# Patient Record
Sex: Male | Born: 1935 | Race: White | Hispanic: No | Smoking: Never smoker
Health system: Southern US, Community
[De-identification: ages and names within clinical notes are randomized; demographics above are authoritative.]

## PROBLEM LIST (undated history)

## (undated) DIAGNOSIS — J449 Chronic obstructive pulmonary disease, unspecified: Secondary | ICD-10-CM

## (undated) DIAGNOSIS — F0280 Dementia in other diseases classified elsewhere without behavioral disturbance: Secondary | ICD-10-CM

## (undated) DIAGNOSIS — I10 Essential (primary) hypertension: Secondary | ICD-10-CM

## (undated) DIAGNOSIS — M1990 Unspecified osteoarthritis, unspecified site: Secondary | ICD-10-CM

## (undated) DIAGNOSIS — K219 Gastro-esophageal reflux disease without esophagitis: Secondary | ICD-10-CM

## (undated) DIAGNOSIS — Z96649 Presence of unspecified artificial hip joint: Secondary | ICD-10-CM

## (undated) HISTORY — PX: JOINT REPLACEMENT: SHX530

## (undated) HISTORY — PX: CHOLECYSTECTOMY: SHX55

## (undated) HISTORY — DX: Chronic obstructive pulmonary disease, unspecified: J44.9

## (undated) HISTORY — DX: Unspecified osteoarthritis, unspecified site: M19.90

## (undated) HISTORY — PX: CATARACT EXTRACTION, BILATERAL: SHX1313

## (undated) HISTORY — DX: Dementia in other diseases classified elsewhere, unspecified severity, without behavioral disturbance, psychotic disturbance, mood disturbance, and anxiety: F02.80

## (undated) HISTORY — DX: Essential (primary) hypertension: I10

## (undated) HISTORY — PX: EYE SURGERY: SHX253

## (undated) HISTORY — PX: TONSILLECTOMY: SUR1361

## (undated) HISTORY — PX: REPLACEMENT TOTAL KNEE: SUR1224

---

## 2001-03-26 ENCOUNTER — Encounter: Payer: Self-pay | Admitting: Emergency Medicine

## 2001-03-26 ENCOUNTER — Emergency Department (HOSPITAL_COMMUNITY): Admission: EM | Admit: 2001-03-26 | Discharge: 2001-03-26 | Payer: Self-pay | Admitting: Emergency Medicine

## 2002-06-14 ENCOUNTER — Emergency Department (HOSPITAL_COMMUNITY): Admission: EM | Admit: 2002-06-14 | Discharge: 2002-06-14 | Payer: Self-pay | Admitting: Emergency Medicine

## 2002-06-16 ENCOUNTER — Emergency Department (HOSPITAL_COMMUNITY): Admission: EM | Admit: 2002-06-16 | Discharge: 2002-06-16 | Payer: Self-pay | Admitting: Emergency Medicine

## 2003-12-08 ENCOUNTER — Ambulatory Visit (HOSPITAL_COMMUNITY): Admission: RE | Admit: 2003-12-08 | Discharge: 2003-12-08 | Payer: Self-pay | Admitting: Orthopedic Surgery

## 2004-01-15 ENCOUNTER — Emergency Department (HOSPITAL_COMMUNITY): Admission: EM | Admit: 2004-01-15 | Discharge: 2004-01-15 | Payer: Self-pay | Admitting: Emergency Medicine

## 2004-05-14 ENCOUNTER — Ambulatory Visit (HOSPITAL_COMMUNITY): Admission: RE | Admit: 2004-05-14 | Discharge: 2004-05-14 | Payer: Self-pay | Admitting: *Deleted

## 2004-05-14 ENCOUNTER — Encounter (INDEPENDENT_AMBULATORY_CARE_PROVIDER_SITE_OTHER): Payer: Self-pay | Admitting: Specialist

## 2005-05-31 ENCOUNTER — Encounter: Admission: RE | Admit: 2005-05-31 | Discharge: 2005-07-22 | Payer: Self-pay | Admitting: Sports Medicine

## 2005-07-20 ENCOUNTER — Ambulatory Visit (HOSPITAL_COMMUNITY): Admission: RE | Admit: 2005-07-20 | Discharge: 2005-07-20 | Payer: Self-pay | Admitting: *Deleted

## 2005-07-20 ENCOUNTER — Encounter (INDEPENDENT_AMBULATORY_CARE_PROVIDER_SITE_OTHER): Payer: Self-pay | Admitting: *Deleted

## 2007-08-14 ENCOUNTER — Encounter (INDEPENDENT_AMBULATORY_CARE_PROVIDER_SITE_OTHER): Payer: Self-pay | Admitting: *Deleted

## 2007-08-14 ENCOUNTER — Ambulatory Visit (HOSPITAL_COMMUNITY): Admission: RE | Admit: 2007-08-14 | Discharge: 2007-08-14 | Payer: Self-pay | Admitting: *Deleted

## 2007-10-23 ENCOUNTER — Ambulatory Visit (HOSPITAL_COMMUNITY): Admission: RE | Admit: 2007-10-23 | Discharge: 2007-10-23 | Payer: Self-pay | Admitting: Internal Medicine

## 2007-11-12 ENCOUNTER — Ambulatory Visit (HOSPITAL_COMMUNITY): Admission: RE | Admit: 2007-11-12 | Discharge: 2007-11-12 | Payer: Self-pay | Admitting: *Deleted

## 2008-09-29 ENCOUNTER — Encounter: Admission: RE | Admit: 2008-09-29 | Discharge: 2008-09-29 | Payer: Self-pay | Admitting: Internal Medicine

## 2008-11-18 ENCOUNTER — Ambulatory Visit (HOSPITAL_COMMUNITY): Admission: RE | Admit: 2008-11-18 | Discharge: 2008-11-18 | Payer: Self-pay | Admitting: General Surgery

## 2008-11-18 ENCOUNTER — Encounter (INDEPENDENT_AMBULATORY_CARE_PROVIDER_SITE_OTHER): Payer: Self-pay | Admitting: General Surgery

## 2011-02-01 LAB — COMPREHENSIVE METABOLIC PANEL
AST: 23 U/L (ref 0–37)
Albumin: 3.5 g/dL (ref 3.5–5.2)
BUN: 6 mg/dL (ref 6–23)
Calcium: 9.2 mg/dL (ref 8.4–10.5)
Chloride: 106 mEq/L (ref 96–112)
Creatinine, Ser: 1.02 mg/dL (ref 0.4–1.5)
GFR calc Af Amer: >60 mL/min (ref >60)
Total Bilirubin: 0.8 mg/dL (ref 0.3–1.2)
Total Protein: 6.3 g/dL (ref 6.0–8.3)

## 2011-02-01 LAB — CBC
MCV: 92.2 fL (ref 78.0–100.0)
Platelets: 282 10*3/uL (ref 150–400)
RDW: 13 % (ref 11.5–15.5)
WBC: 5.9 10*3/uL (ref 4.0–10.5)

## 2011-02-01 LAB — DIFFERENTIAL
Eosinophils Relative: 5 % (ref 0–5)
Lymphocytes Relative: 26 % (ref 12–46)
Lymphs Abs: 1.5 10*3/uL (ref 0.7–4.0)
Monocytes Absolute: 0.8 10*3/uL (ref 0.1–1.0)
Monocytes Relative: 13 % — ABNORMAL HIGH (ref 3–12)
Neutro Abs: 3.3 10*3/uL (ref 1.7–7.7)

## 2011-03-01 NOTE — Op Note (Signed)
Raymond Cuevas, Raymond Cuevas             ACCOUNT NO.:  [id number]   MEDICAL RECORD NO.:  [id number]          PATIENT TYPE:  AMB   LOCATION:  ENDO                         FACILITY:  [facility]   PHYSICIAN:  Georgiana Spinner, M.D.    DATE OF BIRTH:  07-19-1934   DATE OF PROCEDURE:  08/14/2007  DATE OF DISCHARGE:                               OPERATIVE REPORT   PROCEDURES:  Upper endoscopy.   INDICATIONS:  Gastroesophageal reflux disease.   ANESTHESIA:  Fentanyl 75 mcg, Versed 5 mg.   PROCEDURE:  Also ampicillin 2 grams and gentamicin 80 mg were given  prior to the procedure.  With the patient mildly sedated in the left  lateral decubitus position the Pentax videoscopic endoscope was inserted  in mouth and passed under direct vision through the esophagus which  appeared normal until we reached distal esophagus there is appeared to  be a [address] of Barrett's photographed and biopsied.  We entered  into the stomach fundus, body, antrum, duodenal bulb, second portion  duodenum were visualized and appeared normal.  From this point the  endoscope was slowly withdrawn taking circumferential views of duodenal  mucosa until the endoscope been pulled back into stomach, placed in  retroflexion to view the stomach from below.  The endoscope was  straightened and withdrawn taking circumferential views remaining  gastric and esophageal mucosa.  The patient's vital signs, pulse  oximeter remained stable.  The patient tolerated procedure well without  apparent complications.   FINDINGS:  Question of Barrett's esophagus biopsied.  Await biopsy  report.  The patient will call me for results and follow-up with me as  an outpatient.  Proceed to colonoscopy as planned.           ______________________________  Georgiana Spinner, M.D.     GMO/MEDQ  D:  08/14/2007  T:  08/14/2007  Job:  [id number]

## 2011-03-01 NOTE — Op Note (Signed)
NAMEABEM, SHADDIX             ACCOUNT NO.:  [id number]   MEDICAL RECORD NO.:  [id number]          PATIENT TYPE:  AMB   LOCATION:  ENDO                         [facility]ACILITY:  [facility]   PHYSICIAN:  Georgiana Spinner, M.D.    DATE OF BIRTH:  11/03/1933   DATE OF PROCEDURE:  11/12/2007  DATE OF DISCHARGE:                               OPERATIVE REPORT   PROCEDURE:  Upper endoscopy with Savary dilation.   INDICATIONS:  Dysphagia with known esophageal stricture.   ANESTHESIA:  Fentanyl 50 mcg, Versed 5 mg.   PROCEDURE:  With the patient mildly sedated in the left lateral  decubitus position the Pentax videoscopic endoscope was inserted in the  mouth passed under direct vision through the esophagus which appeared  normal until we reached distal esophagus and there were two small  islands of Barrett's esophagus and a distal esophageal stricture noted.  Photographs were taken.  We entered into the stomach through a hiatal  hernia.  Fundus, body, antrum, duodenal bulb, second portion duodenum  were visualized.  From this point the endoscope was slowly withdrawn  taking circumferential views of duodenal mucosa until the endoscope had  been pulled back into stomach and placed in retroflexion to view the  stomach from below.  The endoscope was straightened and withdrawn after  a guidewire was passed, taking circumferential views remaining gastric  and esophageal mucosa.  Subsequently over the guide wire Savary dilators  15, 17, 18 were passed rather easily with fluoroscopic guidance.  No  heme was seen on any of the dilators.  Tith the last the guidewire was  removed.  The endoscope was reinserted.  A small amount of blood was  seen in the stomach.  I am not sure that there was total disruption of  the distal esophageal stricture, however, but at this point I elected to  terminate procedure and the endoscope was withdrawn.  The patient's  vital signs, pulse oximeter remained stable.  The patient  tolerated  procedure well without apparent complications.   FINDINGS:  Two small islands of Barrett's esophagus above esophageal  stricture dilated to 15, 17, 18 Savary.   PLAN:  Await clinical response.  The patient will follow-up with me as  needed as an outpatient.           ______________________________  Georgiana Spinner, M.D.     GMO/MEDQ  D:  11/12/2007  T:  11/12/2007  Job:  [id number]

## 2011-03-01 NOTE — Op Note (Signed)
NAMEOZZIE, KNOBEL             ACCOUNT NO.:  [id number]   MEDICAL RECORD NO.:  [id number]          PATIENT TYPE:  AMB   LOCATION:  DAY                          FACILITY:  [facility]   PHYSICIAN:  Anselm Pancoast. Weatherly, M.D.DATE OF BIRTH:  Jan 24, 1934   DATE OF PROCEDURE:  11/18/2008  DATE OF DISCHARGE:                               OPERATIVE REPORT   PREOPERATIVE DIAGNOSIS:  Chronic cholecystitis with stones.   POSTOPERATIVE DIAGNOSIS:  Chronic cholecystitis with stones.   OPERATION:  Laparoscopic cholecystectomy with cholangiogram.   ANESTHESIA:  General anesthesia.   HISTORY:  Karas Pickerill is a 77 year old Caucasian male who weighs  about 275 pounds, who has had a history of esophageal reflux type  symptoms and then more recently was evaluated by Dr. Renne Crigler and Dr. Virginia Rochester,  his medical physicians and they did an upper endoscopy and lower  colonoscopy approximately a year ago, and then Dr. Virginia Rochester got an ultrasound  of the gallbladder that showed a 1.5 cm have stone within the  gallbladder.  I saw him in the office on October 27, 2008, and he has  had no previous abdominal surgery and on examination besides his weight  of about 275, he has a small defect right at the umbilicus.  I could not  appreciate any type of pedal edema.  He is here for the cholecystectomy.   DESCRIPTION OF PROCEDURE:  The patient was taken to the operative suite.  He had been given 3 grams of Unasyn.  He has PAS stockings.  The abdomen  was prepped with Betadine surgical scrubbing and solution and then  draped in a sterile manner.  A small incision below the umbilicus right  at the edge of the fascial defect, he has about a penny size fascial  defect at the umbilicus, so that I could actually go through this trocar  site without actually enlarging the fascia anymore.  The little  preperitoneal fascia that was herniated up was kind of reduced, and then  I put traction sutures in the lateral aspect of the fascia  and then used  just a little finger dissection and put the 10-mm Hassan trocar in and  attached the sutures to keep the CO2 in.  The camera was then inserted,  it was very long.  He is large and heavy, but the straight scope could  be pushed up to the hilt and we could get in a position that we could  safely do his cholecystectomy.  The upper 10 mL trocar was placed in the  subxiphoid area to the right side of the falciform ligaments and the two  lateral 5 mm trocars were placed in the appropriate position without Dr.  Jamey Ripa, the assistant.  The gallbladder could be retracted upward.  When  I kind of opened up the peritoneum at the proximal portion of  gallbladder, I was able to mobilize the little cystic duct that was  clipped flush with the gallbladder.  A little opening made a Cook  catheter introduced, held in place with clip and then a cholangiogram  obtained.  He has got good prompt filling of  the extrahepatic biliary  system, the intrahepatic and good flow into the duodenum.  I removed the  catheter, triply clipped the cystic duct and then divided in.  The  little cystic artery appeared to be fairly small, but it was doubly  clipped proximally, singly distally and divided.  Then if there was a  posterior branch of the cystic artery, I clipped it sort of blindly up  right at the peritoneal reflection edge.  Then using a hook and then  switching to the spatula electrocautery, separated the gallbladder from  its fossa.  It was placed in an EndoCatch bag.  There was good  hemostasis.  Then, the camera switched to the upper 10 mL trocar and  then the bag containing the gallbladder was grasped and brought out  through the fascial defect.  The fascia was closed transversely with  interrupted sutures of 0 Prolene, and I placed about four sutures in the  small defect and anesthetized the fascia.  The subcutaneous tissue was  kind of mobilized and closed with 4-0 Vicryl, and then the upper 5  mm  ports withdrawn after a little irrigating fluid had been aspirated and  then the upper 10 mm trocar withdrawn after the CO2 had been  released.  The patient tolerated the procedure nicely.  The skin with  Benzoin and Steri-Strips.  The patient was sent to recovery room in  stable postop condition.  The patient would like to be discharged today  providing that he is not nauseous, and we will make the call in the  recovery room.           ______________________________  Anselm Pancoast. Zachery Dakins, M.D.     WJW/MEDQ  D:  11/18/2008  T:  11/18/2008  Job:  [id number]   cc:   Georgiana Spinner, M.D.  Fax: [phone]

## 2011-03-01 NOTE — Op Note (Signed)
NAMEKARLON, SCHLAFER             ACCOUNT NO.:  [id number]   MEDICAL RECORD NO.:  [id number]          PATIENT TYPE:  AMB   LOCATION:  ENDO                         FACILITY:  [facility]   PHYSICIAN:  Georgiana Spinner, M.D.    DATE OF BIRTH:  04-02-1934   DATE OF PROCEDURE:  08/14/2007  DATE OF DISCHARGE:                               OPERATIVE REPORT   PROCEDURE:  Colonoscopy with polypectomy and biopsy.   INDICATIONS:  Colon polyps.   ANESTHESIA:  Fentanyl 25 mcg, Versed 4 mg.   PROCEDURE:  With the patient mildly sedated in the left lateral  decubitus position the Pentax videoscopic colonoscope was inserted in  the rectum after a rectal examination was performed which was normal to  my examination.  The colonoscope was passed under direct vision to the  cecum, identified by ileocecal valve and appendiceal orifice both of  which were photographed.  From this point the colonoscope was slowly  withdrawn, taking circumferential views of colonic mucosa, stopping  first in the hepatic flexure where a polyp was seen, photographed and  removed using hot biopsy forceps technique at a setting of 20/150  blended current.  We next stopped in the descending colon where a polyp  was seen, photographed and removed using snare cautery technique at a  setting of the same 20/150 blended current.  We then stopped at 25 cm  from the anal verge at which point two more polyps were seen,  photographed and removed again using snare cautery technique with the  same setting.  All the tissue was retained for pathology.  The endoscope  was withdrawn to the rectum which appeared normal on direct and  retroflexed view.  The endoscope was straightened and withdrawn.  The  patient's vital signs, pulse oximeter remained stable.  The patient  tolerated procedure well without apparent complications.   FINDINGS:  Polyp of hepatic flexure, descending colon and at 25 cm from  the anal verge.   PLAN:  Await biopsy report.   The patient will call me for results and  follow-up with me as an outpatient.           ______________________________  Georgiana Spinner, M.D.     GMO/MEDQ  D:  08/14/2007  T:  08/14/2007  Job:  [id number]

## 2011-03-04 NOTE — Op Note (Signed)
Raymond Cuevas, Raymond Cuevas             ACCOUNT NO.:  [id number]   MEDICAL RECORD NO.:  [id number]          PATIENT TYPE:  AMB   LOCATION:  ENDO                         FACILITY:  [facility]   PHYSICIAN:  Georgiana Spinner, M.D.    DATE OF BIRTH:  [birth year]   DATE OF PROCEDURE:  DATE OF DISCHARGE:                                 OPERATIVE REPORT   PROCEDURE:  Upper endoscopy.   INDICATIONS:  Hemoccult positivity.   ANESTHESIA:  Demerol 50, versed 5 mg.   PROCEDURE:  With the patient mildly sedated and in the left lateral  decubitus position, the Olympus videoscopic endoscopic was inserted in the  mouth and passed under direct vision through the esophagus, which appeared  normal, into the stomach.  The fundus, body, antrum, duodenal bulb, and  second portion of the duodenum appeared normal from this point.  The  endoscope was slowly withdrawn, taking circumferential views of the mucosa  until the endoscope had been pulled back into the stomach.  It was placed in  retroflexion to view the stomach from below.  The endoscope was then  straightened and withdrawn, taking circumferential views of the remaining  gastric and esophageal mucosa.  The patient's vital signs and pulse remained  stable.  The patient tolerated the procedure well without apparent  complications.   FINDINGS:  Unremarkable exam.   PLAN:  Proceed to colonoscopy.           ______________________________  Georgiana Spinner, M.D.     GMO/MEDQ  D:  07/20/2005  T:  07/20/2005  Job:  [id number]

## 2011-03-04 NOTE — Op Note (Signed)
NAME:  Raymond Cuevas, Raymond Cuevas                       ACCOUNT NO.:  [id number]   MEDICAL RECORD NO.:  [id number]                   PATIENT TYPE:  AMB   LOCATION:  ENDO                                 FACILITY:  [facility]   PHYSICIAN:  Georgiana Spinner, M.D.                 DATE OF BIRTH:  [birth year]   DATE OF PROCEDURE:  DATE OF DISCHARGE:                                 OPERATIVE REPORT   PROCEDURE:  Upper endoscopy with dilation and biopsy.   INDICATIONS FOR PROCEDURE:  Dysphagia with reflux.   ANESTHESIA:  Demerol 80, Versed 8 mg.   DESCRIPTION OF PROCEDURE:  With the patient mildly sedated in the left  lateral decubitus position, the Olympus videoscopic endoscope was inserted  in the mouth and passed under direct vision through the esophagus which  appeared normal, question of Barrett's seen in short segments but we could  not get the esophagus to open well to see that certainly. We photographed it  only at first, advanced into the stomach, fundus,  body, antrum, duodenal  bulb and second portion of the duodenum appeared normal. From this point,  the endoscope was slowly withdrawn taking circumferential views of the  entire duodenal mucosa until the endoscope was then pulled back in the  stomach, placed in retroflexion to view the stomach from below. The  endoscope was then straightened and a guidewire was passed. The endoscope  was withdraw.  Subsequently Savary dilators of 15, 17 and 18 were passed  rather easily. With the latter, the guidewire was removed.  The endoscope  was reinserted, no bleeding or abnormalities were noted. We then once again  viewed the distal esophagus and took biopsies of this area. The endoscope  was then withdrawn taking circumferential views of the remaining gastric and  esophageal mucosa. The patient's vital signs and pulse oximeter remained  stable. The patient tolerated the procedure well without apparent  complications.   FINDINGS:  Question of short  segment Barrett's esophagus biopsied, dilation  of esophagus to 15, 17 and 18 Savary.   PLAN:  Await clinical response. The patient will call me for results of  biopsy and followup with me as an outpatient.                                               Georgiana Spinner, M.D.    GMO/MEDQ  D:  05/14/2004  T:  05/14/2004  Job:  [id number]

## 2011-03-04 NOTE — Op Note (Signed)
NAMELORIS, WINROW             ACCOUNT NO.:  [id number]   MEDICAL RECORD NO.:  [id number]          PATIENT TYPE:  AMB   LOCATION:  ENDO                         [facility]ACILITY:  [facility]   PHYSICIAN:  Georgiana Spinner, M.D.    DATE OF BIRTH:  Sep 03, 1934   DATE OF PROCEDURE:  07/20/2005  DATE OF DISCHARGE:                                 OPERATIVE REPORT   PROCEDURE:  Colonoscopy.   INDICATIONS:  Hemoccult positivity.   ANESTHESIA:  Demerol 20, Versed 1 mg.   DESCRIPTION OF PROCEDURE:  With the patient mildly sedated in the left  lateral decubitus position, the Olympus videoscopic colonoscope was inserted  in the rectum after a normal rectal exam and passed under direct vision to  the cecum identified by the ileocecal valve and appendiceal orifice. From  this point, colonoscope was slowly withdrawn taking circumferential views of  the colonic mucosa stopping in the ascending colon where a polyp was seen,  photographed and removed using snare cautery technique and then hot biopsy  forceps technique setting of 20/200. Tissue was retrieved for pathology by  pulling it into the endoscope with the hot biopsy forceps. The endoscope was  then withdrawn all the way to the rectum which appeared normal on direct and  retroflexed viewing. The endoscope was straightened and withdrawn. The  patient's vital signs and pulse oximeter remained stable. The patient  tolerated the procedure well without apparent complications.   FINDINGS:  Small polyp of ascending colon, otherwise unremarkable exam.   PLAN:  Await biopsy report. The patient will call me for results and follow-  up with me as an outpatient.           ______________________________  Georgiana Spinner, M.D.     GMO/MEDQ  D:  07/20/2005  T:  07/20/2005  Job:  [id number]

## 2011-03-29 ENCOUNTER — Other Ambulatory Visit: Payer: Self-pay | Admitting: Gastroenterology

## 2011-12-07 DIAGNOSIS — M7989 Other specified soft tissue disorders: Secondary | ICD-10-CM | POA: Diagnosis not present

## 2011-12-07 DIAGNOSIS — M79609 Pain in unspecified limb: Secondary | ICD-10-CM | POA: Diagnosis not present

## 2011-12-15 DIAGNOSIS — M25569 Pain in unspecified knee: Secondary | ICD-10-CM | POA: Diagnosis not present

## 2011-12-15 DIAGNOSIS — M1610 Unilateral primary osteoarthritis, unspecified hip: Secondary | ICD-10-CM | POA: Diagnosis not present

## 2011-12-15 DIAGNOSIS — M12269 Villonodular synovitis (pigmented), unspecified knee: Secondary | ICD-10-CM | POA: Diagnosis not present

## 2011-12-15 DIAGNOSIS — M25559 Pain in unspecified hip: Secondary | ICD-10-CM | POA: Diagnosis not present

## 2011-12-19 DIAGNOSIS — R42 Dizziness and giddiness: Secondary | ICD-10-CM | POA: Diagnosis not present

## 2011-12-28 DIAGNOSIS — M25569 Pain in unspecified knee: Secondary | ICD-10-CM | POA: Diagnosis not present

## 2011-12-28 DIAGNOSIS — M12269 Villonodular synovitis (pigmented), unspecified knee: Secondary | ICD-10-CM | POA: Diagnosis not present

## 2011-12-28 DIAGNOSIS — M1610 Unilateral primary osteoarthritis, unspecified hip: Secondary | ICD-10-CM | POA: Diagnosis not present

## 2011-12-28 DIAGNOSIS — M25559 Pain in unspecified hip: Secondary | ICD-10-CM | POA: Diagnosis not present

## 2012-01-04 DIAGNOSIS — M1610 Unilateral primary osteoarthritis, unspecified hip: Secondary | ICD-10-CM | POA: Diagnosis not present

## 2012-01-04 DIAGNOSIS — M1710 Unilateral primary osteoarthritis, unspecified knee: Secondary | ICD-10-CM | POA: Diagnosis not present

## 2012-01-23 DIAGNOSIS — M25559 Pain in unspecified hip: Secondary | ICD-10-CM | POA: Diagnosis not present

## 2012-01-23 DIAGNOSIS — M1610 Unilateral primary osteoarthritis, unspecified hip: Secondary | ICD-10-CM | POA: Diagnosis not present

## 2012-08-08 DIAGNOSIS — Z23 Encounter for immunization: Secondary | ICD-10-CM | POA: Diagnosis not present

## 2013-03-15 DIAGNOSIS — J019 Acute sinusitis, unspecified: Secondary | ICD-10-CM | POA: Diagnosis not present

## 2013-04-05 ENCOUNTER — Emergency Department (HOSPITAL_COMMUNITY)
Admission: EM | Admit: 2013-04-05 | Discharge: 2013-04-05 | Disposition: A | Discharge status: Home / Self Care | Payer: Medicare Other | Attending: Emergency Medicine | Admitting: Emergency Medicine

## 2013-04-05 ENCOUNTER — Encounter (HOSPITAL_COMMUNITY): Payer: Self-pay | Admitting: *Deleted

## 2013-04-05 DIAGNOSIS — Z791 Long term (current) use of non-steroidal anti-inflammatories (NSAID): Secondary | ICD-10-CM | POA: Insufficient documentation

## 2013-04-05 DIAGNOSIS — Y9289 Other specified places as the place of occurrence of the external cause: Secondary | ICD-10-CM | POA: Insufficient documentation

## 2013-04-05 DIAGNOSIS — T161XXA Foreign body in right ear, initial encounter: Secondary | ICD-10-CM

## 2013-04-05 DIAGNOSIS — Y9389 Activity, other specified: Secondary | ICD-10-CM | POA: Insufficient documentation

## 2013-04-05 DIAGNOSIS — Z8669 Personal history of other diseases of the nervous system and sense organs: Secondary | ICD-10-CM | POA: Diagnosis not present

## 2013-04-05 DIAGNOSIS — IMO0002 Reserved for concepts with insufficient information to code with codable children: Secondary | ICD-10-CM | POA: Insufficient documentation

## 2013-04-05 DIAGNOSIS — T169XXA Foreign body in ear, unspecified ear, initial encounter: Secondary | ICD-10-CM | POA: Diagnosis not present

## 2013-04-05 NOTE — ED Provider Notes (Signed)
History  This chart was scribed for Ebbie Ridge, PA-C by Ladona Ridgel Day, ED scribe. This patient was seen in room WTR8/WTR8 and the patient's care was started at 1959.   CSN: [id number]  Arrival date & time 04/05/13  1959   First MD Initiated Contact with Patient 04/05/13 2129      Chief Complaint  Patient presents with  . Foreign Body in Ear   The history is provided by the patient. No language interpreter was used.   HPI Comments: Raymond Cuevas is a 79 y.o. male who presents to the Emergency Department complaining of small foreign body is his right ear since 4 PM today. He states a piece of his hearing aid broke off and is now stuck in his right ear. He states this has not happened before and he denies any discomfort or pain to his ear at this time.   Past Medical History  Diagnosis Date  . Deaf     Past Surgical History  Procedure Laterality Date  . Joint replacement Right     Knee    History reviewed. No pertinent family history.  History  Substance Use Topics  . Smoking status: Never Smoker   . Smokeless tobacco: Not on file  . Alcohol Use: Yes     Comment: rarely      Review of Systems  Constitutional: Negative for fever and chills.  HENT: Negative for ear pain.        Foreign body in his right ear  Respiratory: Negative for shortness of breath.   Gastrointestinal: Negative for nausea and vomiting.  Neurological: Negative for weakness.  All other systems reviewed and are negative.   A complete 10 system review of systems was obtained and all systems are negative except as noted in the HPI and PMH.   Allergies  Codeine and Morphine and related  Home Medications   Current Outpatient Rx  Name  Route  Sig  Dispense  Refill  . naproxen sodium (ANAPROX) 220 MG tablet   Oral   Take 440 mg by mouth daily.           Triage Vitals: BP 133/67  Pulse 65  Temp(Src) 98.4 F (36.9 C) (Oral)  Resp 20  Wt 261 lb (118.389 kg)  SpO2 95%  Physical Exam   Nursing note and vitals reviewed. Constitutional: He is oriented to person, place, and time. He appears well-developed and well-nourished.  HENT:  Right Ear: A foreign body is present.  Eyes: Pupils are equal, round, and reactive to light.  Cardiovascular: Normal rate and regular rhythm.   Pulmonary/Chest: Effort normal.  Neurological: He is alert and oriented to person, place, and time.  Skin: Skin is warm and dry.    ED Course  Procedures (including critical care time) DIAGNOSTIC STUDIES: Oxygen Saturation is 95% on room air, adequate by my interpretation.    COORDINATION OF CARE: At 935 PM Discussed treatment plan with patient which includes foreign body retrieval w/forceps. Patient agrees.   Foreign body removed from R ear canal with alligator forceps  MDM  I personally performed the services described in this documentation, which was scribed in my presence. The recorded information has been reviewed and is accurate.         Carlyle Dolly, PA-C 04/05/13 2210

## 2013-04-05 NOTE — ED Notes (Signed)
Pt has hearing aid broken in R ear.Pt c/o pain associated w/ this.

## 2013-04-05 NOTE — ED Provider Notes (Signed)
Medical screening examination/treatment/procedure(s) were performed by non-physician practitioner and as supervising physician I was immediately available for consultation/collaboration.  Anani Gu K Linker, MD 04/05/13 2237 

## 2013-06-21 DIAGNOSIS — M25569 Pain in unspecified knee: Secondary | ICD-10-CM | POA: Diagnosis not present

## 2013-06-21 DIAGNOSIS — M169 Osteoarthritis of hip, unspecified: Secondary | ICD-10-CM | POA: Diagnosis not present

## 2013-06-25 ENCOUNTER — Other Ambulatory Visit (HOSPITAL_COMMUNITY): Payer: Self-pay | Admitting: Orthopedic Surgery

## 2013-06-25 DIAGNOSIS — M25561 Pain in right knee: Secondary | ICD-10-CM

## 2013-06-27 ENCOUNTER — Encounter (HOSPITAL_COMMUNITY): Payer: Medicare Other

## 2013-06-27 ENCOUNTER — Encounter (HOSPITAL_COMMUNITY)
Admission: RE | Admit: 2013-06-27 | Discharge: 2013-06-27 | Disposition: A | Discharge status: Home / Self Care | Payer: Medicare Other | Source: Ambulatory Visit | Attending: Orthopedic Surgery | Admitting: Orthopedic Surgery

## 2013-06-27 DIAGNOSIS — M25569 Pain in unspecified knee: Secondary | ICD-10-CM | POA: Insufficient documentation

## 2013-06-27 DIAGNOSIS — Z96659 Presence of unspecified artificial knee joint: Secondary | ICD-10-CM | POA: Diagnosis not present

## 2013-06-27 DIAGNOSIS — M1710 Unilateral primary osteoarthritis, unspecified knee: Secondary | ICD-10-CM | POA: Diagnosis not present

## 2013-06-27 DIAGNOSIS — M25561 Pain in right knee: Secondary | ICD-10-CM

## 2013-06-27 MED ORDER — TECHNETIUM TC 99M MEDRONATE IV KIT
27.0000 | PACK | Freq: Once | INTRAVENOUS | Status: AC | PRN
  Administered 2013-06-27: 27 via INTRAVENOUS

## 2013-07-02 DIAGNOSIS — T169XXA Foreign body in ear, unspecified ear, initial encounter: Secondary | ICD-10-CM | POA: Diagnosis not present

## 2013-07-09 DIAGNOSIS — J309 Allergic rhinitis, unspecified: Secondary | ICD-10-CM | POA: Diagnosis not present

## 2013-07-09 DIAGNOSIS — Z01818 Encounter for other preprocedural examination: Secondary | ICD-10-CM | POA: Diagnosis not present

## 2013-07-16 DIAGNOSIS — J309 Allergic rhinitis, unspecified: Secondary | ICD-10-CM | POA: Diagnosis not present

## 2013-07-24 DIAGNOSIS — M169 Osteoarthritis of hip, unspecified: Secondary | ICD-10-CM | POA: Diagnosis not present

## 2013-08-13 ENCOUNTER — Encounter (HOSPITAL_COMMUNITY): Payer: Self-pay | Admitting: Pharmacy Technician

## 2013-08-21 ENCOUNTER — Encounter (HOSPITAL_COMMUNITY): Payer: Self-pay

## 2013-08-21 ENCOUNTER — Encounter (HOSPITAL_COMMUNITY)
Admission: RE | Admit: 2013-08-21 | Discharge: 2013-08-21 | Disposition: A | Discharge status: Home / Self Care | Payer: Medicare Other | Source: Ambulatory Visit | Attending: Orthopedic Surgery | Admitting: Orthopedic Surgery

## 2013-08-21 DIAGNOSIS — Z01818 Encounter for other preprocedural examination: Secondary | ICD-10-CM | POA: Diagnosis not present

## 2013-08-21 DIAGNOSIS — Z01812 Encounter for preprocedural laboratory examination: Secondary | ICD-10-CM | POA: Insufficient documentation

## 2013-08-21 HISTORY — DX: Unspecified osteoarthritis, unspecified site: M19.90

## 2013-08-21 HISTORY — DX: Gastro-esophageal reflux disease without esophagitis: K21.9

## 2013-08-21 LAB — URINALYSIS, ROUTINE W REFLEX MICROSCOPIC
Bilirubin Urine: NEGATIVE
Ketones, ur: NEGATIVE mg/dL
Leukocytes, UA: NEGATIVE
Nitrite: NEGATIVE
Protein, ur: NEGATIVE mg/dL
Specific Gravity, Urine: 1.013 (ref 1.005–1.030)
Urobilinogen, UA: 0.2 mg/dL (ref 0.0–1.0)
pH: 6.5 (ref 5.0–8.0)

## 2013-08-21 LAB — BASIC METABOLIC PANEL
BUN: 10 mg/dL (ref 6–23)
Calcium: 10.2 mg/dL (ref 8.4–10.5)
Creatinine, Ser: 1.19 mg/dL (ref 0.50–1.35)
GFR calc Af Amer: 66 mL/min — ABNORMAL LOW (ref >90)
GFR calc non Af Amer: 57 mL/min — ABNORMAL LOW (ref >90)
Glucose, Bld: 100 mg/dL — ABNORMAL HIGH (ref 70–99)

## 2013-08-21 LAB — CBC
Hemoglobin: 16.7 g/dL (ref 13.0–17.0)
MCHC: 34.4 g/dL (ref 30.0–36.0)
Platelets: 372 10*3/uL (ref 150–400)
RDW: 12.6 % (ref 11.5–15.5)
WBC: 8 10*3/uL (ref 4.0–10.5)

## 2013-08-21 LAB — APTT: aPTT: 30 seconds (ref 24–37)

## 2013-08-21 LAB — PROTIME-INR
INR: 1 (ref 0.00–1.49)
Prothrombin Time: 13 seconds (ref 11.6–15.2)

## 2013-08-21 LAB — SURGICAL PCR SCREEN: MRSA, PCR: NEGATIVE

## 2013-08-21 NOTE — H&P (Signed)
TOTAL HIP ADMISSION H&P  Patient is admitted for right total hip arthroplasty, anterior approach.  Subjective:  Chief Complaint: Right hip OA / pain  HPI: Raymond Cuevas, 79 y.o. male, has a history of pain and functional disability in the right hip(s) due to arthritis and patient has failed non-surgical conservative treatments for greater than 12 weeks to include NSAID's and/or analgesics, corticosteriod injections, use of assistive devices and activity modification.  Onset of symptoms was gradual starting 8+ years ago with gradually worsening course since that time.The patient noted no past surgery on the right hip(s).  Patient currently rates pain in the right hip at 10 out of 10 with activity. Patient has night pain, worsening of pain with activity and weight bearing, trendelenberg gait, pain that interfers with activities of daily living and pain with passive range of motion. Patient has evidence of periarticular osteophytes and joint space narrowing by imaging studies. This condition presents safety issues increasing the risk of falls.   There is no current active infection.  Risks, benefits and expectations were discussed with the patient. Patient understand the risks, benefits and expectations and wishes to proceed with surgery.   D/C Plans:   Home with HHPT  Post-op Meds:    No Rx given  Tranexamic Acid:   To be given  Decadron:    To be given  FYI:    ASA post-op    Past Medical History  Diagnosis Date  . GERD (gastroesophageal reflux disease)     occ.  . Arthritis     osteoarthritis    Past Surgical History  Procedure Laterality Date  . Joint replacement Right     Knee  . Cholecystectomy      laparoscopic  . Eye surgery Left     hole repair  . Cataract extraction, bilateral Bilateral   . Tonsillectomy      No prescriptions prior to admission   Allergies  Allergen Reactions  . Codeine Other (See Comments)    "Walking the walls"  . Morphine And Related Other  (See Comments)    "Walking the walls"  . Sulfa Antibiotics Rash    History  Substance Use Topics  . Smoking status: Never Smoker   . Smokeless tobacco: Not on file  . Alcohol Use: 0.6 oz/week    1 Cans of beer per week     Comment: rarely      Review of Systems  Constitutional: Negative.   HENT: Positive for hearing loss.   Eyes: Negative.   Respiratory: Negative.   Cardiovascular: Negative.   Gastrointestinal: Positive for heartburn.  Genitourinary: Negative.   Musculoskeletal: Positive for joint pain.  Skin: Negative.   Neurological: Negative.   Endo/Heme/Allergies: Negative.   Psychiatric/Behavioral: Negative.     Objective:  Physical Exam  Constitutional: He is oriented to person, place, and time. He appears well-developed and well-nourished.  HENT:  Head: Normocephalic and atraumatic.  Mouth/Throat: Oropharynx is clear and moist.  Eyes: Pupils are equal, round, and reactive to light.  Neck: Neck supple. No JVD present. No tracheal deviation present. No thyromegaly present.  Cardiovascular: Normal rate, regular rhythm, normal heart sounds and intact distal pulses.   Respiratory: Effort normal and breath sounds normal. No respiratory distress. He has no wheezes.  GI: Soft. There is no tenderness. There is no guarding.  Musculoskeletal:       Right hip: He exhibits decreased range of motion, decreased strength, tenderness and bony tenderness. He exhibits no swelling, no deformity and  no laceration.  Lymphadenopathy:    He has no cervical adenopathy.  Neurological: He is alert and oriented to person, place, and time.  Skin: Skin is warm and dry.  Psychiatric: He has a normal mood and affect.    Vital signs in last 24 hours: Temp:  [97.7 F (36.5 C)] 97.7 F (36.5 C) (11/05 1201) Pulse Rate:  [67] 67 (11/05 1201) Resp:  [18] 18 (11/05 1201) BP: (127)/(72) 127/72 mmHg (11/05 1201) SpO2:  [95 %] 95 % (11/05 1201) Weight:  [121.564 kg (268 lb)] 121.564 kg (268  lb) (11/05 1201)   Imaging Review Plain radiographs demonstrate severe degenerative joint disease of the right hip(s). The bone quality appears to be good for age and reported activity level.  Assessment/Plan:  End stage arthritis, right hip(s)  The patient history, physical examination, clinical judgement of the provider and imaging studies are consistent with end stage degenerative joint disease of the right hip(s) and total hip arthroplasty is deemed medically necessary. The treatment options including medical management, injection therapy, arthroscopy and arthroplasty were discussed at length. The risks and benefits of total hip arthroplasty were presented and reviewed. The risks due to aseptic loosening, infection, stiffness, dislocation/subluxation,  thromboembolic complications and other imponderables were discussed.  The patient acknowledged the explanation, agreed to proceed with the plan and consent was signed. Patient is being admitted for inpatient treatment for surgery, pain control, PT, OT, prophylactic antibiotics, VTE prophylaxis, progressive ambulation and ADL's and discharge planning.The patient is planning to be discharged home with home health services.    Anastasio Auerbach Jerick Khachatryan   PAC  08/21/2013, 6:03 PM

## 2013-08-21 NOTE — Patient Instructions (Addendum)
20 TANIS BURNLEY  08/21/2013   Your procedure is scheduled on: 11-11  -2014  Report to [facility] at      2:30  PM.  Call this number if you have problems the morning of surgery: [phone]  Or Presurgical Testing [phone](Mccartney Brucks)      Do not eat food:After Midnight.  May have clear liquids:up to 6 Hours before arrival. Nothing after : 1100 am  Clear liquids include soda, tea, black coffee, apple or grape juice, broth.  Take these medicines the morning of surgery with A SIP OF WATER: Omeprazole(if needed)   Do not wear jewelry, make-up or nail polish.  Do not wear lotions, powders, or perfumes. You may wear deodorant.  Do not shave 12 hours prior to first CHG shower(legs and under arms).(face and neck okay.)  Do not bring valuables to the hospital.  Contacts, dentures or removable bridgework, body piercing, hair pins may not be worn into surgery.  Leave suitcase in the car. After surgery it may be brought to your room.  For patients admitted to the hospital, checkout time is 11:00 AM the day of discharge.   Patients discharged the day of surgery will not be allowed to drive home. Must have responsible person with you x 24 hours once discharged.  Name and phone number of your driver: will arrange  Special Instructions: CHG(Chlorhedine 4%-"Hibiclens","Betasept","Aplicare") Shower Use Special Wash: see special instructions.(avoid face and genitals)   Please read over the following fact sheets that you were given: MRSA Information, Blood Transfusion fact sheet, Incentive Spirometry Instruction.  Remember : Type/Screen "Blue armbands" - may not be removed once applied(would result in being retested if removed).  Failure to follow these instructions may result in Cancellation of your surgery.   Patient signature_______________________________________________________

## 2013-08-22 NOTE — Pre-Procedure Instructions (Signed)
08-21-13 Ekg requested from Dr. Renne Crigler. 08-22-13 EKG 07-09-13 -report with chart.

## 2013-08-26 MED ORDER — DEXTROSE 5 % IV SOLN
3.0000 g | INTRAVENOUS | Status: AC
  Administered 2013-08-27: 3 g via INTRAVENOUS
  Filled 2013-08-26 (×2): qty 3000

## 2013-08-26 NOTE — Progress Notes (Signed)
Notified patient of time change- no food after midnight, may have clear liquids until 0830 am, then none

## 2013-08-27 ENCOUNTER — Inpatient Hospital Stay (HOSPITAL_COMMUNITY): Payer: Medicare Other | Admitting: Anesthesiology

## 2013-08-27 ENCOUNTER — Encounter (HOSPITAL_COMMUNITY): Payer: Medicare Other | Admitting: Anesthesiology

## 2013-08-27 ENCOUNTER — Encounter (HOSPITAL_COMMUNITY)
Admission: RE | Disposition: A | Discharge status: Home Health | Payer: Self-pay | Source: Ambulatory Visit | Attending: Orthopedic Surgery

## 2013-08-27 ENCOUNTER — Inpatient Hospital Stay (HOSPITAL_COMMUNITY): Payer: Medicare Other

## 2013-08-27 ENCOUNTER — Inpatient Hospital Stay (HOSPITAL_COMMUNITY)
Admission: RE | Admit: 2013-08-27 | Discharge: 2013-08-29 | DRG: 470 | Disposition: A | Discharge status: Home Health | Payer: Medicare Other | Source: Ambulatory Visit | Attending: Orthopedic Surgery | Admitting: Orthopedic Surgery

## 2013-08-27 ENCOUNTER — Encounter (HOSPITAL_COMMUNITY): Payer: Self-pay | Admitting: *Deleted

## 2013-08-27 DIAGNOSIS — E669 Obesity, unspecified: Secondary | ICD-10-CM | POA: Diagnosis not present

## 2013-08-27 DIAGNOSIS — Z7982 Long term (current) use of aspirin: Secondary | ICD-10-CM

## 2013-08-27 DIAGNOSIS — Z471 Aftercare following joint replacement surgery: Secondary | ICD-10-CM | POA: Diagnosis not present

## 2013-08-27 DIAGNOSIS — Z882 Allergy status to sulfonamides status: Secondary | ICD-10-CM

## 2013-08-27 DIAGNOSIS — Z9849 Cataract extraction status, unspecified eye: Secondary | ICD-10-CM | POA: Diagnosis not present

## 2013-08-27 DIAGNOSIS — E6609 Other obesity due to excess calories: Secondary | ICD-10-CM | POA: Diagnosis present

## 2013-08-27 DIAGNOSIS — Z96659 Presence of unspecified artificial knee joint: Secondary | ICD-10-CM | POA: Diagnosis not present

## 2013-08-27 DIAGNOSIS — Z79899 Other long term (current) drug therapy: Secondary | ICD-10-CM | POA: Diagnosis not present

## 2013-08-27 DIAGNOSIS — H9190 Unspecified hearing loss, unspecified ear: Secondary | ICD-10-CM | POA: Diagnosis not present

## 2013-08-27 DIAGNOSIS — Z6833 Body mass index (BMI) 33.0-33.9, adult: Secondary | ICD-10-CM

## 2013-08-27 DIAGNOSIS — M169 Osteoarthritis of hip, unspecified: Secondary | ICD-10-CM | POA: Diagnosis not present

## 2013-08-27 DIAGNOSIS — Z9089 Acquired absence of other organs: Secondary | ICD-10-CM | POA: Diagnosis not present

## 2013-08-27 DIAGNOSIS — Z888 Allergy status to other drugs, medicaments and biological substances status: Secondary | ICD-10-CM

## 2013-08-27 DIAGNOSIS — M1610 Unilateral primary osteoarthritis, unspecified hip: Principal | ICD-10-CM | POA: Diagnosis present

## 2013-08-27 DIAGNOSIS — K219 Gastro-esophageal reflux disease without esophagitis: Secondary | ICD-10-CM | POA: Diagnosis present

## 2013-08-27 DIAGNOSIS — Z96649 Presence of unspecified artificial hip joint: Secondary | ICD-10-CM

## 2013-08-27 HISTORY — DX: Presence of unspecified artificial hip joint: Z96.649

## 2013-08-27 HISTORY — PX: TOTAL HIP ARTHROPLASTY: SHX124

## 2013-08-27 LAB — TYPE AND SCREEN
ABO/RH(D): A NEG
Antibody Screen: NEGATIVE

## 2013-08-27 SURGERY — ARTHROPLASTY, HIP, TOTAL, ANTERIOR APPROACH
Anesthesia: Spinal | Site: Hip | Laterality: Right | Wound class: Clean

## 2013-08-27 MED ORDER — PROMETHAZINE HCL 25 MG/ML IJ SOLN: 6.2500 mg | INTRAMUSCULAR | Status: DC | PRN

## 2013-08-27 MED ORDER — ONDANSETRON HCL 4 MG/2ML IJ SOLN: 4.0000 mg | Freq: Four times a day (QID) | INTRAMUSCULAR | Status: DC | PRN

## 2013-08-27 MED ORDER — ALUM & MAG HYDROXIDE-SIMETH 200-200-20 MG/5ML PO SUSP
30.0000 mL | ORAL | Status: DC | PRN
  Administered 2013-08-28: 30 mL via ORAL
  Filled 2013-08-27: qty 30

## 2013-08-27 MED ORDER — BISACODYL 10 MG RE SUPP: 10.0000 mg | Freq: Every day | RECTAL | Status: DC | PRN

## 2013-08-27 MED ORDER — PROPOFOL 10 MG/ML IV BOLUS
INTRAVENOUS | Status: DC | PRN
  Administered 2013-08-27: 30 mg via INTRAVENOUS

## 2013-08-27 MED ORDER — BUPIVACAINE HCL (PF) 0.5 % IJ SOLN
INTRAMUSCULAR | Status: AC
  Filled 2013-08-27: qty 30

## 2013-08-27 MED ORDER — DIPHENHYDRAMINE HCL 25 MG PO CAPS: 25.0000 mg | ORAL_CAPSULE | Freq: Four times a day (QID) | ORAL | Status: DC | PRN

## 2013-08-27 MED ORDER — MENTHOL 3 MG MT LOZG: 1.0000 | LOZENGE | OROMUCOSAL | Status: DC | PRN

## 2013-08-27 MED ORDER — PROPOFOL INFUSION 10 MG/ML OPTIME
INTRAVENOUS | Status: DC | PRN
  Administered 2013-08-27: 100 ug/kg/min via INTRAVENOUS

## 2013-08-27 MED ORDER — STERILE WATER FOR IRRIGATION IR SOLN
Status: DC | PRN
  Administered 2013-08-27: 3000 mL

## 2013-08-27 MED ORDER — FERROUS SULFATE 325 (65 FE) MG PO TABS
325.0000 mg | ORAL_TABLET | Freq: Three times a day (TID) | ORAL | Status: DC
  Administered 2013-08-28 – 2013-08-29 (×3): 325 mg via ORAL
  Filled 2013-08-27 (×7): qty 1

## 2013-08-27 MED ORDER — DEXAMETHASONE SODIUM PHOSPHATE 10 MG/ML IJ SOLN
10.0000 mg | Freq: Once | INTRAMUSCULAR | Status: AC
  Administered 2013-08-28: 10 mg via INTRAVENOUS
  Filled 2013-08-27: qty 1

## 2013-08-27 MED ORDER — CEFAZOLIN SODIUM-DEXTROSE 2-3 GM-% IV SOLR
INTRAVENOUS | Status: AC
  Filled 2013-08-27: qty 50

## 2013-08-27 MED ORDER — PANTOPRAZOLE SODIUM 40 MG PO TBEC
40.0000 mg | DELAYED_RELEASE_TABLET | Freq: Every day | ORAL | Status: DC
Start: 2013-08-27 — End: 2013-08-29
  Administered 2013-08-27 – 2013-08-29 (×3): 40 mg via ORAL
  Filled 2013-08-27 (×4): qty 1

## 2013-08-27 MED ORDER — POLYETHYLENE GLYCOL 3350 17 G PO PACK
17.0000 g | PACK | Freq: Two times a day (BID) | ORAL | Status: DC
  Administered 2013-08-27 – 2013-08-29 (×4): 17 g via ORAL

## 2013-08-27 MED ORDER — PHENOL 1.4 % MT LIQD: 1.0000 | OROMUCOSAL | Status: DC | PRN

## 2013-08-27 MED ORDER — DEXAMETHASONE SODIUM PHOSPHATE 10 MG/ML IJ SOLN
10.0000 mg | Freq: Once | INTRAMUSCULAR | Status: AC
  Administered 2013-08-27: 10 mg via INTRAVENOUS

## 2013-08-27 MED ORDER — ROPIVACAINE HCL 5 MG/ML IJ SOLN
INTRAMUSCULAR | Status: DC | PRN
  Administered 2013-08-27: 2.6 mL

## 2013-08-27 MED ORDER — 0.9 % SODIUM CHLORIDE (POUR BTL) OPTIME
TOPICAL | Status: DC | PRN
  Administered 2013-08-27: 1000 mL

## 2013-08-27 MED ORDER — METOCLOPRAMIDE HCL 10 MG PO TABS: 5.0000 mg | ORAL_TABLET | Freq: Three times a day (TID) | ORAL | Status: DC | PRN

## 2013-08-27 MED ORDER — HYDROCODONE-ACETAMINOPHEN 7.5-325 MG PO TABS
1.0000 | ORAL_TABLET | ORAL | Status: DC | PRN
  Administered 2013-08-27 (×2): 1 via ORAL
  Administered 2013-08-27 – 2013-08-29 (×8): 2 via ORAL
  Filled 2013-08-27: qty 2
  Filled 2013-08-27 (×2): qty 1
  Filled 2013-08-27 (×7): qty 2

## 2013-08-27 MED ORDER — METHOCARBAMOL 100 MG/ML IJ SOLN
500.0000 mg | Freq: Four times a day (QID) | INTRAVENOUS | Status: DC | PRN
  Administered 2013-08-27: 500 mg via INTRAVENOUS
  Filled 2013-08-27: qty 5

## 2013-08-27 MED ORDER — HYDROMORPHONE HCL PF 1 MG/ML IJ SOLN: 0.2500 mg | INTRAMUSCULAR | Status: DC | PRN

## 2013-08-27 MED ORDER — FLEET ENEMA 7-19 GM/118ML RE ENEM: 1.0000 | ENEMA | Freq: Once | RECTAL | Status: AC | PRN

## 2013-08-27 MED ORDER — LACTATED RINGERS IV SOLN
INTRAVENOUS | Status: DC
  Administered 2013-08-27: 15:00:00 via INTRAVENOUS
  Administered 2013-08-27: 1000 mL via INTRAVENOUS

## 2013-08-27 MED ORDER — HYDROMORPHONE HCL PF 1 MG/ML IJ SOLN
0.5000 mg | INTRAMUSCULAR | Status: DC | PRN
  Administered 2013-08-27: 0.5 mg via INTRAVENOUS
  Filled 2013-08-27: qty 1

## 2013-08-27 MED ORDER — CHLORHEXIDINE GLUCONATE 4 % EX LIQD: 60.0000 mL | Freq: Once | CUTANEOUS | Status: DC

## 2013-08-27 MED ORDER — DOCUSATE SODIUM 100 MG PO CAPS
100.0000 mg | ORAL_CAPSULE | Freq: Two times a day (BID) | ORAL | Status: DC
  Administered 2013-08-27 – 2013-08-29 (×4): 100 mg via ORAL

## 2013-08-27 MED ORDER — METOCLOPRAMIDE HCL 5 MG/ML IJ SOLN: 5.0000 mg | Freq: Three times a day (TID) | INTRAMUSCULAR | Status: DC | PRN

## 2013-08-27 MED ORDER — ONDANSETRON HCL 4 MG PO TABS: 4.0000 mg | ORAL_TABLET | Freq: Four times a day (QID) | ORAL | Status: DC | PRN

## 2013-08-27 MED ORDER — METHOCARBAMOL 500 MG PO TABS
500.0000 mg | ORAL_TABLET | Freq: Four times a day (QID) | ORAL | Status: DC | PRN
  Administered 2013-08-27 – 2013-08-28 (×3): 500 mg via ORAL
  Filled 2013-08-27 (×3): qty 1

## 2013-08-27 MED ORDER — TRAMADOL HCL 50 MG PO TABS: 50.0000 mg | ORAL_TABLET | Freq: Four times a day (QID) | ORAL | Status: DC | PRN

## 2013-08-27 MED ORDER — SODIUM CHLORIDE 0.9 % IV SOLN
100.0000 mL/h | INTRAVENOUS | Status: DC
  Administered 2013-08-27 – 2013-08-28 (×2): 100 mL/h via INTRAVENOUS
  Filled 2013-08-27 (×10): qty 1000

## 2013-08-27 MED ORDER — ONDANSETRON HCL 4 MG/2ML IJ SOLN
INTRAMUSCULAR | Status: DC | PRN
  Administered 2013-08-27: 4 mg via INTRAVENOUS

## 2013-08-27 MED ORDER — CEFAZOLIN SODIUM-DEXTROSE 2-3 GM-% IV SOLR
2.0000 g | Freq: Four times a day (QID) | INTRAVENOUS | Status: AC
  Administered 2013-08-27 – 2013-08-28 (×2): 2 g via INTRAVENOUS
  Filled 2013-08-27 (×2): qty 50

## 2013-08-27 MED ORDER — ZOLPIDEM TARTRATE 5 MG PO TABS: 5.0000 mg | ORAL_TABLET | Freq: Every evening | ORAL | Status: DC | PRN

## 2013-08-27 MED ORDER — ASPIRIN EC 325 MG PO TBEC
325.0000 mg | DELAYED_RELEASE_TABLET | Freq: Two times a day (BID) | ORAL | Status: DC
  Administered 2013-08-28 – 2013-08-29 (×3): 325 mg via ORAL
  Filled 2013-08-27 (×5): qty 1

## 2013-08-27 MED ORDER — TRANEXAMIC ACID 100 MG/ML IV SOLN
1000.0000 mg | Freq: Once | INTRAVENOUS | Status: AC
  Administered 2013-08-27: 1000 mg via INTRAVENOUS
  Filled 2013-08-27: qty 10

## 2013-08-27 SURGICAL SUPPLY — 39 items
ADH SKN CLS APL DERMABOND .7 (GAUZE/BANDAGES/DRESSINGS) ×1
BAG SPEC THK2 15X12 ZIP CLS (MISCELLANEOUS)
BAG ZIPLOCK 12X15 (MISCELLANEOUS) IMPLANT
BLADE SAW SGTL 18X1.27X75 (BLADE) ×2 IMPLANT
CAPT HIP PF MOP ×1 IMPLANT
DERMABOND ADVANCED (GAUZE/BANDAGES/DRESSINGS) ×1
DERMABOND ADVANCED .7 DNX12 (GAUZE/BANDAGES/DRESSINGS) ×1 IMPLANT
DRAPE C-ARM 42X120 X-RAY (DRAPES) ×2 IMPLANT
DRAPE STERI IOBAN 125X83 (DRAPES) ×2 IMPLANT
DRAPE U-SHAPE 47X51 STRL (DRAPES) ×6 IMPLANT
DRSG AQUACEL AG ADV 3.5X10 (GAUZE/BANDAGES/DRESSINGS) ×2 IMPLANT
DRSG TEGADERM 4X4.75 (GAUZE/BANDAGES/DRESSINGS) IMPLANT
DURAPREP 26ML APPLICATOR (WOUND CARE) ×2 IMPLANT
ELECT BLADE TIP CTD 4 INCH (ELECTRODE) ×2 IMPLANT
ELECT REM PT RETURN 9FT ADLT (ELECTROSURGICAL) ×2
ELECTRODE REM PT RTRN 9FT ADLT (ELECTROSURGICAL) ×1 IMPLANT
EVACUATOR 1/8 PVC DRAIN (DRAIN) IMPLANT
FACESHIELD LNG OPTICON STERILE (SAFETY) ×8 IMPLANT
GAUZE SPONGE 2X2 8PLY STRL LF (GAUZE/BANDAGES/DRESSINGS) IMPLANT
GLOVE BIOGEL PI IND STRL 7.5 (GLOVE) ×1 IMPLANT
GLOVE BIOGEL PI IND STRL 8 (GLOVE) ×1 IMPLANT
GLOVE BIOGEL PI INDICATOR 7.5 (GLOVE) ×1
GLOVE BIOGEL PI INDICATOR 8 (GLOVE) ×1
GLOVE ECLIPSE 8.0 STRL XLNG CF (GLOVE) ×2 IMPLANT
GLOVE ORTHO TXT STRL SZ7.5 (GLOVE) ×4 IMPLANT
GOWN BRE IMP PREV XXLGXLNG (GOWN DISPOSABLE) ×2 IMPLANT
GOWN PREVENTION PLUS LG XLONG (DISPOSABLE) ×2 IMPLANT
KIT BASIN OR (CUSTOM PROCEDURE TRAY) ×2 IMPLANT
PACK TOTAL JOINT (CUSTOM PROCEDURE TRAY) ×2 IMPLANT
PADDING CAST COTTON 6X4 STRL (CAST SUPPLIES) ×2 IMPLANT
SPONGE GAUZE 2X2 STER 10/PKG (GAUZE/BANDAGES/DRESSINGS)
SUCTION FRAZIER 12FR DISP (SUCTIONS) IMPLANT
SUT MNCRL AB 4-0 PS2 18 (SUTURE) ×2 IMPLANT
SUT VIC AB 1 CT1 36 (SUTURE) ×6 IMPLANT
SUT VIC AB 2-0 CT1 27 (SUTURE) ×4
SUT VIC AB 2-0 CT1 TAPERPNT 27 (SUTURE) ×2 IMPLANT
SUT VLOC 180 0 24IN GS25 (SUTURE) ×2 IMPLANT
TOWEL OR 17X26 10 PK STRL BLUE (TOWEL DISPOSABLE) ×2 IMPLANT
TRAY FOLEY CATH 14FRSI W/METER (CATHETERS) ×2 IMPLANT

## 2013-08-27 NOTE — Anesthesia Postprocedure Evaluation (Signed)
  Anesthesia Post-op Note  Patient: Raymond Cuevas  Procedure(s) Performed: Procedure(s) (LRB): RIGHT TOTAL HIP ARTHROPLASTY ANTERIOR APPROACH (Right)  Patient Location: PACU  Anesthesia Type: Spinal  Level of Consciousness: awake and alert   Airway and Oxygen Therapy: Patient Spontanous Breathing  Post-op Pain: mild  Post-op Assessment: Post-op Vital signs reviewed, Patient's Cardiovascular Status Stable, Respiratory Function Stable, Patent Airway and No signs of Nausea or vomiting  Last Vitals:  Filed Vitals:   08/27/13 1700  BP: 128/69  Pulse: 53  Temp: 36.3 C  Resp: 11    Post-op Vital Signs: stable   Complications: No apparent anesthesia complications

## 2013-08-27 NOTE — Anesthesia Procedure Notes (Signed)
Spinal  Patient location during procedure: OR Staffing Performed by: anesthesiologist  Preanesthetic Checklist Completed: patient identified, site marked, surgical consent, pre-op evaluation, timeout performed, IV checked, risks and benefits discussed and monitors and equipment checked Spinal Block Patient position: sitting Prep: Betadine Patient monitoring: heart rate, continuous pulse ox and blood pressure Location: L2-3 Injection technique: single-shot Needle Needle type: Spinocan  Needle gauge: 22 G Needle length: 9 cm Additional Notes Expiration date of kit checked and confirmed. Patient tolerated procedure well, without complications.

## 2013-08-27 NOTE — Interval H&P Note (Signed)
History and Physical Interval Note:  08/27/2013 1:08 PM  Raymond Cuevas  has presented today for surgery, with the diagnosis of Osteoarthritis of the Right Hip  The various methods of treatment have been discussed with the patient and family. After consideration of risks, benefits and other options for treatment, the patient has consented to  Procedure(s): RIGHT TOTAL HIP ARTHROPLASTY ANTERIOR APPROACH (Right) as a surgical intervention .  The patient's history has been reviewed, patient examined, no change in status, stable for surgery.  I have reviewed the patient's chart and labs.  Questions were answered to the patient's satisfaction.     [name]

## 2013-08-27 NOTE — Transfer of Care (Signed)
Immediate Anesthesia Transfer of Care Note  Patient: Raymond Cuevas  Procedure(s) Performed: Procedure(s): RIGHT TOTAL HIP ARTHROPLASTY ANTERIOR APPROACH (Right)  Patient Location: PACU  Anesthesia Type:Spinal  Level of Consciousness: awake and patient cooperative  Airway & Oxygen Therapy: Patient Spontanous Breathing and Patient connected to face mask oxygen  Post-op Assessment: Report given to PACU RN and Post -op Vital signs reviewed and stable  Post vital signs: Reviewed and stable  Complications: No apparent anesthesia complications

## 2013-08-27 NOTE — Anesthesia Preprocedure Evaluation (Signed)
Anesthesia Evaluation  Patient identified by MRN, date of birth, ID band Patient awake    Reviewed: Allergy & Precautions, H&P , NPO status , Patient's Chart, lab work & pertinent test results  Airway Mallampati: II TM Distance: >3 FB Neck ROM: Full    Dental no notable dental hx.    Pulmonary neg pulmonary ROS,  breath sounds clear to auscultation  Pulmonary exam normal       Cardiovascular negative cardio ROS  Rhythm:Regular Rate:Normal     Neuro/Psych negative neurological ROS  negative psych ROS   GI/Hepatic Neg liver ROS, GERD-  Medicated,  Endo/Other  negative endocrine ROS  Renal/GU negative Renal ROS  negative genitourinary   Musculoskeletal negative musculoskeletal ROS (+)   Abdominal   Peds negative pediatric ROS (+)  Hematology negative hematology ROS (+)   Anesthesia Other Findings   Reproductive/Obstetrics negative OB ROS                           Anesthesia Physical Anesthesia Plan  ASA: II  Anesthesia Plan: Spinal   Post-op Pain Management:    Induction: Intravenous  Airway Management Planned: Simple Face Mask  Additional Equipment:   Intra-op Plan:   Post-operative Plan:   Informed Consent: I have reviewed the patients History and Physical, chart, labs and discussed the procedure including the risks, benefits and alternatives for the proposed anesthesia with the patient or authorized representative who has indicated his/her understanding and acceptance.   Dental advisory given  Plan Discussed with: CRNA and Surgeon  Anesthesia Plan Comments:         Anesthesia Quick Evaluation  

## 2013-08-27 NOTE — Op Note (Signed)
NAME:  Raymond Cuevas                ACCOUNT NO.: [id number]      MEDICAL RECORD NO.: [id number]      FACILITY:  [facility]      PHYSICIAN:  Durene Romans D  DATE OF BIRTH:  July 25, 1934     DATE OF PROCEDURE:  08/27/2013                                 OPERATIVE REPORT         PREOPERATIVE DIAGNOSIS: Right  hip osteoarthritis.      POSTOPERATIVE DIAGNOSIS:  Right hip osteoarthritis.      PROCEDURE:  Right total hip replacement through an anterior approach   utilizing DePuy THR system, component size 56mm pinnacle cup, a size 36+4 neutral   Altrex liner, a size 7 Hi Tri Lock stem with a 36+8.5 Articuleze metal ball.      SURGEON:  Madlyn Frankel. Charlann Boxer, M.D.      ASSISTANT:  Lanney Gins, PA-C     ANESTHESIA:  General.      SPECIMENS:  None.      COMPLICATIONS:  None.      BLOOD LOSS:  300 cc     DRAINS:  None.      INDICATION OF THE PROCEDURE:  NUMAN ZYLSTRA is a 79 y.o. male who had   presented to office for evaluation of right hip pain.  Radiographs revealed   progressive degenerative changes with bone-on-bone   articulation to the  hip joint.  The patient had painful limited range of   motion significantly affecting their overall quality of life.  The patient was failing to    respond to conservative measures, and at this point was ready   to proceed with more definitive measures.  The patient has noted progressive   degenerative changes in his hip, progressive problems and dysfunction   with regarding the hip prior to surgery.  Consent was obtained for   benefit of pain relief.  Specific risk of infection, DVT, component   failure, dislocation, need for revision surgery, as well discussion of   the anterior versus posterior approach were reviewed.  Consent was   obtained for benefit of anterior pain relief through an anterior   approach.      PROCEDURE IN DETAIL:  The patient was brought to operative theater.   Once adequate anesthesia,  preoperative antibiotics, 3gm Ancef administered.   The patient was positioned supine on the OSI Hanna table.  Once adequate   padding of boney process was carried out, we had predraped out the hip, and  used fluoroscopy to confirm orientation of the pelvis and position.      The right hip was then prepped and draped from proximal iliac crest to   mid thigh with shower curtain technique.      Time-out was performed identifying the patient, planned procedure, and   extremity.     An incision was then made 2 cm distal and lateral to the   anterior superior iliac spine extending over the orientation of the   tensor fascia lata muscle and sharp dissection was carried down to the   fascia of the muscle and protractor placed in the soft tissues.      The fascia was then incised.  The muscle belly was identified and swept   laterally  and retractor placed along the superior neck.  Following   cauterization of the circumflex vessels and removing some pericapsular   fat, a second cobra retractor was placed on the inferior neck.  A third   retractor was placed on the anterior acetabulum after elevating the   anterior rectus.  A L-capsulotomy was along the line of the   superior neck to the trochanteric fossa, then extended proximally and   distally.  Tag sutures were placed and the retractors were then placed   intracapsular.  We then identified the trochanteric fossa and   orientation of my neck cut, confirmed this radiographically   and then made a neck osteotomy with the femur on traction.  The femoral   head was removed without difficulty or complication.  Traction was let   off and retractors were placed posterior and anterior around the   acetabulum.      The labrum and foveal tissue were debrided.  I began reaming with a 51mm   reamer and reamed up to 55mm reamer with good bony bed preparation and a 56   cup was chosen.  The final 56mm Pinnacle cup was then impacted under fluoroscopy  to  confirm the depth of penetration and orientation with respect to   abduction.  A screw was placed followed by the hole eliminator.  The final   36+4 neutral Altrex liner was impacted with good visualized rim fit.  The cup was positioned anatomically within the acetabular portion of the pelvis.      At this point, the femur was rolled at 80 degrees.  Further capsule was   released off the inferior aspect of the femoral neck.  I then   released the superior capsule proximally.  The hook was placed laterally   along the femur and elevated manually and held in position with the bed   hook.  The leg was then extended and adducted with the leg rolled to 100   degrees of external rotation.  Once the proximal femur was fully   exposed, I used a box osteotome to set orientation.  I then began   broaching with the starting chili pepper broach and passed this by hand and then broached up to 7.  With the 7 broach in place I chose a high offset neck and did trial reductions first with the 36+1.5 then up to the 8.5 to best match offset.  The offset was appropriate, leg lengths   appeared to be equal, confirmed radiographically.   Given these findings, I went ahead and dislocated the hip, repositioned all   retractors and positioned the right hip in the extended and abducted position.  The final 7 Hi Tri Lock stem was   chosen and it was impacted down to the level of neck cut.  Based on this   and the trial reduction, a 36+8.5 Articuleze metal ball was chosen and   impacted onto a clean and dry trunnion, and the hip was reduced.  The   hip had been irrigated throughout the case again at this point.  I did   reapproximate the superior capsular leaflet to the anterior leaflet   using #1 Vicryl.  The fascia of the   tensor fascia lata muscle was then reapproximated using #1 Vicryl and #0 V-lock sutures.  The   remaining wound was closed with 2-0 Vicryl and running 4-0 Monocryl.   The hip was cleaned, dried,  and dressed sterilely using Dermabond and   Aquacel  dressing.  She was then brought   to recovery room in stable condition tolerating the procedure well.    Lanney Gins, PA-C was present for the entirety of the case involved from   preoperative positioning, perioperative retractor management, general   facilitation of the case, as well as primary wound closure as assistant.            Madlyn Frankel Charlann Boxer, M.D.            MDO/MEDQ  D:  08/09/2011  T:  08/09/2011  Job:  [id number]      Electronically Signed by Durene Romans M.D. on 08/15/2011 09:15:38 AM

## 2013-08-28 ENCOUNTER — Encounter (HOSPITAL_COMMUNITY): Payer: Self-pay | Admitting: Orthopedic Surgery

## 2013-08-28 DIAGNOSIS — E6609 Other obesity due to excess calories: Secondary | ICD-10-CM | POA: Diagnosis present

## 2013-08-28 LAB — CBC
HCT: 40.3 % (ref 39.0–52.0)
Hemoglobin: 14 g/dL (ref 13.0–17.0)
MCH: 31.2 pg (ref 26.0–34.0)
MCHC: 34.7 g/dL (ref 30.0–36.0)
MCV: 89.8 fL (ref 78.0–100.0)
RDW: 12.4 % (ref 11.5–15.5)

## 2013-08-28 LAB — BASIC METABOLIC PANEL
BUN: 12 mg/dL (ref 6–23)
Calcium: 9 mg/dL (ref 8.4–10.5)
Creatinine, Ser: 1.07 mg/dL (ref 0.50–1.35)
GFR calc non Af Amer: 65 mL/min — ABNORMAL LOW (ref >90)
Glucose, Bld: 147 mg/dL — ABNORMAL HIGH (ref 70–99)
Potassium: 4.1 mEq/L (ref 3.5–5.1)
Sodium: 135 mEq/L (ref 135–145)

## 2013-08-28 NOTE — Evaluation (Signed)
Physical Therapy Evaluation Patient Details Name: Raymond Cuevas MRN: [id number] DOB: Jul 11, 1934 Today's Date: 08/28/2013 Time: 4098-1191 PT Time Calculation (min): 21 min  PT Assessment / Plan / Recommendation History of Present Illness  79 yo male s/p R THA-direct anterior approach. Hx of R knee pain.   Clinical Impression  On eval, pt required Min assist for mobility-able to ambulate ~60 feet with walker. Pt reports R anterior thigh and knee pain. Explained to pt that it is common for him to have thigh pain after this procedure. Recommend HHPT. Pt states plan is for home with friends/neighbor assisting PRN.     PT Assessment  Patient needs continued PT services    Follow Up Recommendations  Home health PT    Does the patient have the potential to tolerate intense rehabilitation      Barriers to Discharge        Equipment Recommendations  None recommended by PT    Recommendations for Other Services OT consult   Frequency 7X/week    Precautions / Restrictions Precautions Precautions: Fall Restrictions Weight Bearing Restrictions: No RLE Weight Bearing: Weight bearing as tolerated   Pertinent Vitals/Pain 7/10 R anterior thigh/knee. Ice applied to hip and thigh.       Mobility  Bed Mobility Bed Mobility: Supine to Sit Supine to Sit: 4: Min assist Details for Bed Mobility Assistance: assist for R LE Transfers Transfers: Sit to Stand;Stand to Sit Sit to Stand: 4: Min assist;From bed Stand to Sit: 4: Min guard;To chair/3-in-1 Details for Transfer Assistance: Assist to rise, stabilize.  Ambulation/Gait Ambulation/Gait Assistance: 4: Min assist Ambulation Distance (Feet): 60 Feet Assistive device: Rolling walker Ambulation/Gait Assistance Details: Assist to stabilize intermittently. slow gait speed. VCs safety, sequence.  Gait Pattern: Step-to pattern;Step-through pattern;Decreased stride length;Decreased step length - right;Antalgic;Decreased weight shift to  right    Exercises     PT Diagnosis: Difficulty walking;Abnormality of gait;Acute pain  PT Problem List: Decreased strength;Decreased range of motion;Decreased activity tolerance;Decreased balance;Decreased mobility;Pain;Decreased knowledge of use of DME PT Treatment Interventions: DME instruction;Gait training;Stair training;Functional mobility training;Therapeutic activities;Therapeutic exercise;Patient/family education     PT Goals(Current goals can be found in the care plan section) Acute Rehab PT Goals Patient Stated Goal: less pain. regain independence PT Goal Formulation: With patient Time For Goal Achievement: 09/04/13 Potential to Achieve Goals: Good  Visit Information  Last PT Received On: 08/28/13 Assistance Needed: +1 History of Present Illness: 79 yo male s/p R THA-direct anterior approach. Hx of R knee pain.        Prior Functioning  Home Living Family/patient expects to be discharged to:: Private residence Living Arrangements: Alone Available Help at Discharge: Neighbor;Friend(s);Available PRN/intermittently Type of Home: House Home Access: Stairs to enter [employer] of Steps: 3 +1 Entrance Stairs-Rails: Right;Left Home Layout: One level Home Equipment: Walker - 2 wheels;Cane - single point Prior Function Level of Independence: Independent with assistive device(s) Comments: uses cane, walker Communication Communication: No difficulties    Cognition  Cognition Arousal/Alertness: Awake/alert Behavior During Therapy: WFL for tasks assessed/performed Overall Cognitive Status: Within Functional Limits for tasks assessed    Extremity/Trunk Assessment Upper Extremity Assessment Upper Extremity Assessment: Overall WFL for tasks assessed Lower Extremity Assessment Lower Extremity Assessment: RLE deficits/detail RLE Deficits / Details: hip flex 2/5, hip abd/add 2/5, knee ext at least 3/5 (did not MMT), moves ankle well Cervical / Trunk  Assessment Cervical / Trunk Assessment: Normal   Balance    End of Session PT - End of Session Equipment  Utilized During Treatment: Gait belt Activity Tolerance: Patient limited by pain Patient left: in chair;with call bell/phone within reach  GP     Rebeca Alert, MPT Pager: [phone]

## 2013-08-28 NOTE — Progress Notes (Signed)
Utilization review completed.  

## 2013-08-28 NOTE — Progress Notes (Signed)
   Subjective: 1 Day Post-Op Procedure(s) (LRB): RIGHT TOTAL HIP ARTHROPLASTY ANTERIOR APPROACH (Right)   Patient reports pain as mild, pain well controlled. No events throughout the night.   Objective:   VITALS:   Filed Vitals:   08/28/13 0642  BP: 114/65  Pulse: 67  Temp: 98.4 F (36.9 C)  Resp: 16    Neurovascular intact Dorsiflexion/Plantar flexion intact Incision: dressing C/D/I No cellulitis present Compartment soft  LABS  Recent Labs  08/28/13 0425  HGB 14.0  HCT 40.3  WBC 12.3*  PLT 347     Recent Labs  08/28/13 0425  NA 135  K 4.1  BUN 12  CREATININE 1.07  GLUCOSE 147*     Assessment/Plan: 1 Day Post-Op Procedure(s) (LRB): RIGHT TOTAL HIP ARTHROPLASTY ANTERIOR APPROACH (Right) Foley cath d/c'ed Advance diet Up with therapy D/C IV fluids Discharge home with home health eventually, when ready  Obese (BMI 30-39.9) Estimated body mass index is 33.5 kg/(m^2) as calculated from the following:   Height as of this encounter: 6\' 3"  (1.905 m).   Weight as of this encounter: 121.564 kg (268 lb). Patient also counseled that weight may inhibit the healing process Patient counseled that losing weight will help with future health issues      Anastasio Auerbach. Pedram Goodchild   PAC  08/28/2013, 9:38 AM

## 2013-08-28 NOTE — Evaluation (Signed)
Occupational Therapy Evaluation Patient Details Name: Raymond Cuevas MRN: [id number] DOB: [birth year] Today's Date: 08/28/2013 Time: 7829-5621 OT Time Calculation (min): 22 min  OT Assessment / Plan / Recommendation History of present illness 79 yo male s/p R THA-direct anterior approach. Hx of R knee pain.    Clinical Impression   Pt was admitted for the above surgery.  He will benefit from skilled OT to increase safety and independence with adls.  Goals in acute are for supervision level.      OT Assessment  Patient needs continued OT Services    Follow Up Recommendations  Home health OT    Barriers to Discharge      Equipment Recommendations  None recommended by OT    Recommendations for Other Services    Frequency  Min 2X/week    Precautions / Restrictions Precautions Precautions: Fall Restrictions Weight Bearing Restrictions: No RLE Weight Bearing: Weight bearing as tolerated   Pertinent Vitals/Pain Pain moderate R thigh to knee.  Repositioned and ice reapplied    ADL  Grooming: Set up Where Assessed - Grooming: Unsupported sitting Upper Body Bathing: Set up Where Assessed - Upper Body Bathing: Unsupported sitting Lower Body Bathing: Moderate assistance Where Assessed - Lower Body Bathing: Supported sit to stand Upper Body Dressing: Set up Where Assessed - Upper Body Dressing: Unsupported sitting Lower Body Dressing: Maximal assistance Where Assessed - Lower Body Dressing: Supported sit to stand Toilet Transfer: Hydrographic surveyor Method: Sit to Barista: Raised toilet seat with arms (or 3-in-1 over toilet) Toileting - Clothing Manipulation and Hygiene: Min guard Where Assessed - Toileting Clothing Manipulation and Hygiene: Sit to stand from 3-in-1 or toilet Equipment Used: Reacher Transfers/Ambulation Related to ADLs: ambulated to bathroom with min guard.  Pt has 3 and 4 wheel walkers:  won't fit through tight spaces.  He  states he has caught standard/rolling walker in carpet previously and had falls.  He uses grab bars to negotiate in bathroom, ? safety ADL Comments: pt has reachers but has not used them for adls.  He did not want to practice shower as his has a smaller ledge. Will have intermittent assistance and will do without sock, if needed.  Had a sock aid before, but no longer has this..  Pt is very independent.  Reinforced to listen to body and not push through tasks, think through for safety, and carry cell with him all the time.  He will have daughter in the house when he showers.  He talked about sitting on a low stool to reach feet for adls:  advised not to do this without home health:  he is just used to being very independent.  R thigh to knee is limiting him at this time.      OT Diagnosis: Generalized weakness  OT Problem List: Decreased strength;Decreased activity tolerance;Pain;Decreased knowledge of use of DME or AE OT Treatment Interventions: Self-care/ADL training;DME and/or AE instruction;Patient/family education   OT Goals(Current goals can be found in the care plan section) Acute Rehab OT Goals Patient Stated Goal: less pain. regain independence OT Goal Formulation: With patient Time For Goal Achievement: 09/04/13 Potential to Achieve Goals: Good ADL Goals Pt Will Perform Lower Body Bathing: with supervision;with adaptive equipment;sit to/from stand Pt Will Perform Lower Body Dressing: with supervision;with adaptive equipment;sit to/from stand (no socks) Pt Will Transfer to Toilet: with supervision;ambulating;bedside commode Pt Will Perform Toileting - Clothing Manipulation and hygiene: with supervision;sit to/from stand  Visit Information  Last OT  Received On: 08/28/13 Assistance Needed: +1 History of Present Illness: 79 yo male s/p R THA-direct anterior approach. Hx of R knee pain.        Prior Functioning     Home Living Family/patient expects to be discharged to:: Private  residence Living Arrangements: Alone Available Help at Discharge: Neighbor;Friend(s);Available PRN/intermittently Type of Home: House Home Access: Stairs to enter [employer] of Steps: 3 +1 Entrance Stairs-Rails: Right;Left Home Layout: One level Home Equipment: Bedside commode (BSC also in shower (has 2); reacher) Additional Comments: grab bars in shower and by toilet Prior Function Level of Independence: Independent with assistive device(s) Comments: uses cane, walker Communication Communication: No difficulties         Vision/Perception     Cognition  Cognition Arousal/Alertness: Awake/alert Behavior During Therapy: WFL for tasks assessed/performed Overall Cognitive Status: Within Functional Limits for tasks assessed    Extremity/Trunk Assessment Upper Extremity Assessment Upper Extremity Assessment: Overall WFL for tasks assessed Lower Extremity Assessment (perPT) Lower Extremity Assessment: RLE deficits/detail RLE Deficits / Details: hip flex 2/5, hip abd/add 2/5, knee ext at least 3/5 (did not MMT), moves ankle well Cervical / Trunk Assessment Cervical / Trunk Assessment: Normal     Mobility Bed Mobility Bed Mobility: Supine to Sit Supine to Sit: 4: Min assist Details for Bed Mobility Assistance: assist for R LE Transfers Sit to Stand: 4: Min guard;With armrests;From chair/3-in-1 Stand to Sit: 4: Min guard;To chair/3-in-1;With armrests Details for Transfer Assistance: assist to rise and steady.  Cues for LE placement     Exercise     Balance     End of Session OT - End of Session Activity Tolerance: Patient tolerated treatment well Patient left: in chair;with call bell/phone within reach  GO     Aliahna Statzer 08/28/2013, 1:16 PM Marica Otter, OTR/L [phone] 08/28/2013

## 2013-08-28 NOTE — Progress Notes (Signed)
[facility]  [facility] is providing the following services: patient declined rw and commode - has both at home.   If patient discharges after hours, please call [phone].   Renard Hamper 08/28/2013, 3:21 PM

## 2013-08-28 NOTE — Progress Notes (Signed)
Physical Therapy Treatment Patient Details Name: Raymond Cuevas MRN: [id number] DOB: [birth year] Today's Date: 08/28/2013 Time: 4782-9562 PT Time Calculation (min): 23 min  PT Assessment / Plan / Recommendation  History of Present Illness 79 yo male s/p R THA-direct anterior approach. Hx of R knee pain.    PT Comments   Progressing with mobility.  Follow Up Recommendations  Home health PT     Does the patient have the potential to tolerate intense rehabilitation     Barriers to Discharge        Equipment Recommendations  None recommended by PT    Recommendations for Other Services OT consult  Frequency 7X/week   Progress towards PT Goals Progress towards PT goals: Progressing toward goals  Plan Current plan remains appropriate    Precautions / Restrictions Precautions Precautions: Fall Restrictions Weight Bearing Restrictions: No RLE Weight Bearing: Weight bearing as tolerated   Pertinent Vitals/Pain 5/10 R anterior thigh. Ice applied end of session    Mobility  Bed Mobility Bed Mobility: Sit to Supine Supine to Sit: 4: Min assist Sit to Supine: 4: Min guard Details for Bed Mobility Assistance: assist for R LE Transfers Transfers: Sit to Stand;Stand to Sit Sit to Stand: 4: Min guard;From chair/3-in-1 Stand to Sit: 4: Min guard;To bed Details for Transfer Assistance: close guard for safety Ambulation/Gait Ambulation/Gait Assistance: 4: Min guard Ambulation Distance (Feet): 100 Feet Assistive device: Rolling walker Ambulation/Gait Assistance Details: slow gait speed with short breaks. close guard for safety Gait Pattern: Step-to pattern;Step-through pattern;Decreased stride length;Decreased step length - right;Antalgic;Decreased weight shift to right    Exercises Total Joint Exercises Ankle Circles/Pumps: AROM;Both;15 reps;Supine Quad Sets: AROM;Both;15 reps;Supine Short Arc Quad: AROM;Right;15 reps;Supine Heel Slides: AAROM;Right;15 reps;Supine Hip  ABduction/ADduction: AAROM;Right;15 reps;Supine   PT Diagnosis: Difficulty walking;Abnormality of gait;Acute pain  PT Problem List: Decreased strength;Decreased range of motion;Decreased activity tolerance;Decreased balance;Decreased mobility;Pain;Decreased knowledge of use of DME PT Treatment Interventions: DME instruction;Gait training;Stair training;Functional mobility training;Therapeutic activities;Therapeutic exercise;Patient/family education   PT Goals (current goals can now be found in the care plan section) Acute Rehab PT Goals Patient Stated Goal: less pain. regain independence PT Goal Formulation: With patient Time For Goal Achievement: 09/04/13 Potential to Achieve Goals: Good  Visit Information  Last PT Received On: 08/28/13 Assistance Needed: +1 History of Present Illness: 79 yo male s/p R THA-direct anterior approach. Hx of R knee pain.     Subjective Data  Patient Stated Goal: less pain. regain independence   Cognition  Cognition Arousal/Alertness: Awake/alert Behavior During Therapy: WFL for tasks assessed/performed Overall Cognitive Status: Within Functional Limits for tasks assessed    Balance     End of Session PT - End of Session Equipment Utilized During Treatment: Gait belt Activity Tolerance: Patient tolerated treatment well Patient left: in bed;with call bell/phone within reach;with family/visitor present   GP     Rebeca Alert, MPT Pager: [phone]

## 2013-08-29 LAB — BASIC METABOLIC PANEL
BUN: 11 mg/dL (ref 6–23)
Calcium: 8.9 mg/dL (ref 8.4–10.5)
GFR calc Af Amer: >90 mL/min (ref >90)
GFR calc non Af Amer: 80 mL/min — ABNORMAL LOW (ref >90)
Glucose, Bld: 142 mg/dL — ABNORMAL HIGH (ref 70–99)

## 2013-08-29 LAB — CBC
HCT: 37.4 % — ABNORMAL LOW (ref 39.0–52.0)
Hemoglobin: 13.2 g/dL (ref 13.0–17.0)
MCH: 31.7 pg (ref 26.0–34.0)
MCHC: 35.3 g/dL (ref 30.0–36.0)
RDW: 12.6 % (ref 11.5–15.5)
WBC: 10.6 10*3/uL — ABNORMAL HIGH (ref 4.0–10.5)

## 2013-08-29 MED ORDER — POLYETHYLENE GLYCOL 3350 17 G PO PACK: 17.0000 g | PACK | Freq: Two times a day (BID) | ORAL | Status: DC

## 2013-08-29 MED ORDER — DSS 100 MG PO CAPS: 100.0000 mg | ORAL_CAPSULE | Freq: Two times a day (BID) | ORAL | Status: DC

## 2013-08-29 MED ORDER — ASPIRIN 325 MG PO TBEC: 325.0000 mg | DELAYED_RELEASE_TABLET | Freq: Two times a day (BID) | ORAL | Status: AC

## 2013-08-29 MED ORDER — FERROUS SULFATE 325 (65 FE) MG PO TABS: 325.0000 mg | ORAL_TABLET | Freq: Three times a day (TID) | ORAL | Status: DC

## 2013-08-29 MED ORDER — TRAMADOL HCL 50 MG PO TABS: 50.0000 mg | ORAL_TABLET | Freq: Four times a day (QID) | ORAL | Status: DC | PRN

## 2013-08-29 MED ORDER — TIZANIDINE HCL 4 MG PO CAPS: 4.0000 mg | ORAL_CAPSULE | Freq: Three times a day (TID) | ORAL | Status: DC | PRN

## 2013-08-29 NOTE — Care Management Note (Signed)
    Page 1 of 1   08/29/2013     7:02:18 PM   CARE MANAGEMENT NOTE 08/29/2013  Patient:  Raymond Cuevas, Raymond Cuevas   Account Number:  [id number]  Date Initiated:  08/29/2013  Documentation initiated by:  Colleen Can  Subjective/Objective Assessment:   dx OA rt hip; total hip-anterior approach     Action/Plan:   CM spoke with patient. Plans are for patient to reurn to his home in [place] where family members will be caregivers. He already has DME. Will use [facility] for [facility] services which will start tomorrow 08/30/2013.   Anticipated DC Date:  08/29/2013   Anticipated DC Plan:  HOME W HOME HEALTH SERVICES      DC Planning Services  CM consult      [facility] Choice  HOME HEALTH   Choice offered to / List presented to:  C-1 Patient        HH arranged  HH-2 PT      [facility] agency  [facility]   Status of service:  Completed, signed off Medicare Important Message given?  NA - LOS <3 / Initial given by admissions (If response is "NO", the following Medicare IM given date fields will be blank) Date Medicare IM given:   Date Additional Medicare IM given:    Discharge Disposition:  HOME W HOME HEALTH SERVICES  Per UR Regulation:    If discussed at Long Length of Stay Meetings, dates discussed:    Comments:

## 2013-08-29 NOTE — Progress Notes (Signed)
Physical Therapy Treatment Patient Details Name: Raymond Cuevas MRN: [id number] DOB: [birth year] Today's Date: 08/29/2013 Time: 5621-3086 PT Time Calculation (min): 24 min  PT Assessment / Plan / Recommendation  History of Present Illness 79 yo male s/p R THA-direct anterior approach. Hx of R knee pain.    PT Comments   Progressing very well with mobility. Practiced steps in first session. Pt states MD wants him to have 2 session so will see a 2nd time.   Follow Up Recommendations  Home health PT     Does the patient have the potential to tolerate intense rehabilitation     Barriers to Discharge        Equipment Recommendations  None recommended by PT    Recommendations for Other Services OT consult  Frequency 7X/week   Progress towards PT Goals Progress towards PT goals: Progressing toward goals  Plan Current plan remains appropriate    Precautions / Restrictions Precautions Precautions: Fall Restrictions Weight Bearing Restrictions: No RLE Weight Bearing: Weight bearing as tolerated   Pertinent Vitals/Pain 3/10 R anterior thigh. Ice applied end of session.    Mobility  Bed Mobility Bed Mobility: Supine to Sit;Sit to Supine Supine to Sit: 4: Min guard Sit to Supine: 4: Min guard Transfers Transfers: Sit to Stand;Stand to Sit Sit to Stand: 5: Supervision;From bed Stand to Sit: 5: Supervision;To bed Details for Transfer Assistance: cues for safety Ambulation/Gait Ambulation/Gait Assistance: 5: Supervision Ambulation Distance (Feet): 135 Feet Assistive device: Rolling walker Gait Pattern: Step-through pattern;Decreased stride length Stairs: Yes Stairs Assistance: 4: Mn guard assist Stairs Assistance Details (indicate cue type and reason): Practiced 2 steps with 2 rails on portable stairs and on 1 step with walker. VCS safety, technique.  Stair Management Technique: Two rails;With walker;Forwards Number of Stairs: 3    Exercises Total Joint Exercises Ankle  Circles/Pumps: AROM;Both;15 reps;Supine Quad Sets: AROM;Both;15 reps;Supine Short Arc Quad: AROM;Right;15 reps;Supine Heel Slides: AAROM;Right;15 reps;Supine Hip ABduction/ADduction: AAROM;Right;15 reps;Supine   PT Diagnosis:    PT Problem List:   PT Treatment Interventions:     PT Goals (current goals can now be found in the care plan section)    Visit Information  Last PT Received On: 08/29/13 Assistance Needed: +1 History of Present Illness: 80 yo male s/p R THA-direct anterior approach. Hx of R knee pain.     Subjective Data      Cognition  Cognition Arousal/Alertness: Awake/alert Behavior During Therapy: WFL for tasks assessed/performed Overall Cognitive Status: Within Functional Limits for tasks assessed    Balance     End of Session PT - End of Session Equipment Utilized During Treatment: Gait belt Activity Tolerance: Patient tolerated treatment well Patient left: in bed;with call bell/phone within reach   GP     Rebeca Alert, MPT Pager: [phone]

## 2013-08-29 NOTE — Progress Notes (Signed)
Patient ID: Raymond Cuevas, male   DOB: Sep 03, 1934, 79 y.o.   MRN: [id number] Subjective: 2 Days Post-Op Procedure(s) (LRB): RIGHT TOTAL HIP ARTHROPLASTY ANTERIOR APPROACH (Right)    Patient reports pain as mild.  Progressing well with independent activity, no events or issues  Objective:   VITALS:   Filed Vitals:   08/29/13 0751  BP:   Pulse:   Temp:   Resp: 18    Neurovascular intact Incision: dressing C/D/I  LABS  Recent Labs  08/28/13 0425 08/29/13 0425  HGB 14.0 13.2  HCT 40.3 37.4*  WBC 12.3* 10.6*  PLT 347 317     Recent Labs  08/28/13 0425 08/29/13 0425  NA 135 136  K 4.1 4.3  BUN 12 11  CREATININE 1.07 0.89  GLUCOSE 147* 142*    No results found for this basename: LABPT, INR,  in the last 72 hours   Assessment/Plan: 2 Days Post-Op Procedure(s) (LRB): RIGHT TOTAL HIP ARTHROPLASTY ANTERIOR APPROACH (Right)   Discharge home with home health today after therapy May shower RTC appointment already scheduled for the 24th

## 2013-08-29 NOTE — Progress Notes (Signed)
Physical Therapy Treatment Patient Details Name: Raymond Cuevas MRN: [id number] DOB: [birth year] Today's Date: 08/29/2013 Time: 4098-1191 PT Time Calculation (min): 16 min  PT Assessment / Plan / Recommendation  History of Present Illness 79 yo male s/p R THA-direct anterior approach. Hx of R knee pain.    PT Comments   Progressing well with therapy. All education completed. Ready to d/c home from PT standpoint.   Follow Up Recommendations  Home health PT     Does the patient have the potential to tolerate intense rehabilitation     Barriers to Discharge        Equipment Recommendations  None recommended by PT    Recommendations for Other Services OT consult  Frequency 7X/week   Progress towards PT Goals Progress towards PT goals: Progressing toward goals  Plan Current plan remains appropriate    Precautions / Restrictions Precautions Precautions: None Restrictions Weight Bearing Restrictions: No RLE Weight Bearing: Weight bearing as tolerated   Pertinent Vitals/Pain     Mobility  Bed Mobility Bed Mobility: Supine to Sit Supine to Sit: 4: Min guard Sit to Supine: 4: Min guard Transfers Transfers: Sit to Stand;Stand to Sit Sit to Stand: 5: Supervision;From bed Stand to Sit: 5: Supervision;To chair/3-in-1 Ambulation/Gait Ambulation/Gait Assistance: 5: Supervision Ambulation Distance (Feet): 150 Feet Assistive device: Rolling walker Gait Pattern: Step-through pattern;Decreased stride length   Exercises Total Joint Exercises Ankle Circles/Pumps: AROM;Both;15 reps;Supine Quad Sets: AROM;Both;15 reps;Supine Short Arc Quad: AROM;Right;15 reps;Supine Heel Slides: AAROM;Right;15 reps;Supine Hip ABduction/ADduction: AAROM;Right;15 reps;Supine   PT Diagnosis:    PT Problem List:   PT Treatment Interventions:     PT Goals (current goals can now be found in the care plan section)    Visit Information  Last PT Received On: 08/29/13 Assistance Needed:  +1 History of Present Illness: 79 yo male s/p R THA-direct anterior approach. Hx of R knee pain.     Subjective Data      Cognition  Cognition Arousal/Alertness: Awake/alert Behavior During Therapy: WFL for tasks assessed/performed Overall Cognitive Status: Within Functional Limits for tasks assessed    Balance     End of Session PT - End of Session Equipment Utilized During Treatment: Gait belt Activity Tolerance: Patient tolerated treatment well Patient left: in chair;with call bell/phone within reach   GP     Rebeca Alert, MPT Pager: [phone]

## 2013-08-29 NOTE — Progress Notes (Signed)
Pt to d/c home with home health. AVS reviewed and "My Chart" discussed with pt. Pt capable of verbalizing medications and follow-up appointments. Remains hemodynamically stable. No signs and symptoms of distress. Educated pt to return to ER in the case of SOB, dizziness, or chest pain.

## 2013-08-29 NOTE — Progress Notes (Signed)
Occupational Therapy Treatment Patient Details Name: Raymond Cuevas MRN: [id number] DOB: Apr 01, 1934 Today's Date: 08/29/2013 Time: 4098-1191 OT Time Calculation (min): 12 min  OT Assessment / Plan / Recommendation  History of present illness 79 yo male s/p R THA-direct anterior approach. Hx of R knee pain.    OT comments  Pt very independent: cues for rest breaks and optimal safety  Follow Up Recommendations  Home health OT (iadls)    Barriers to Discharge       Equipment Recommendations  None recommended by OT    Recommendations for Other Services    Frequency     Progress towards OT Goals Progress towards OT goals: Progressing toward goals  Plan      Precautions / Restrictions Precautions Precautions: Fall Restrictions Weight Bearing Restrictions: No RLE Weight Bearing: Weight bearing as tolerated   Pertinent Vitals/Pain No c/o pain    ADL  Lower Body Dressing: Moderate assistance overall; supervision for underwear only Where Assessed - Lower Body Dressing: Supported sit to stand Toilet Transfer: Supervision/safety Statistician Method: Sit to Barista: Bedside commode Transfers/Ambulation Related to ADLs: ambulated from bathroom with supervision:  cues for safety:  reaching to move tray ADL Comments: NT had set pt up in shower. OT assisted with LB dressing.  Pt was able to don pants without AE.  States he will do without socks.  Cues for safety and rest breaks as pt 2/4 dyspnea after shower    OT Diagnosis:    OT Problem List:   OT Treatment Interventions:     OT Goals(current goals can now be found in the care plan section)    Visit Information  Last OT Received On: 08/29/13 Assistance Needed: +1 History of Present Illness: 79 yo male s/p R THA-direct anterior approach. Hx of R knee pain.     Subjective Data      Prior Functioning       Cognition  Cognition Arousal/Alertness: Awake/alert Behavior During Therapy: WFL for  tasks assessed/performed Overall Cognitive Status: Within Functional Limits for tasks assessed    Mobility  Transfers Sit to Stand: 5: Supervision;From chair/3-in-1;With upper extremity assist;From bed Stand to Sit: 5: Supervision;To bed Details for Transfer Assistance: cues for safety    Exercises      Balance     End of Session OT - End of Session Activity Tolerance: Patient tolerated treatment well Patient left: in bed;with call bell/phone within reach (eob)  GO     Jerri Glauser 08/29/2013, 8:49 AM Marica Otter, OTR/L [phone] 08/29/2013

## 2013-08-30 DIAGNOSIS — IMO0001 Reserved for inherently not codable concepts without codable children: Secondary | ICD-10-CM | POA: Diagnosis not present

## 2013-08-30 DIAGNOSIS — M1990 Unspecified osteoarthritis, unspecified site: Secondary | ICD-10-CM | POA: Diagnosis not present

## 2013-08-30 DIAGNOSIS — Z471 Aftercare following joint replacement surgery: Secondary | ICD-10-CM | POA: Diagnosis not present

## 2013-08-30 DIAGNOSIS — Z96659 Presence of unspecified artificial knee joint: Secondary | ICD-10-CM | POA: Diagnosis not present

## 2013-08-30 DIAGNOSIS — D649 Anemia, unspecified: Secondary | ICD-10-CM | POA: Diagnosis not present

## 2013-08-30 NOTE — Discharge Summary (Signed)
Physician Discharge Summary  Patient ID: Raymond Cuevas MRN: [id number] DOB/AGE: 02/18/1934 79 y.o.  Admit date: 08/27/2013 Discharge date: 08/29/2013   Procedures:  Procedure(s) (LRB): RIGHT TOTAL HIP ARTHROPLASTY ANTERIOR APPROACH (Right)  Attending Physician:  Dr. Durene Romans   Admission Diagnoses:   Right hip OA / pain  Discharge Diagnoses:  Principal Problem:   S/P right THA, AA Active Problems:   Obese  Past Medical History  Diagnosis Date  . GERD (gastroesophageal reflux disease)     occ.  . Arthritis     osteoarthritis  . S/P right THA, AA 08/27/2013    HPI:    Raymond Cuevas, 79 y.o. male, has a history of pain and functional disability in the right hip(s) due to arthritis and patient has failed non-surgical conservative treatments for greater than 12 weeks to include NSAID's and/or analgesics, corticosteriod injections, use of assistive devices and activity modification. Onset of symptoms was gradual starting 8+ years ago with gradually worsening course since that time.The patient noted no past surgery on the right hip(s). Patient currently rates pain in the right hip at 10 out of 10 with activity. Patient has night pain, worsening of pain with activity and weight bearing, trendelenberg gait, pain that interfers with activities of daily living and pain with passive range of motion. Patient has evidence of periarticular osteophytes and joint space narrowing by imaging studies. This condition presents safety issues increasing the risk of falls. There is no current active infection. Risks, benefits and expectations were discussed with the patient. Patient understand the risks, benefits and expectations and wishes to proceed with surgery.  PCP: Londell Moh, MD   Discharged Condition: good  Hospital Course:  Patient underwent the above stated procedure on 08/27/2013. Patient tolerated the procedure well and brought to the recovery room in good condition  and subsequently to the floor.  POD #1 BP: 114/65 ; Pulse: 67 ; Temp: 98.4 F (36.9 C) ; Resp: 16  Pt's foley was removed. IV was changed to a saline lock. Patient reports pain as mild, pain well controlled. No events throughout the night.  Neurovascular intact, dorsiflexion/plantar flexion intact, incision: dressing C/D/I, no cellulitis present and compartment soft.   LABS  Basename    HGB  14.0  HCT  40.3   POD #2  BP: 151/80 ; Pulse: 60 ; Temp: 97.5 F (36.4 C) ; Resp: 16  Patient reports pain as mild. Progressing well with independent activity, no events or issues. Ready to be discharged home. Neurovascular intact, dorsiflexion/plantar flexion intact, incision: dressing C/D/I, no cellulitis present and compartment soft.   LABS  Basename    HGB  13.2  HCT  37.4    Discharge Exam: General appearance: alert, cooperative and no distress Extremities: Homans sign is negative, no sign of DVT, no edema, redness or tenderness in the calves or thighs and no ulcers, gangrene or trophic changes  Disposition:    Home or Self Care with follow up in 2 weeks   Follow-up Information   Follow up with [name], MD. Schedule an appointment as soon as possible for a visit in 2 weeks.   Specialty:  Orthopedic Surgery   Contact information:   [address] [place] [address] [phone]       Discharge Orders   Future Orders Complete By Expires   Call MD / Call 911  As directed    Comments:     If you experience chest pain or shortness of  breath, CALL 911 and be transported to the hospital emergency room.  If you develope a fever above 101 F, pus (white drainage) or increased drainage or redness at the wound, or calf pain, call your surgeon's office.   Change dressing  As directed    Comments:     Maintain surgical dressing for 10-14 days, then replace with 4x4 guaze and tape. Keep the area dry and clean.   Constipation Prevention  As directed    Comments:      Drink plenty of fluids.  Prune juice may be helpful.  You may use a stool softener, such as Colace (over the counter) 100 mg twice a day.  Use MiraLax (over the counter) for constipation as needed.   Diet - low sodium heart healthy  As directed    Discharge instructions  As directed    Comments:     Maintain surgical dressing for 10-14 days, then replace with gauze and tape. Keep the area dry and clean until follow up. Follow up in 2 weeks at [facility]. Call with any questions or concerns.   Increase activity slowly as tolerated  As directed    TED hose  As directed    Comments:     Use stockings (TED hose) for 2 weeks on both leg(s).  You may remove them at night for sleeping.   Weight bearing as tolerated  As directed    Questions:     Laterality:     Extremity:          Medication List    STOP taking these medications       diclofenac 75 MG EC tablet  Commonly known as:  VOLTAREN      TAKE these medications       aspirin 325 MG EC tablet  Take 1 tablet (325 mg total) by mouth 2 (two) times daily.     calcium-vitamin D 500-200 MG-UNIT per tablet  Commonly known as:  OSCAL WITH D  Take 1 tablet by mouth.     DSS 100 MG Caps  Take 100 mg by mouth 2 (two) times daily.     ferrous sulfate 325 (65 FE) MG tablet  Take 1 tablet (325 mg total) by mouth 3 (three) times daily after meals.     fish oil-omega-3 fatty acids 1000 MG capsule  Take 1 g by mouth daily.     magnesium hydroxide 400 MG/5ML suspension  Commonly known as:  MILK OF MAGNESIA  Take 30 mLs by mouth 4 (four) times daily as needed for constipation.     omeprazole 20 MG capsule  Commonly known as:  PRILOSEC  Take 20 mg by mouth daily.     polyethylene glycol packet  Commonly known as:  MIRALAX / GLYCOLAX  Take 17 g by mouth 2 (two) times daily.     tiZANidine 4 MG capsule  Commonly known as:  ZANAFLEX  Take 1 capsule (4 mg total) by mouth 3 (three) times daily as needed for muscle  spasms.     traMADol 50 MG tablet  Commonly known as:  ULTRAM  Take 1-2 tablets (50-100 mg total) by mouth every 6 (six) hours as needed for moderate pain.         Signed: Anastasio Auerbach. Damita Eppard   PAC  08/30/2013, 2:34 PM

## 2013-09-02 DIAGNOSIS — D649 Anemia, unspecified: Secondary | ICD-10-CM | POA: Diagnosis not present

## 2013-09-02 DIAGNOSIS — Z471 Aftercare following joint replacement surgery: Secondary | ICD-10-CM | POA: Diagnosis not present

## 2013-09-02 DIAGNOSIS — IMO0001 Reserved for inherently not codable concepts without codable children: Secondary | ICD-10-CM | POA: Diagnosis not present

## 2013-09-02 DIAGNOSIS — Z96659 Presence of unspecified artificial knee joint: Secondary | ICD-10-CM | POA: Diagnosis not present

## 2013-09-02 DIAGNOSIS — M1990 Unspecified osteoarthritis, unspecified site: Secondary | ICD-10-CM | POA: Diagnosis not present

## 2013-09-04 DIAGNOSIS — Z471 Aftercare following joint replacement surgery: Secondary | ICD-10-CM | POA: Diagnosis not present

## 2013-09-04 DIAGNOSIS — D649 Anemia, unspecified: Secondary | ICD-10-CM | POA: Diagnosis not present

## 2013-09-04 DIAGNOSIS — M1990 Unspecified osteoarthritis, unspecified site: Secondary | ICD-10-CM | POA: Diagnosis not present

## 2013-09-04 DIAGNOSIS — Z96659 Presence of unspecified artificial knee joint: Secondary | ICD-10-CM | POA: Diagnosis not present

## 2013-09-04 DIAGNOSIS — IMO0001 Reserved for inherently not codable concepts without codable children: Secondary | ICD-10-CM | POA: Diagnosis not present

## 2013-09-06 DIAGNOSIS — M1990 Unspecified osteoarthritis, unspecified site: Secondary | ICD-10-CM | POA: Diagnosis not present

## 2013-09-06 DIAGNOSIS — Z96659 Presence of unspecified artificial knee joint: Secondary | ICD-10-CM | POA: Diagnosis not present

## 2013-09-06 DIAGNOSIS — Z471 Aftercare following joint replacement surgery: Secondary | ICD-10-CM | POA: Diagnosis not present

## 2013-09-06 DIAGNOSIS — IMO0001 Reserved for inherently not codable concepts without codable children: Secondary | ICD-10-CM | POA: Diagnosis not present

## 2013-09-06 DIAGNOSIS — D649 Anemia, unspecified: Secondary | ICD-10-CM | POA: Diagnosis not present

## 2013-09-10 DIAGNOSIS — Z96659 Presence of unspecified artificial knee joint: Secondary | ICD-10-CM | POA: Diagnosis not present

## 2013-09-10 DIAGNOSIS — IMO0001 Reserved for inherently not codable concepts without codable children: Secondary | ICD-10-CM | POA: Diagnosis not present

## 2013-09-10 DIAGNOSIS — Z471 Aftercare following joint replacement surgery: Secondary | ICD-10-CM | POA: Diagnosis not present

## 2013-09-10 DIAGNOSIS — D649 Anemia, unspecified: Secondary | ICD-10-CM | POA: Diagnosis not present

## 2013-09-10 DIAGNOSIS — M1990 Unspecified osteoarthritis, unspecified site: Secondary | ICD-10-CM | POA: Diagnosis not present

## 2013-09-11 DIAGNOSIS — Z96659 Presence of unspecified artificial knee joint: Secondary | ICD-10-CM | POA: Diagnosis not present

## 2013-09-11 DIAGNOSIS — Z471 Aftercare following joint replacement surgery: Secondary | ICD-10-CM | POA: Diagnosis not present

## 2013-09-11 DIAGNOSIS — IMO0001 Reserved for inherently not codable concepts without codable children: Secondary | ICD-10-CM | POA: Diagnosis not present

## 2013-09-11 DIAGNOSIS — M1990 Unspecified osteoarthritis, unspecified site: Secondary | ICD-10-CM | POA: Diagnosis not present

## 2013-09-11 DIAGNOSIS — D649 Anemia, unspecified: Secondary | ICD-10-CM | POA: Diagnosis not present

## 2013-09-13 DIAGNOSIS — D649 Anemia, unspecified: Secondary | ICD-10-CM | POA: Diagnosis not present

## 2013-09-13 DIAGNOSIS — Z96659 Presence of unspecified artificial knee joint: Secondary | ICD-10-CM | POA: Diagnosis not present

## 2013-09-13 DIAGNOSIS — IMO0001 Reserved for inherently not codable concepts without codable children: Secondary | ICD-10-CM | POA: Diagnosis not present

## 2013-09-13 DIAGNOSIS — Z471 Aftercare following joint replacement surgery: Secondary | ICD-10-CM | POA: Diagnosis not present

## 2013-09-13 DIAGNOSIS — M1990 Unspecified osteoarthritis, unspecified site: Secondary | ICD-10-CM | POA: Diagnosis not present

## 2013-09-17 DIAGNOSIS — IMO0001 Reserved for inherently not codable concepts without codable children: Secondary | ICD-10-CM | POA: Diagnosis not present

## 2013-09-17 DIAGNOSIS — Z471 Aftercare following joint replacement surgery: Secondary | ICD-10-CM | POA: Diagnosis not present

## 2013-09-17 DIAGNOSIS — M1990 Unspecified osteoarthritis, unspecified site: Secondary | ICD-10-CM | POA: Diagnosis not present

## 2013-09-17 DIAGNOSIS — D649 Anemia, unspecified: Secondary | ICD-10-CM | POA: Diagnosis not present

## 2013-09-17 DIAGNOSIS — Z96659 Presence of unspecified artificial knee joint: Secondary | ICD-10-CM | POA: Diagnosis not present

## 2013-09-18 DIAGNOSIS — M1990 Unspecified osteoarthritis, unspecified site: Secondary | ICD-10-CM | POA: Diagnosis not present

## 2013-09-18 DIAGNOSIS — IMO0001 Reserved for inherently not codable concepts without codable children: Secondary | ICD-10-CM | POA: Diagnosis not present

## 2013-09-18 DIAGNOSIS — Z96659 Presence of unspecified artificial knee joint: Secondary | ICD-10-CM | POA: Diagnosis not present

## 2013-09-18 DIAGNOSIS — D649 Anemia, unspecified: Secondary | ICD-10-CM | POA: Diagnosis not present

## 2013-09-18 DIAGNOSIS — Z471 Aftercare following joint replacement surgery: Secondary | ICD-10-CM | POA: Diagnosis not present

## 2013-09-20 DIAGNOSIS — M1990 Unspecified osteoarthritis, unspecified site: Secondary | ICD-10-CM | POA: Diagnosis not present

## 2013-09-20 DIAGNOSIS — Z471 Aftercare following joint replacement surgery: Secondary | ICD-10-CM | POA: Diagnosis not present

## 2013-09-20 DIAGNOSIS — Z96659 Presence of unspecified artificial knee joint: Secondary | ICD-10-CM | POA: Diagnosis not present

## 2013-09-20 DIAGNOSIS — D649 Anemia, unspecified: Secondary | ICD-10-CM | POA: Diagnosis not present

## 2013-09-20 DIAGNOSIS — IMO0001 Reserved for inherently not codable concepts without codable children: Secondary | ICD-10-CM | POA: Diagnosis not present

## 2013-10-16 DIAGNOSIS — M169 Osteoarthritis of hip, unspecified: Secondary | ICD-10-CM | POA: Diagnosis not present

## 2013-10-16 DIAGNOSIS — Z96649 Presence of unspecified artificial hip joint: Secondary | ICD-10-CM | POA: Diagnosis not present

## 2013-11-25 DIAGNOSIS — K219 Gastro-esophageal reflux disease without esophagitis: Secondary | ICD-10-CM | POA: Diagnosis not present

## 2013-11-25 DIAGNOSIS — Z125 Encounter for screening for malignant neoplasm of prostate: Secondary | ICD-10-CM | POA: Diagnosis not present

## 2013-11-25 DIAGNOSIS — E782 Mixed hyperlipidemia: Secondary | ICD-10-CM | POA: Diagnosis not present

## 2013-11-25 DIAGNOSIS — Z0000 Encounter for general adult medical examination without abnormal findings: Secondary | ICD-10-CM | POA: Diagnosis not present

## 2013-11-25 DIAGNOSIS — Z23 Encounter for immunization: Secondary | ICD-10-CM | POA: Diagnosis not present

## 2013-11-27 DIAGNOSIS — M25569 Pain in unspecified knee: Secondary | ICD-10-CM | POA: Diagnosis not present

## 2013-11-29 DIAGNOSIS — K219 Gastro-esophageal reflux disease without esophagitis: Secondary | ICD-10-CM | POA: Diagnosis not present

## 2013-11-29 DIAGNOSIS — IMO0002 Reserved for concepts with insufficient information to code with codable children: Secondary | ICD-10-CM | POA: Diagnosis not present

## 2013-11-29 DIAGNOSIS — Z882 Allergy status to sulfonamides status: Secondary | ICD-10-CM | POA: Diagnosis not present

## 2013-11-29 DIAGNOSIS — J309 Allergic rhinitis, unspecified: Secondary | ICD-10-CM | POA: Diagnosis not present

## 2013-11-29 DIAGNOSIS — M1710 Unilateral primary osteoarthritis, unspecified knee: Secondary | ICD-10-CM | POA: Diagnosis not present

## 2013-12-03 DIAGNOSIS — M25569 Pain in unspecified knee: Secondary | ICD-10-CM | POA: Diagnosis not present

## 2013-12-05 DIAGNOSIS — M25569 Pain in unspecified knee: Secondary | ICD-10-CM | POA: Diagnosis not present

## 2013-12-17 DIAGNOSIS — M25569 Pain in unspecified knee: Secondary | ICD-10-CM | POA: Diagnosis not present

## 2013-12-19 DIAGNOSIS — M25569 Pain in unspecified knee: Secondary | ICD-10-CM | POA: Diagnosis not present

## 2013-12-24 DIAGNOSIS — M25569 Pain in unspecified knee: Secondary | ICD-10-CM | POA: Diagnosis not present

## 2013-12-26 DIAGNOSIS — M25569 Pain in unspecified knee: Secondary | ICD-10-CM | POA: Diagnosis not present

## 2013-12-31 DIAGNOSIS — M25569 Pain in unspecified knee: Secondary | ICD-10-CM | POA: Diagnosis not present

## 2014-01-02 DIAGNOSIS — M25569 Pain in unspecified knee: Secondary | ICD-10-CM | POA: Diagnosis not present

## 2014-01-22 DIAGNOSIS — T8489XA Other specified complication of internal orthopedic prosthetic devices, implants and grafts, initial encounter: Secondary | ICD-10-CM | POA: Diagnosis not present

## 2014-01-22 DIAGNOSIS — M25569 Pain in unspecified knee: Secondary | ICD-10-CM | POA: Diagnosis not present

## 2014-01-22 DIAGNOSIS — Z96649 Presence of unspecified artificial hip joint: Secondary | ICD-10-CM | POA: Diagnosis not present

## 2014-03-08 ENCOUNTER — Encounter (HOSPITAL_COMMUNITY): Payer: Self-pay | Admitting: Emergency Medicine

## 2014-03-08 ENCOUNTER — Emergency Department (HOSPITAL_COMMUNITY)
Admission: EM | Admit: 2014-03-08 | Discharge: 2014-03-08 | Disposition: A | Discharge status: Home / Self Care | Payer: Medicare Other | Attending: Emergency Medicine | Admitting: Emergency Medicine

## 2014-03-08 DIAGNOSIS — K219 Gastro-esophageal reflux disease without esophagitis: Secondary | ICD-10-CM | POA: Diagnosis not present

## 2014-03-08 DIAGNOSIS — M129 Arthropathy, unspecified: Secondary | ICD-10-CM | POA: Insufficient documentation

## 2014-03-08 DIAGNOSIS — IMO0002 Reserved for concepts with insufficient information to code with codable children: Secondary | ICD-10-CM | POA: Insufficient documentation

## 2014-03-08 DIAGNOSIS — T169XXA Foreign body in ear, unspecified ear, initial encounter: Secondary | ICD-10-CM | POA: Diagnosis not present

## 2014-03-08 DIAGNOSIS — Z79899 Other long term (current) drug therapy: Secondary | ICD-10-CM | POA: Insufficient documentation

## 2014-03-08 DIAGNOSIS — Y9289 Other specified places as the place of occurrence of the external cause: Secondary | ICD-10-CM | POA: Insufficient documentation

## 2014-03-08 DIAGNOSIS — Y9389 Activity, other specified: Secondary | ICD-10-CM | POA: Insufficient documentation

## 2014-03-08 NOTE — ED Notes (Signed)
He states he has retained hearing-aide tip in left ear.  He is in no distress.

## 2014-03-08 NOTE — ED Provider Notes (Signed)
CSN: [id number]     Arrival date & time 03/08/14  1823 History  This chart was scribed for non-physician practitioner, Glendell Docker, NP-C working with Blanchard Kelch, MD by Einar Pheasant, ED scribe. This patient was seen in room [place] and the patient's care was started at 6:38 PM.     Chief Complaint  Patient presents with  . Foreign Body in [place]   The history is provided by the patient. No language interpreter was used.   HPI Comments: Raymond Cuevas is a 80 y.o. male who presents to the Emergency Department complaining of a foreign body in her left ear that occurred approximately 30 minutes ago. Pt states that he has a little piece of his plastic from his hearing aid stuck in his left ear. He denies any other pertinent medical problems.   Past Medical History  Diagnosis Date  . GERD (gastroesophageal reflux disease)     occ.  . Arthritis     osteoarthritis  . S/P right THA, AA 08/27/2013   Past Surgical History  Procedure Laterality Date  . Joint replacement Right     Knee  . Cholecystectomy      laparoscopic  . Eye surgery Left     hole repair  . Cataract extraction, bilateral Bilateral   . Tonsillectomy    . Total hip arthroplasty Right 08/27/2013    Procedure: RIGHT TOTAL HIP ARTHROPLASTY ANTERIOR APPROACH;  Surgeon: Mauri Pole, MD;  Location: [facility];  Service: Orthopedics;  Laterality: Right;   No family history on file. History  Substance Use Topics  . Smoking status: Never Smoker   . Smokeless tobacco: Not on file  . Alcohol Use: 0.6 oz/week    1 Cans of beer per week     Comment: rarely    Review of Systems  Constitutional: Negative for fever, chills and diaphoresis.  HENT: Negative for congestion, dental problem and sore throat.   Respiratory: Negative for cough and shortness of breath.   Cardiovascular: Negative for chest pain.  Gastrointestinal: Negative for nausea, vomiting, abdominal pain and diarrhea.  Skin: Negative for rash and  wound.  Neurological: Negative for seizures, syncope, weakness and headaches.  Psychiatric/Behavioral: Negative for sleep disturbance.   Allergies  Codeine; Morphine and related; and Sulfa antibiotics  Home Medications   Prior to Admission medications   Medication Sig Start Date End Date Taking? Authorizing Provider  calcium-vitamin D (OSCAL WITH D) 500-200 MG-UNIT per tablet Take 1 tablet by mouth.    Historical Provider, MD  docusate sodium 100 MG CAPS Take 100 mg by mouth 2 (two) times daily. 08/29/13   Lucille Passy Babish, PA-C  ferrous sulfate 325 (65 FE) MG tablet Take 1 tablet (325 mg total) by mouth 3 (three) times daily after meals. 08/29/13   Lucille Passy Babish, PA-C  fish oil-omega-3 fatty acids 1000 MG capsule Take 1 g by mouth daily.    Historical Provider, MD  magnesium hydroxide (MILK OF MAGNESIA) 400 MG/5ML suspension Take 30 mLs by mouth 4 (four) times daily as needed for constipation.    Historical Provider, MD  omeprazole (PRILOSEC) 20 MG capsule Take 20 mg by mouth daily.    Historical Provider, MD  polyethylene glycol (MIRALAX / GLYCOLAX) packet Take 17 g by mouth 2 (two) times daily. 08/29/13   Lucille Passy Babish, PA-C  tiZANidine (ZANAFLEX) 4 MG capsule Take 1 capsule (4 mg total) by mouth 3 (three) times daily as needed for muscle spasms. 08/29/13   Rodman Key  Scott Babish, PA-C  traMADol (ULTRAM) 50 MG tablet Take 1-2 tablets (50-100 mg total) by mouth every 6 (six) hours as needed for moderate pain. 08/29/13   Lucille Passy Babish, PA-C   BP 129/73  Pulse 79  Temp(Src) 98.3 F (36.8 C) (Oral)  Resp 16  SpO2 99%  Physical Exam  Nursing note and vitals reviewed. Constitutional: He is oriented to person, place, and time. He appears well-developed and well-nourished.  HENT:  Head: Normocephalic and atraumatic.  Plastic fb noted to the left ear canal  Cardiovascular: Normal rate.   Pulmonary/Chest: Effort normal.  Abdominal: He exhibits no distension.   Neurological: He is alert and oriented to person, place, and time.  Skin: Skin is warm and dry.  Psychiatric: He has a normal mood and affect.    ED Course  FOREIGN BODY REMOVAL Date/Time: 03/08/2014 6:52 PM Performed by: Glendell Docker Authorized by: Glendell Docker Consent: Verbal consent obtained. Risks and benefits: risks, benefits and alternatives were discussed Consent given by: patient Body area: ear Patient restrained: no Localization method: visualized Removal mechanism: forceps Complexity: simple 1 objects recovered. Objects recovered: part of hearing aid Post-procedure assessment: foreign body removed Patient tolerance: Patient tolerated the procedure well with no immediate complications.   (including critical care time)  DIAGNOSTIC STUDIES: Oxygen Saturation is 99% on RA, normal by my interpretation.    COORDINATION OF CARE: 6:43 PM- Foreign body was removed from the pt's left ear. Pt advised of plan for treatment and pt agrees.  Labs Review Labs Reviewed - No data to display  Imaging Review No results found.   EKG Interpretation None      MDM   Final diagnoses:  FB ear   Piece removed without any problem. No infection noted  I personally performed the services described in this documentation, which was scribed in my presence. The recorded information has been reviewed and is accurate.     Glendell Docker, NP 03/08/14 [phone]

## 2014-03-08 NOTE — Discharge Instructions (Signed)
Ear Foreign Body °An ear foreign body is an object that is stuck in the ear. It is common for young children to put objects into the ear canal. These may include pebbles, beads, beans, and any other small objects which will fit. In adults, objects such as cotton swabs may become lodged in the ear canal. In all ages, the most common foreign bodies are insects that enter the ear canal.  °SYMPTOMS  °Foreign bodies may cause pain, buzzing or roaring sounds, hearing loss, and ear drainage.  °HOME CARE INSTRUCTIONS  °· Keep all follow-up appointments with your caregiver as told. °· Keep small objects out of reach of young children. Tell them not to put anything in their ears. °SEEK IMMEDIATE MEDICAL CARE IF:  °· You have bleeding from the ear. °· You have increased pain or swelling of the ear. °· You have reduced hearing. °· You have discharge coming from the ear. °· You have a fever. °· You have a headache. °MAKE SURE YOU:  °· Understand these instructions. °· Will watch your condition. °· Will get help right away if you are not doing well or get worse. °Document Released: 09/30/2000 Document Revised: 12/26/2011 Document Reviewed: 05/21/2008 °Exit[employer]® Patient Information ©2014 [employer]. ° °

## 2014-03-08 NOTE — ED Provider Notes (Signed)
Medical screening examination/treatment/procedure(s) were performed by non-physician practitioner and as supervising physician I was immediately available for consultation/collaboration.   EKG Interpretation None        Blanchard Kelch, MD 03/09/14 0000

## 2014-05-22 DIAGNOSIS — Z961 Presence of intraocular lens: Secondary | ICD-10-CM | POA: Diagnosis not present

## 2014-05-22 DIAGNOSIS — H04129 Dry eye syndrome of unspecified lacrimal gland: Secondary | ICD-10-CM | POA: Diagnosis not present

## 2014-05-22 DIAGNOSIS — H43819 Vitreous degeneration, unspecified eye: Secondary | ICD-10-CM | POA: Diagnosis not present

## 2014-05-22 DIAGNOSIS — H40019 Open angle with borderline findings, low risk, unspecified eye: Secondary | ICD-10-CM | POA: Diagnosis not present

## 2014-07-30 DIAGNOSIS — Z23 Encounter for immunization: Secondary | ICD-10-CM | POA: Diagnosis not present

## 2014-07-30 DIAGNOSIS — H40013 Open angle with borderline findings, low risk, bilateral: Secondary | ICD-10-CM | POA: Diagnosis not present

## 2014-08-04 DIAGNOSIS — R1013 Epigastric pain: Secondary | ICD-10-CM | POA: Diagnosis not present

## 2014-08-18 DIAGNOSIS — R05 Cough: Secondary | ICD-10-CM | POA: Diagnosis not present

## 2014-08-18 DIAGNOSIS — R1013 Epigastric pain: Secondary | ICD-10-CM | POA: Diagnosis not present

## 2014-08-18 DIAGNOSIS — R1310 Dysphagia, unspecified: Secondary | ICD-10-CM | POA: Diagnosis not present

## 2014-08-20 DIAGNOSIS — Z471 Aftercare following joint replacement surgery: Secondary | ICD-10-CM | POA: Diagnosis not present

## 2014-08-20 DIAGNOSIS — Z96641 Presence of right artificial hip joint: Secondary | ICD-10-CM | POA: Diagnosis not present

## 2014-08-20 DIAGNOSIS — M25561 Pain in right knee: Secondary | ICD-10-CM | POA: Diagnosis not present

## 2014-08-26 DIAGNOSIS — M25511 Pain in right shoulder: Secondary | ICD-10-CM | POA: Diagnosis not present

## 2014-09-02 DIAGNOSIS — R1013 Epigastric pain: Secondary | ICD-10-CM | POA: Diagnosis not present

## 2014-12-24 DIAGNOSIS — Z0000 Encounter for general adult medical examination without abnormal findings: Secondary | ICD-10-CM | POA: Diagnosis not present

## 2014-12-24 DIAGNOSIS — E782 Mixed hyperlipidemia: Secondary | ICD-10-CM | POA: Diagnosis not present

## 2014-12-24 DIAGNOSIS — K219 Gastro-esophageal reflux disease without esophagitis: Secondary | ICD-10-CM | POA: Diagnosis not present

## 2014-12-24 DIAGNOSIS — Z125 Encounter for screening for malignant neoplasm of prostate: Secondary | ICD-10-CM | POA: Diagnosis not present

## 2014-12-24 DIAGNOSIS — R739 Hyperglycemia, unspecified: Secondary | ICD-10-CM | POA: Diagnosis not present

## 2014-12-29 DIAGNOSIS — Z1212 Encounter for screening for malignant neoplasm of rectum: Secondary | ICD-10-CM | POA: Diagnosis not present

## 2014-12-29 DIAGNOSIS — R400 Somnolence: Secondary | ICD-10-CM | POA: Diagnosis not present

## 2014-12-29 DIAGNOSIS — E781 Pure hyperglyceridemia: Secondary | ICD-10-CM | POA: Diagnosis not present

## 2014-12-29 DIAGNOSIS — Z87828 Personal history of other (healed) physical injury and trauma: Secondary | ICD-10-CM | POA: Diagnosis not present

## 2014-12-29 DIAGNOSIS — R238 Other skin changes: Secondary | ICD-10-CM | POA: Diagnosis not present

## 2014-12-29 DIAGNOSIS — L570 Actinic keratosis: Secondary | ICD-10-CM | POA: Diagnosis not present

## 2015-02-25 DIAGNOSIS — M7541 Impingement syndrome of right shoulder: Secondary | ICD-10-CM | POA: Diagnosis not present

## 2015-06-08 ENCOUNTER — Encounter (HOSPITAL_COMMUNITY): Payer: Self-pay | Admitting: Emergency Medicine

## 2015-06-08 ENCOUNTER — Emergency Department (HOSPITAL_COMMUNITY)
Admission: EM | Admit: 2015-06-08 | Discharge: 2015-06-08 | Disposition: A | Discharge status: Home / Self Care | Payer: Medicare Other | Attending: Emergency Medicine | Admitting: Emergency Medicine

## 2015-06-08 DIAGNOSIS — Z8739 Personal history of other diseases of the musculoskeletal system and connective tissue: Secondary | ICD-10-CM | POA: Diagnosis not present

## 2015-06-08 DIAGNOSIS — T63461A Toxic effect of venom of wasps, accidental (unintentional), initial encounter: Secondary | ICD-10-CM | POA: Diagnosis present

## 2015-06-08 DIAGNOSIS — Z79899 Other long term (current) drug therapy: Secondary | ICD-10-CM | POA: Diagnosis not present

## 2015-06-08 DIAGNOSIS — Y9389 Activity, other specified: Secondary | ICD-10-CM | POA: Insufficient documentation

## 2015-06-08 DIAGNOSIS — K219 Gastro-esophageal reflux disease without esophagitis: Secondary | ICD-10-CM | POA: Diagnosis not present

## 2015-06-08 DIAGNOSIS — Y998 Other external cause status: Secondary | ICD-10-CM | POA: Insufficient documentation

## 2015-06-08 DIAGNOSIS — Y9289 Other specified places as the place of occurrence of the external cause: Secondary | ICD-10-CM | POA: Diagnosis not present

## 2015-06-08 NOTE — Discharge Instructions (Signed)
Recommend over-the-counter Benadryl as needed for hives and/or itching. May wish to start with half a tablet first to avoid excessive drowsiness or may take exclusively at night time. Follow-up with your primary care physician. Return to the ED for new or worsening symptoms.

## 2015-06-08 NOTE — ED Notes (Signed)
Pt A+ox4, reports was working in the yard and stepped into a hole with wasps.  Pt reports c/o pain to hands and L ankle/foot 2/2 stings.  Pt denies cp/palpitations, SOB, wheezing or other complaints.  Skin PWD with reddened areas noted.  Mild swelling to L ankle noted.  Skin otherwise PWD.  Speaking full/clear sentences, rr even/un-lab.  Ambulatory with steady gait.  NAD.

## 2015-06-08 NOTE — ED Provider Notes (Signed)
CSN: [id number]     Arrival date & time 06/08/15  1258 History  This chart was scribed for non-physician practitioner Quincy Carnes, PA-C working with Deno Etienne, DO by Zola Button, ED Scribe. This patient was seen in room [facility] and the patient's care was started at 1:20 PM.     Chief Complaint  Patient presents with  . Insect Bite    to bilat hands, approx 15 to L ankle after stepping in a hole    The history is provided by the patient. No language interpreter was used.   HPI Comments: Raymond Cuevas is a 81 y.o. male who presents to the Emergency Department complaining of multiple wasp stings to his left leg and bilateral arms secondary to stepping in a hole with wasps PTA. He does have some pain to those areas with stings, otherwise no complaints. Patient denies SOB, facial swelling, difficulty swallowing, oral swelling, difficulty speaking, chest pain.  No intervention tried PTA. PMHx includes GERD and arthritis.  VSS.   Past Medical History  Diagnosis Date  . GERD (gastroesophageal reflux disease)     occ.  . Arthritis     osteoarthritis  . S/P right THA, AA 08/27/2013   Past Surgical History  Procedure Laterality Date  . Joint replacement Right     Knee  . Cholecystectomy      laparoscopic  . Eye surgery Left     hole repair  . Cataract extraction, bilateral Bilateral   . Tonsillectomy    . Total hip arthroplasty Right 08/27/2013    Procedure: RIGHT TOTAL HIP ARTHROPLASTY ANTERIOR APPROACH;  Surgeon: Mauri Pole, MD;  Location: [facility];  Service: Orthopedics;  Laterality: Right;   No family history on file. Social History  Substance Use Topics  . Smoking status: Never Smoker   . Smokeless tobacco: None  . Alcohol Use: 0.6 oz/week    1 Cans of beer per week     Comment: rarely    Review of Systems  HENT: Negative for facial swelling.   Respiratory: Negative for shortness of breath.   Skin:       Wasp stings  All other systems reviewed and are  negative.     Allergies  Codeine; Morphine and related; and Sulfa antibiotics  Home Medications   Prior to Admission medications   Medication Sig Start Date End Date Taking? Authorizing Provider  calcium-vitamin D (OSCAL WITH D) 500-200 MG-UNIT per tablet Take 1 tablet by mouth.    Historical Provider, MD  docusate sodium 100 MG CAPS Take 100 mg by mouth 2 (two) times daily. 08/29/13   Danae Orleans, PA-C  ferrous sulfate 325 (65 FE) MG tablet Take 1 tablet (325 mg total) by mouth 3 (three) times daily after meals. 08/29/13   Danae Orleans, PA-C  fish oil-omega-3 fatty acids 1000 MG capsule Take 1 g by mouth daily.    Historical Provider, MD  magnesium hydroxide (MILK OF MAGNESIA) 400 MG/5ML suspension Take 30 mLs by mouth 4 (four) times daily as needed for constipation.    Historical Provider, MD  omeprazole (PRILOSEC) 20 MG capsule Take 20 mg by mouth daily.    Historical Provider, MD  polyethylene glycol (MIRALAX / GLYCOLAX) packet Take 17 g by mouth 2 (two) times daily. 08/29/13   Danae Orleans, PA-C  tiZANidine (ZANAFLEX) 4 MG capsule Take 1 capsule (4 mg total) by mouth 3 (three) times daily as needed for muscle spasms. 08/29/13   Danae Orleans, PA-C  traMADol Veatrice Bourbon)  50 MG tablet Take 1-2 tablets (50-100 mg total) by mouth every 6 (six) hours as needed for moderate pain. 08/29/13   Danae Orleans, PA-C   BP 137/72 mmHg  Pulse 69  Temp(Src) 98.2 F (36.8 C) (Oral)  Resp 18  Ht 6\' 2"  (1.88 m)  Wt 272 lb (123.378 kg)  BMI 34.91 kg/m2  SpO2 94%   Physical Exam  Constitutional: He is oriented to person, place, and time. He appears well-developed and well-nourished. No distress.  HENT:  Head: Normocephalic and atraumatic.  Mouth/Throat: Oropharynx is clear and moist.  No oral swelling or oral lesions, handling secretions well, no difficulty swallowing or speaking; no facial or neck swelling  Eyes: Conjunctivae and EOM are normal. Pupils are equal, round, and reactive to  light.  Neck: Normal range of motion. Neck supple.  Cardiovascular: Normal rate, regular rhythm and normal heart sounds.   Pulmonary/Chest: Effort normal and breath sounds normal. No respiratory distress. He has no wheezes.  Abdominal: Soft. Bowel sounds are normal. There is no tenderness. There is no guarding.  Musculoskeletal: Normal range of motion.  Neurological: He is alert and oriented to person, place, and time.  Skin: Skin is warm and dry. He is not diaphoretic.  Multiple stings noted to hands, right upper arm, and left ankle; localized erythema without signs of superimposed infection or cellulitis  Psychiatric: He has a normal mood and affect.  Nursing note and vitals reviewed.   ED Course  Procedures  DIAGNOSTIC STUDIES: Oxygen Saturation is 94% on room air, adequate by my interpretation.    COORDINATION OF CARE: 1:24 PM-Discussed treatment plan which includes OTC Benadryl with patient/guardian at bedside and patient/guardian agreed to plan.    Labs Review Labs Reviewed - No data to display  Imaging Review No results found.    EKG Interpretation None      MDM   Final diagnoses:  Wasp sting, accidental or unintentional, initial encounter   81 year old male here with wasp stings while mowing grass. He has multiple stings noted to hands, right upper arm, and left ankle. Patient has no bodily/facial/oral swelling.  Localized erythema surroundings stings without signs of superimposed infection or cellulitis.  VSS.  No complaints of chest pain, SOB, difficulty swallowing, throat swelling, or trouble speaking.  Appears to have stings with localized reaction, no systemic symptoms noted at this time.  Patient will be treated with benadryl-- advised to use extreme caution to avoid drowsiness.  Recommended to start with half tablet first to determine side effects, may need to take exclusively at night.  Will follow-up with PCP. Discussed plan with patient, he/she acknowledged  understanding and agreed with plan of care.  Return precautions given for new or worsening symptoms.  I personally performed the services described in this documentation, which was scribed in my presence. The recorded information has been reviewed and is accurate.  Larene Pickett, PA-C 06/08/15 [place], PA-C 06/08/15 [place], DO 06/08/15 1547

## 2015-06-09 DIAGNOSIS — H40013 Open angle with borderline findings, low risk, bilateral: Secondary | ICD-10-CM | POA: Diagnosis not present

## 2015-06-09 DIAGNOSIS — Z961 Presence of intraocular lens: Secondary | ICD-10-CM | POA: Diagnosis not present

## 2015-06-09 DIAGNOSIS — H04123 Dry eye syndrome of bilateral lacrimal glands: Secondary | ICD-10-CM | POA: Diagnosis not present

## 2015-06-09 DIAGNOSIS — H31002 Unspecified chorioretinal scars, left eye: Secondary | ICD-10-CM | POA: Diagnosis not present

## 2015-07-13 DIAGNOSIS — Z23 Encounter for immunization: Secondary | ICD-10-CM | POA: Diagnosis not present

## 2015-07-29 DIAGNOSIS — M25562 Pain in left knee: Secondary | ICD-10-CM | POA: Diagnosis not present

## 2015-07-29 DIAGNOSIS — M1712 Unilateral primary osteoarthritis, left knee: Secondary | ICD-10-CM | POA: Diagnosis not present

## 2015-08-04 IMAGING — CR DG HIP 1V PORT*R*
1 series · 1 of 1 positions shown · non-contrast
Comparison: AP views of the pelvis 08/27/2013

CLINICAL DATA: Postop right hip arthroplasty

EXAM:
PORTABLE RIGHT HIP - 1 VIEW

[AP]
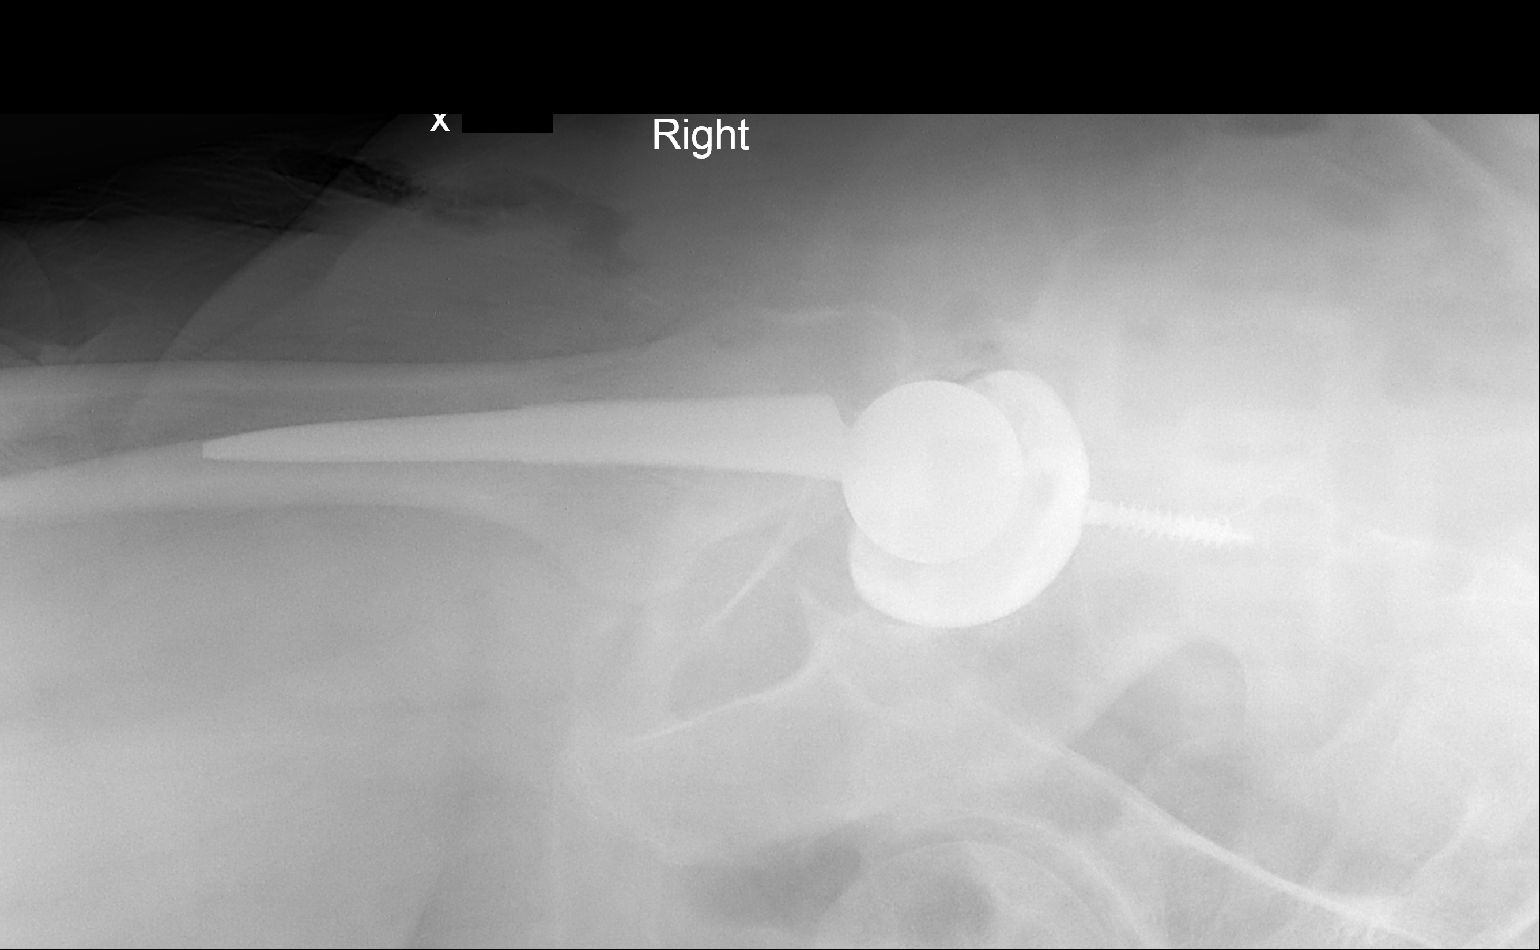

[1 of 1 positions shown; findings below may reference images not displayed]

FINDINGS: Cross-table lateral view shows postsurgical changes of right hip
total arthroplasty. The femoral stem component is satisfactorily
positioned within the acetabular component. No periprosthetic
fracture is identified. Expected locules of subcutaneous gas.
IMPRESSION: Immediate postoperative changes of right hip total arthroplasty. No
complicating feature identified.

## 2015-09-09 DIAGNOSIS — M25562 Pain in left knee: Secondary | ICD-10-CM | POA: Diagnosis not present

## 2015-09-09 DIAGNOSIS — M1712 Unilateral primary osteoarthritis, left knee: Secondary | ICD-10-CM | POA: Diagnosis not present

## 2015-10-21 DIAGNOSIS — M7541 Impingement syndrome of right shoulder: Secondary | ICD-10-CM | POA: Diagnosis not present

## 2015-10-21 DIAGNOSIS — M1712 Unilateral primary osteoarthritis, left knee: Secondary | ICD-10-CM | POA: Diagnosis not present

## 2015-10-21 DIAGNOSIS — M25561 Pain in right knee: Secondary | ICD-10-CM | POA: Diagnosis not present

## 2015-10-21 DIAGNOSIS — M25562 Pain in left knee: Secondary | ICD-10-CM | POA: Diagnosis not present

## 2015-11-05 DIAGNOSIS — M19011 Primary osteoarthritis, right shoulder: Secondary | ICD-10-CM | POA: Diagnosis not present

## 2015-12-04 DIAGNOSIS — M7541 Impingement syndrome of right shoulder: Secondary | ICD-10-CM | POA: Diagnosis not present

## 2015-12-04 DIAGNOSIS — M19011 Primary osteoarthritis, right shoulder: Secondary | ICD-10-CM | POA: Diagnosis not present

## 2015-12-24 DIAGNOSIS — E781 Pure hyperglyceridemia: Secondary | ICD-10-CM | POA: Diagnosis not present

## 2015-12-24 DIAGNOSIS — Z125 Encounter for screening for malignant neoplasm of prostate: Secondary | ICD-10-CM | POA: Diagnosis not present

## 2015-12-24 DIAGNOSIS — Z0000 Encounter for general adult medical examination without abnormal findings: Secondary | ICD-10-CM | POA: Diagnosis not present

## 2015-12-24 DIAGNOSIS — K219 Gastro-esophageal reflux disease without esophagitis: Secondary | ICD-10-CM | POA: Diagnosis not present

## 2015-12-31 DIAGNOSIS — Z7982 Long term (current) use of aspirin: Secondary | ICD-10-CM | POA: Diagnosis not present

## 2015-12-31 DIAGNOSIS — K219 Gastro-esophageal reflux disease without esophagitis: Secondary | ICD-10-CM | POA: Diagnosis not present

## 2015-12-31 DIAGNOSIS — Z1212 Encounter for screening for malignant neoplasm of rectum: Secondary | ICD-10-CM | POA: Diagnosis not present

## 2015-12-31 DIAGNOSIS — H9193 Unspecified hearing loss, bilateral: Secondary | ICD-10-CM | POA: Diagnosis not present

## 2015-12-31 DIAGNOSIS — Z87828 Personal history of other (healed) physical injury and trauma: Secondary | ICD-10-CM | POA: Diagnosis not present

## 2015-12-31 DIAGNOSIS — E781 Pure hyperglyceridemia: Secondary | ICD-10-CM | POA: Diagnosis not present

## 2015-12-31 DIAGNOSIS — K439 Ventral hernia without obstruction or gangrene: Secondary | ICD-10-CM | POA: Diagnosis not present

## 2016-06-09 DIAGNOSIS — H04123 Dry eye syndrome of bilateral lacrimal glands: Secondary | ICD-10-CM | POA: Diagnosis not present

## 2016-06-09 DIAGNOSIS — H35361 Drusen (degenerative) of macula, right eye: Secondary | ICD-10-CM | POA: Diagnosis not present

## 2016-06-09 DIAGNOSIS — H40013 Open angle with borderline findings, low risk, bilateral: Secondary | ICD-10-CM | POA: Diagnosis not present

## 2016-06-09 DIAGNOSIS — Z961 Presence of intraocular lens: Secondary | ICD-10-CM | POA: Diagnosis not present

## 2016-06-17 ENCOUNTER — Other Ambulatory Visit: Payer: Self-pay

## 2016-07-04 DIAGNOSIS — Z23 Encounter for immunization: Secondary | ICD-10-CM | POA: Diagnosis not present

## 2016-09-12 ENCOUNTER — Emergency Department (HOSPITAL_COMMUNITY): Payer: Medicare Other

## 2016-09-12 ENCOUNTER — Emergency Department (HOSPITAL_COMMUNITY)
Admission: EM | Admit: 2016-09-12 | Discharge: 2016-09-12 | Disposition: A | Discharge status: Home / Self Care | Payer: Medicare Other | Attending: Emergency Medicine | Admitting: Emergency Medicine

## 2016-09-12 ENCOUNTER — Encounter (HOSPITAL_COMMUNITY): Payer: Self-pay | Admitting: Emergency Medicine

## 2016-09-12 DIAGNOSIS — M545 Low back pain, unspecified: Secondary | ICD-10-CM

## 2016-09-12 DIAGNOSIS — Z96641 Presence of right artificial hip joint: Secondary | ICD-10-CM | POA: Insufficient documentation

## 2016-09-12 DIAGNOSIS — Z96651 Presence of right artificial knee joint: Secondary | ICD-10-CM | POA: Diagnosis not present

## 2016-09-12 DIAGNOSIS — M549 Dorsalgia, unspecified: Secondary | ICD-10-CM | POA: Diagnosis present

## 2016-09-12 LAB — I-STAT CHEM 8, ED
BUN: 10 mg/dL (ref 6–20)
CHLORIDE: 108 mmol/L (ref 101–111)
Calcium, Ion: 1.15 mmol/L (ref 1.15–1.40)
Creatinine, Ser: 0.9 mg/dL (ref 0.61–1.24)
Glucose, Bld: 95 mg/dL (ref 65–99)
HEMATOCRIT: 44 % (ref 39.0–52.0)
HEMOGLOBIN: 15 g/dL (ref 13.0–17.0)
POTASSIUM: 3.9 mmol/L (ref 3.5–5.1)
Sodium: 142 mmol/L (ref 135–145)
TCO2: 24 mmol/L (ref 0–100)

## 2016-09-12 LAB — URINALYSIS, ROUTINE W REFLEX MICROSCOPIC
Bilirubin Urine: NEGATIVE
Glucose, UA: NEGATIVE mg/dL
Hgb urine dipstick: NEGATIVE
KETONES UR: NEGATIVE mg/dL
LEUKOCYTES UA: NEGATIVE
NITRITE: NEGATIVE
PROTEIN: NEGATIVE mg/dL
Specific Gravity, Urine: 1.023 (ref 1.005–1.030)
pH: 7.5 (ref 5.0–8.0)

## 2016-09-12 LAB — URINE MICROSCOPIC-ADD ON
BACTERIA UA: NONE SEEN
RBC / HPF: NONE SEEN RBC/hpf (ref 0–5)
SQUAMOUS EPITHELIAL / LPF: NONE SEEN
WBC, UA: NONE SEEN WBC/hpf (ref 0–5)

## 2016-09-12 MED ORDER — ACETAMINOPHEN 500 MG PO TABS
1000.0000 mg | ORAL_TABLET | Freq: Once | ORAL | Status: DC
  Filled 2016-09-12: qty 2

## 2016-09-12 MED ORDER — IBUPROFEN 200 MG PO TABS
600.0000 mg | ORAL_TABLET | Freq: Once | ORAL | Status: AC
  Administered 2016-09-12: 600 mg via ORAL
  Filled 2016-09-12: qty 3

## 2016-09-12 MED ORDER — LIDOCAINE 5 % EX PTCH: 1.0000 | MEDICATED_PATCH | CUTANEOUS | 0 refills | Status: DC

## 2016-09-12 NOTE — ED Notes (Signed)
PT went to restroom, unable to give urine sample at this time. Will attempt again

## 2016-09-12 NOTE — Discharge Instructions (Signed)

## 2016-09-12 NOTE — ED Triage Notes (Signed)
Pt c/o L lower back pain since Friday. Pt sts it is severe. Pt sts it limits his mobility. Sometimes he can't get up due to the pain. Pt sts pain radiates to front of abdomen. Denies urinary symptoms. Denies hx of kidney stone. A&Ox4 and ambulatory. Denies injury.

## 2016-09-12 NOTE — ED Notes (Signed)
Patient ambulatory and independent at discharge.  Verbalized understanding of discharge instructions. 

## 2016-09-12 NOTE — ED Provider Notes (Signed)
Emergency Department Provider Note   I have reviewed the triage vital signs and the nursing notes.   HISTORY  Chief Complaint Back Pain   HPI Raymond Cuevas is a 82 y.o. male with PMH of GERD, arthritis, and hip replacement presents to the emergency room in for evaluation of acute onset left back and flank pain. Patient states the pain began several days ago. The pain is worse with movement of the left leg. He denies any weakness or numbness in his lower extremity. No bowel or bladder retention or incontinence. No groin numbness. He has not noticed any hematuria, dysuria, or lower abdominal pain. No prior history of chronic back pain. No injury. He has not been taking home medications.   Past Medical History:  Diagnosis Date  . Arthritis    osteoarthritis  . GERD (gastroesophageal reflux disease)    occ.  . 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 Obese 08/28/2013  . 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 CATARACT EXTRACTION, BILATERAL Bilateral   . CHOLECYSTECTOMY     laparoscopic  . EYE SURGERY Left    hole repair  . JOINT REPLACEMENT Right    Knee  . TONSILLECTOMY    . TOTAL HIP ARTHROPLASTY Right 08/27/2013   Procedure: RIGHT TOTAL HIP ARTHROPLASTY ANTERIOR APPROACH;  Surgeon: Mauri Pole, MD;  Location: [facility];  Service: Orthopedics;  Laterality: Right;    Current Outpatient Rx  . Order #: [id number] Class: Historical Med  . Order #: [id number] Class: No Print  . Order #: [id number] Class: Historical Med  . Order #: [id number] Class: Historical Med  . Order #: [id number] Class: No Print  . Order #: [id number] Class: Print    Allergies Codeine; Morphine and related; and Sulfa antibiotics  No family history on file.  Social History Social History  Substance Use Topics  . Smoking status: Never Smoker  . Smokeless tobacco: Not on file  . Alcohol use 0.6 oz/week    1  Cans of beer per week     Comment: rarely    Review of Systems  Constitutional: No fever/chills Eyes: No visual changes. ENT: No sore throat. Cardiovascular: Denies chest pain. Respiratory: Denies shortness of breath. Gastrointestinal: No abdominal pain.  No nausea, no vomiting.  No diarrhea.  No constipation. Genitourinary: Negative for dysuria. Musculoskeletal: Positive for back pain. Skin: Negative for rash. Neurological: Negative for headaches, focal weakness or numbness.  10-point ROS otherwise negative.  ____________________________________________   PHYSICAL EXAM:  VITAL SIGNS: ED Triage Vitals  Enc Vitals Group     BP 09/12/16 1128 141/64     Pulse Rate 09/12/16 1128 68     Resp 09/12/16 1128 15     Temp 09/12/16 1128 97.6 F (36.4 C)     Temp Source 09/12/16 1128 Oral     SpO2 09/12/16 1128 94 %     Pain Score 09/12/16 1133 10   Constitutional: Alert and oriented. Well appearing and in no acute distress. Eyes: Conjunctivae are normal. Head: Atraumatic. Nose: No congestion/rhinnorhea. Mouth/Throat: Mucous membranes are moist.  Oropharynx non-erythematous. Neck: No stridor.  Cardiovascular: Normal rate, regular rhythm. Good peripheral circulation. Grossly normal heart sounds.   Respiratory: Normal respiratory effort.  No retractions. Lungs CTAB. Gastrointestinal: Soft and nontender. No distention.  Musculoskeletal: No lower extremity tenderness nor edema. No gross deformities of extremities. No midline spine tenderness to palpation.  Neurologic:  Normal speech and language. No gross focal neurologic deficits are appreciated. Normal gait without difficulty.  Skin:  Skin is warm, dry and intact. No rash noted.   ____________________________________________   LABS (all labs ordered are listed, but only abnormal results are displayed)  Labs Reviewed  URINALYSIS, ROUTINE W REFLEX MICROSCOPIC (NOT AT [facility]) - Abnormal; Notable for the following:       Result  Value   APPearance TURBID (*)    All other components within normal limits  URINE MICROSCOPIC-ADD ON  I-STAT CHEM 8, ED   ____________________________________________  RADIOLOGY  Dg Lumbar Spine 2-3 Views  Result Date: 09/12/2016 CLINICAL DATA:  Back pain. EXAM: LUMBAR SPINE - 2-3 VIEW COMPARISON:  08/27/2013.  11/18/2008 . FINDINGS: Diffuse multilevel degenerative change. No acute bony abnormality identified. Minimal stable more thoracic compressions. IMPRESSION: Diffuse multilevel degenerative change lumbar spine. Minimal stable lower thoracic vertebral body compressions. Electronically Signed   By: Marcello Moores  Register   On: 09/12/2016 14:56    ____________________________________________   PROCEDURES  Procedure(s) performed:   Procedures  None ____________________________________________   INITIAL IMPRESSION / ASSESSMENT AND PLAN / ED COURSE  Pertinent labs & imaging results that were available during my care of the patient were reviewed by me and considered in my medical decision making (see chart for details).  Patient presents to the emergency room in for evaluation of back pain for the past several days. He has no red flag symptoms that are concerning for spinal cord emergency. Plan for plain films given the patient's age. Plan to also obtain urinalysis and baseline blood work. Will give Tylenol the emergency department. No indication for advanced imaging at this time.   04:27 PM X-ray and labs resulted. No acute abnormality. An for discharge with Lidoderm patch and primary care physician follow-up. Discussed return precautions in detail.   At this time, I do not feel there is any life-threatening condition present. I have reviewed and discussed all results (EKG, imaging, lab, urine as appropriate), exam findings with patient. I have reviewed nursing notes and appropriate previous records.  I feel the patient is safe to be discharged home without further emergent workup.  Discussed usual and customary return precautions. Patient and family (if present) verbalize understanding and are comfortable with this plan.  Patient will follow-up with their primary care provider. If they do not have a primary care provider, information for follow-up has been provided to them. All questions have been answered.  ____________________________________________  FINAL CLINICAL IMPRESSION(S) / ED DIAGNOSES  Final diagnoses:  Acute left-sided low back pain without sciatica     MEDICATIONS GIVEN DURING THIS VISIT:  Medications  ibuprofen (ADVIL,MOTRIN) tablet 600 mg (600 mg Oral Given 09/12/16 1520)     NEW OUTPATIENT MEDICATIONS STARTED DURING THIS VISIT:  Discharge Medication List as of 09/12/2016  4:29 PM    START taking these medications   Details  lidocaine (LIDODERM) 5 % Place 1 patch onto the skin daily. Remove & Discard patch within 12 hours or as directed by MD, Starting Mon 09/12/2016, Print          Note:  This document was prepared using Dragon voice recognition software and may include unintentional dictation errors.  Nanda Quinton, MD Emergency Medicine   Margette Fast, MD 09/13/16 [phone]

## 2016-09-13 DIAGNOSIS — M791 Myalgia: Secondary | ICD-10-CM | POA: Diagnosis not present

## 2016-11-07 DIAGNOSIS — I509 Heart failure, unspecified: Secondary | ICD-10-CM | POA: Diagnosis not present

## 2016-11-07 DIAGNOSIS — I499 Cardiac arrhythmia, unspecified: Secondary | ICD-10-CM | POA: Diagnosis not present

## 2016-11-07 DIAGNOSIS — R0602 Shortness of breath: Secondary | ICD-10-CM | POA: Diagnosis not present

## 2016-11-07 DIAGNOSIS — R05 Cough: Secondary | ICD-10-CM | POA: Diagnosis not present

## 2016-11-11 ENCOUNTER — Telehealth: Payer: Self-pay | Admitting: Student

## 2016-11-11 NOTE — Telephone Encounter (Signed)
Received records from [facility] for appointment on 11/14/16 with Bernerd Pho, [place].  Records put with Brittany's schedule for 11/14/16. lp

## 2016-11-13 NOTE — Progress Notes (Signed)
Cardiology Office Note    Date:  11/14/2016   ID:  Raymond Cuevas, DOB May 11, 1934, MRN [id number]  PCP:  Horatio Pel, MD  Cardiologist:  Seen by Dr. Ellyn Hack 10+ years ago - Wishes to follow with him  Chief Complaint  Patient presents with  . New Patient (Initial Visit)    a. chest pain and dyspnea on exertion    History of Present Illness:    Raymond Cuevas is a 83 y.o. male with past medical history of GERD, arthritis, and hip replacement who presents to the office today as a new patient referral for evaluation of dyspnea on exertion and chest pain.  The patient reports having episodes of chest discomfort and dyspnea with exertion for the past 2 weeks. He has noticed this with activities as simple as making the bed or vacuuming the floors. This does not always occur, as he was able to ambulate from his car to the office today without any dyspnea or chest discomfort. Does occur sporadically when at the grocery store.   He reports episodes of chest discomfort lasts for up to 5 minutes at a time and then resolves with him sitting down and resting. He has not taken anything for the pain. He does check his blood pressure regularly at home and noted systolic readings in the 0000000 to 160s regularly. Has not been on any blood pressure medications.  He was seen by his PCP last week for these symptoms and started on Lasix 40mg  daily. A BNP and CXR were obtained but the results are not available for review. He denies any orthopnea, PND, or lower extremity edema. Symptoms have minimally improved since starting Lasix.  He denies any prior cardiac history. Had a catheterization in the late 1980's/early 1990's which he reports was normal. Last stress test was in the 1990's.   No history of HLD or Type 2 DM. No current or prior tobacco use. Unaware of any family history of CAD. Reports his sister died suddenly at age 20 of unknown cases.    Past Medical History:  Diagnosis Date  .  Arthritis    osteoarthritis  . GERD (gastroesophageal reflux disease)    occ.  . 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 CATARACT EXTRACTION, BILATERAL Bilateral   . CHOLECYSTECTOMY     laparoscopic  . EYE SURGERY Left    hole repair  . JOINT REPLACEMENT Right    Knee  . TONSILLECTOMY    . TOTAL HIP ARTHROPLASTY Right 08/27/2013   Procedure: RIGHT TOTAL HIP ARTHROPLASTY ANTERIOR APPROACH;  Surgeon: Mauri Pole, MD;  Location: [facility];  Service: Orthopedics;  Laterality: Right;    Current Medications: Outpatient Medications Prior to Visit  Medication Sig Dispense Refill  . calcium-vitamin D (OSCAL WITH D) 500-200 MG-UNIT per tablet Take 1 tablet by mouth.    . ferrous sulfate 325 (65 FE) MG tablet Take 1 tablet (325 mg total) by mouth 3 (three) times daily after meals.  3  . fish oil-omega-3 fatty acids 1000 MG capsule Take 1 g by mouth daily.    . magnesium hydroxide (MILK OF MAGNESIA) 400 MG/5ML suspension Take 30 mLs by mouth at bedtime.     . docusate sodium 100 MG CAPS Take 100 mg by mouth 2 (two) times daily. (Patient not taking: Reported on 09/12/2016) 10 capsule 0  . lidocaine (LIDODERM) 5 % Place 1 patch onto the skin daily. Remove &  Discard patch within 12 hours or as directed by MD 30 patch 0   No facility-administered medications prior to visit.      Allergies:   Codeine; Morphine and related; and Sulfa antibiotics   Social History   Social History  . Marital status: Widowed    Spouse name: N/A  . Number of children: N/A  . Years of education: N/A   Social History Main Topics  . Smoking status: Never Smoker  . Smokeless tobacco: Never Used  . Alcohol use 0.6 oz/week    1 Cans of beer per week     Comment: rarely  . Drug use: No  . Sexual activity: No   Other Topics Concern  . None   Social History Narrative  . None     Family History:  The patient's family history includes Sudden death (age of onset:  65) in his sister.   Review of Systems:   Please see the history of present illness.     General:  No chills, fever, night sweats or weight changes.  Cardiovascular:  No orthopnea, palpitations, paroxysmal nocturnal dyspnea. Positive for chest pain and dyspnea on exertion.  Dermatological: No rash, lesions/masses Respiratory: No cough, dyspnea Urologic: No hematuria, dysuria Abdominal:   No nausea, vomiting, diarrhea, bright red blood per rectum, melena, or hematemesis Neurologic:  No visual changes, wkns, changes in mental status. All other systems reviewed and are otherwise negative except as noted above.   Physical Exam:    VS:  BP (!) 158/87 (BP Location: Left Arm)   Pulse (!) 50   Ht 6\' 3"  (1.905 m)   Wt 280 lb 3.2 oz (127.1 kg)   BMI 35.02 kg/m    General: Well developed, well nourished Caucasian male appearing in no acute distress. Head: Normocephalic, atraumatic, sclera non-icteric, no xanthomas, nares are without discharge.  Neck: No carotid bruits. JVD not elevated.  Lungs: Respirations regular and unlabored, without wheezes or rales.  Heart: Regular rate and rhythm. No S3 or S4.  No murmur, no rubs, or gallops appreciated. Abdomen: Soft, non-tender, non-distended with normoactive bowel sounds. No hepatomegaly. No rebound/guarding. No obvious abdominal masses. Msk:  Strength and tone appear normal for age. No joint deformities or effusions. Extremities: No clubbing or cyanosis. No edema.  Distal pedal pulses are 2+ bilaterally. Neuro: Alert and oriented X 3. Moves all extremities spontaneously. No focal deficits noted. Psych:  Responds to questions appropriately with a normal affect. Skin: No rashes or lesions noted  Wt Readings from Last 3 Encounters:  11/14/16 280 lb 3.2 oz (127.1 kg)  09/12/16 267 lb (121.1 kg)  06/08/15 272 lb (123.4 kg)     Studies/Labs Reviewed:   EKG:  EKG is ordered today. The ekg ordered today demonstrates NSR, HR 73, with PAC's. No acute  ST or T-wave changes when compared to prior tracings. Recent Labs: 09/12/2016: BUN 10; Creatinine, Ser 0.90; Hemoglobin 15.0; Potassium 3.9; Sodium 142   Lipid Panel No results found for: CHOL, TRIG, HDL, CHOLHDL, VLDL, LDLCALC, LDLDIRECT   Assessment:    1. Chest pain, unspecified type   2. DOE (dyspnea on exertion)   3. Essential hypertension     Plan:   In order of problems listed above:  1. Chest Pain/ Dyspnea on Exertion - reports worsening chest discomfort and dyspnea with exertion for the past 2 weeks. He has noticed this with activities as simple as making the bed or vacuuming the floors. Able to ambulate for longer distances at times  without symptoms.  - no prior cardiac history. No known HLD or Type 2 DM. No family history of CAD or prior tobacco use. - EKG today is without acute ischemic changes. Will check a Lexiscan Myoview to assess for ischemia. Start Lopressor 12.5mg  BID in the setting of his new symptoms and elevated BP. Reports having in-date SL NTG at home. This was discussed with Dr. Sallyanne Kuster (DOD) as the patient is new to our practice who was in agreement with this assessment and plan.  2. HTN - BP at 158/87 today, rechecked and at 139/82. Reports systolic readings in the 0000000 to 160s regularly when checked at home. - start Lopressor 12.5mg  BID as above.    OF NOTE, when the patient was at checkout he experienced an episode of blurred vision. This spontaneously resolved within seconds. No associated slurred speech or decreased cognition. Blood pressure and oxygen saturations were stable. He was encouraged to have a family member or friend transport him home but declined.  Medication Adjustments/Labs and Tests Ordered: Current medicines are reviewed at length with the patient today.  Concerns regarding medicines are outlined above.  Medication changes, Labs and Tests ordered today are listed in the Patient Instructions below. Patient Instructions  Medication  Instructions:  START taking metoprolol (Lopressor) 12.5mg  (1/2 tablet) two times a day.  Labwork: NONE  Testing/Procedures: Your physician has requested that you have a lexiscan myoview. For further information please visit [email or web address]. Please follow instruction sheet, as given.   Follow-Up: Your physician recommends that you schedule a follow-up appointment in: 2 MONTHS WITH DR. HARDING.  If you need a refill on your cardiac medications before your next appointment, please call your pharmacy.   Signed, Erma Heritage, [place]  11/14/2016 6:16 PM    [facility] [address], [address] [place], [place]  [address] Phone: [phone]; Fax: [phone]  [address], [place], [place] [address] Phone: [phone]

## 2016-11-14 ENCOUNTER — Encounter: Payer: Self-pay | Admitting: Student

## 2016-11-14 ENCOUNTER — Ambulatory Visit (INDEPENDENT_AMBULATORY_CARE_PROVIDER_SITE_OTHER): Payer: Medicare Other | Admitting: Student

## 2016-11-14 VITALS — BP 158/87 | HR 50 | Ht 75.0 in | Wt 280.2 lb

## 2016-11-14 DIAGNOSIS — R079 Chest pain, unspecified: Secondary | ICD-10-CM

## 2016-11-14 DIAGNOSIS — I10 Essential (primary) hypertension: Secondary | ICD-10-CM

## 2016-11-14 DIAGNOSIS — R0609 Other forms of dyspnea: Secondary | ICD-10-CM | POA: Diagnosis not present

## 2016-11-14 MED ORDER — METOPROLOL TARTRATE 25 MG PO TABS: 12.5000 mg | ORAL_TABLET | Freq: Two times a day (BID) | ORAL | 3 refills | Status: DC

## 2016-11-14 NOTE — Patient Instructions (Signed)
Medication Instructions:  START taking metoprolol (Lopressor) 12.5mg  (1/2 tablet) two times a day.  Labwork: NONE  Testing/Procedures: Your physician has requested that you have a lexiscan myoview. For further information please visit [email or web address]. Please follow instruction sheet, as given.   Follow-Up: Your physician recommends that you schedule a follow-up appointment in: 2 MONTHS WITH DR. HARDING.    If you need a refill on your cardiac medications before your next appointment, please call your pharmacy.

## 2016-11-16 ENCOUNTER — Telehealth (HOSPITAL_COMMUNITY): Payer: Self-pay

## 2016-11-16 NOTE — Telephone Encounter (Signed)
Encounter complete. 

## 2016-11-17 DIAGNOSIS — E781 Pure hyperglyceridemia: Secondary | ICD-10-CM | POA: Diagnosis not present

## 2016-11-17 DIAGNOSIS — R0602 Shortness of breath: Secondary | ICD-10-CM | POA: Diagnosis not present

## 2016-11-17 HISTORY — PX: NM MYOVIEW LTD: HXRAD82

## 2016-11-18 ENCOUNTER — Ambulatory Visit (HOSPITAL_COMMUNITY)
Admission: RE | Admit: 2016-11-18 | Discharge: 2016-11-18 | Disposition: A | Discharge status: Home / Self Care | Payer: Medicare Other | Source: Ambulatory Visit | Attending: Cardiology | Admitting: Cardiology

## 2016-11-18 DIAGNOSIS — Z6835 Body mass index (BMI) 35.0-35.9, adult: Secondary | ICD-10-CM | POA: Diagnosis not present

## 2016-11-18 DIAGNOSIS — I10 Essential (primary) hypertension: Secondary | ICD-10-CM | POA: Insufficient documentation

## 2016-11-18 DIAGNOSIS — R079 Chest pain, unspecified: Secondary | ICD-10-CM | POA: Diagnosis not present

## 2016-11-18 DIAGNOSIS — E669 Obesity, unspecified: Secondary | ICD-10-CM | POA: Diagnosis not present

## 2016-11-18 DIAGNOSIS — R0609 Other forms of dyspnea: Secondary | ICD-10-CM | POA: Diagnosis not present

## 2016-11-18 LAB — MYOCARDIAL PERFUSION IMAGING
CHL CUP NUCLEAR SDS: 2
CHL CUP RESTING HR STRESS: 49 {beats}/min
LV dias vol: 110 mL (ref 62–150)
LV sys vol: 46 mL
NUC STRESS TID: 1.13
Peak HR: 64 {beats}/min
SRS: 0
SSS: 2

## 2016-11-18 MED ORDER — REGADENOSON 0.4 MG/5ML IV SOLN
0.4000 mg | Freq: Once | INTRAVENOUS | Status: AC
  Administered 2016-11-18: 0.4 mg via INTRAVENOUS

## 2016-11-18 MED ORDER — TECHNETIUM TC 99M TETROFOSMIN IV KIT
9.8000 | PACK | Freq: Once | INTRAVENOUS | Status: AC | PRN
  Administered 2016-11-18: 9.8 via INTRAVENOUS
  Filled 2016-11-18: qty 10

## 2016-11-18 MED ORDER — TECHNETIUM TC 99M TETROFOSMIN IV KIT
30.6000 | PACK | Freq: Once | INTRAVENOUS | Status: AC | PRN
  Administered 2016-11-18: 30.6 via INTRAVENOUS
  Filled 2016-11-18: qty 31

## 2016-11-21 ENCOUNTER — Telehealth: Payer: Self-pay | Admitting: Cardiology

## 2016-11-21 NOTE — Telephone Encounter (Signed)
Returned call to patient-states he had a missed call from our office.    Advised I called and got disconnected but was calling in regards to his stress test results which someone spoke to him about earlier.  Patient denied further questions or concerns at this time.

## 2016-11-21 NOTE — Telephone Encounter (Signed)
New Message     Returning your call about the lab results

## 2016-11-21 NOTE — Telephone Encounter (Signed)
Attempt to return call to patient to advise of results-lost connection when patient picked up.    Per chart review-results called this AM by Jeanann Lewandowsky RMA.

## 2016-11-21 NOTE — Telephone Encounter (Signed)
New Message   Per pt calling to get test results from Friday. Requesting a call back

## 2016-12-07 DIAGNOSIS — R0602 Shortness of breath: Secondary | ICD-10-CM | POA: Diagnosis not present

## 2017-01-19 ENCOUNTER — Encounter: Payer: Self-pay | Admitting: Cardiology

## 2017-01-19 ENCOUNTER — Ambulatory Visit (INDEPENDENT_AMBULATORY_CARE_PROVIDER_SITE_OTHER): Payer: Medicare Other | Admitting: Cardiology

## 2017-01-19 VITALS — BP 128/88 | HR 63 | Ht 74.0 in | Wt 286.0 lb

## 2017-01-19 DIAGNOSIS — R0609 Other forms of dyspnea: Secondary | ICD-10-CM | POA: Diagnosis not present

## 2017-01-19 DIAGNOSIS — R079 Chest pain, unspecified: Secondary | ICD-10-CM | POA: Diagnosis not present

## 2017-01-19 DIAGNOSIS — I10 Essential (primary) hypertension: Secondary | ICD-10-CM

## 2017-01-19 MED ORDER — FUROSEMIDE 40 MG PO TABS: 40.0000 mg | ORAL_TABLET | Freq: Every day | ORAL | 3 refills | Status: DC

## 2017-01-19 MED ORDER — METOPROLOL TARTRATE 25 MG PO TABS: 12.5000 mg | ORAL_TABLET | Freq: Two times a day (BID) | ORAL | 3 refills | Status: DC

## 2017-01-19 NOTE — Patient Instructions (Signed)
Your physician recommends that you schedule a follow-up appointment in: 6 months with Dr Harding 

## 2017-01-19 NOTE — Progress Notes (Signed)
PCP: Raymond Pel, MD  Clinic Note: Chief Complaint  Patient presents with  . Follow-up    Pt. states no complaints - no furhter CP or DOE    HPI: Raymond Cuevas is a 83 y.o. male with a PMH below who presents today for Essentially 6 week follow-up after being seen by [place] for chest pain evaluation.Raymond Cuevas was last seen on 11/14/2016 by Raymond Pho, PA - he noted having episodes of chest discomfort as well as dyspnea on exertion for the 2 weeks leading up to that visit. Episodes would last maybe 5 minutes and resolved with sitting down.  Recent Hospitalizations: None  Studies Reviewed:   Myoview 11/18/16: EF: 58%. There was no ST segment deviation noted during stress. The study is normal with no suggestion of ischemia or infarction. This is a low risk study.  Interval History: Interestingly, Raymond Cuevas presents today stating that he actually has not any further episodes of chest discomfort since his last visit. He really doesn't notice that her exertional dyspnea either. He is back to walking daily without any difficulties. He had been started on low-dose metoprolol. He says that since he started that along with the Lasix, his dyspnea has improved.   No chest pain or shortness of breath with rest or exertion.  No PND, orthopnea or edema. No palpitations, lightheadedness, dizziness, weakness or syncope/near syncope. No TIA/amaurosis fugax symptoms. No claudication.  ROS: A comprehensive was performed. Review of Systems  Respiratory: Negative for shortness of breath.   Cardiovascular:       Per history of present illness  Gastrointestinal: Negative for blood in stool and melena.  Genitourinary: Negative for hematuria.  Neurological: Negative for dizziness and focal weakness.  All other systems reviewed and are negative.   Past Medical History:  Diagnosis Date  . Arthritis    osteoarthritis  . GERD (gastroesophageal reflux disease)    occ.  . 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 CATARACT EXTRACTION, BILATERAL Bilateral   . CHOLECYSTECTOMY     laparoscopic  . EYE SURGERY Left    hole repair  . JOINT REPLACEMENT Right    Knee  . TONSILLECTOMY    . TOTAL HIP ARTHROPLASTY Right 08/27/2013   Procedure: RIGHT TOTAL HIP ARTHROPLASTY ANTERIOR APPROACH;  Surgeon: Raymond Pole, MD;  Location: [facility];  Service: Orthopedics;  Laterality: Right;    Current Meds  Medication Sig  . calcium-vitamin D (OSCAL WITH D) 500-200 MG-UNIT per tablet Take 1 tablet by mouth.  . ferrous sulfate 325 (65 FE) MG tablet Take 1 tablet (325 mg total) by mouth 3 (three) times daily after meals.  . fish oil-omega-3 fatty acids 1000 MG capsule Take 1 g by mouth daily.  . furosemide (LASIX) 40 MG tablet Take 1 tablet (40 mg total) by mouth daily.  . magnesium hydroxide (MILK OF MAGNESIA) 400 MG/5ML suspension Take 30 mLs by mouth at bedtime.   . metoprolol tartrate (LOPRESSOR) 25 MG tablet Take 0.5 tablets (12.5 mg total) by mouth 2 (two) times daily.  . [DISCONTINUED] furosemide (LASIX) 40 MG tablet Take 40 mg by mouth daily.  . [DISCONTINUED] metoprolol tartrate (LOPRESSOR) 25 MG tablet Take 0.5 tablets (12.5 mg total) by mouth 2 (two) times daily.    Allergies  Allergen Reactions  . Codeine Other (See Comments)    "Walking the walls"  . Morphine And Related Other (See Comments)    "  Walking the walls"  . Sulfa Antibiotics Rash    Social History   Social History  . Marital status: Widowed    Spouse name: N/A  . Number of children: N/A  . Years of education: N/A   Social History Main Topics  . Smoking status: Never Smoker  . Smokeless tobacco: Never Used  . Alcohol use 0.6 oz/week    1 Cans of beer per week     Comment: rarely  . Drug use: No  . Sexual activity: No   Other Topics Concern  . None   Social History Narrative  . None    family history includes Sudden death  (age of onset: 64) in his sister.  Wt Readings from Last 3 Encounters:  01/19/17 286 lb (129.7 kg)  11/18/16 280 lb (127 kg)  11/14/16 280 lb 3.2 oz (127.1 kg)    PHYSICAL EXAM BP 128/88 (BP Location: Left Arm, Patient Position: Sitting, Cuff Size: Normal)   Pulse 63   Ht 6\' 2"  (1.88 m)   Wt 286 lb (129.7 kg)   BMI 36.72 kg/m  General appearance: alert, cooperative, appears stated age, no distress and Moderately obese. Pleasant mood and affect. Well-nourished and well-groomed. Neck: no adenopathy, no carotid bruit and no JVD Lungs: clear to auscultation bilaterally, normal percussion bilaterally and non-labored Heart: regular rate and rhythm, S1 &  S2 normal, no murmur, click, rub or gallop; nondisplaced PMI Abdomen: soft, non-tender; bowel sounds normal; no masses,  no organomegaly; no HJR Extremities: extremities normal, atraumatic, no cyanosis, and no notable edema Neurologic: Mental status: Alert, oriented, thought content appropriate    Adult ECG Report n/a   Other studies Reviewed: Additional studies/ records that were reviewed today include:  Recent Labs:  n/a     ASSESSMENT / PLAN: Problem List Items Addressed This Visit    Dyspnea on exertion    Again this is also improved since last visit. All we did was start low-dose beta blocker, and I think he previously been put on some diuretic. As he was hypertensive at the time, I will suggest that it may be related to deconditioning and potentially some mild diastolic dysfunction mediated dyspnea. Continue low-dose beta blocker and low-dose diuretic. Monitor for recurrence of symptoms.      Essential hypertension (Chronic)    Blood pressure looks much better on low-dose beta blocker.  Continue to monitor. 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       Relevant Medications   metoprolol tartrate (LOPRESSOR) 25 MG tablet   furosemide (LASIX) 40 MG tablet   Exertional chest pain - Primary    Thankfully, his symptoms have improved and he had a negative Myoview with no evidence of ischemia or infarct. Also preserved EF.  Additionally, his symptoms improved since starting on beta blocker and diuretic. Cannot exclude that there was some diastolic dysfunction component involved. His blood pressures also improved now. Would monitor for recurrence of symptoms, if they do recur, we have to consider the possibility of false negative stress test. But for now I think the result is reassuring.         Current medicines are reviewed at length with the patient today. (+/- concerns) n/a The following changes have been made: n/a   Patient Instructions  Your physician recommends that you schedule a follow-up appointment in: 6 months with Dr. Ellyn Hack.  Studies Ordered:   No orders of the defined types were placed in this encounter.     Glenetta Hew, M.D., M.S. Interventional Cardiologist   Pager # [phone] Phone # [phone] [address] [place], [place] [address]

## 2017-01-21 ENCOUNTER — Encounter: Payer: Self-pay | Admitting: Cardiology

## 2017-01-21 DIAGNOSIS — R0609 Other forms of dyspnea: Secondary | ICD-10-CM

## 2017-01-21 DIAGNOSIS — R079 Chest pain, unspecified: Secondary | ICD-10-CM | POA: Insufficient documentation

## 2017-01-21 DIAGNOSIS — I10 Essential (primary) hypertension: Secondary | ICD-10-CM | POA: Insufficient documentation

## 2017-01-21 DIAGNOSIS — R0600 Dyspnea, unspecified: Secondary | ICD-10-CM | POA: Insufficient documentation

## 2017-01-21 NOTE — Assessment & Plan Note (Signed)
Again this is also improved since last visit. All we did was start low-dose beta blocker, and I think he previously been put on some diuretic. As he was hypertensive at the time, I will suggest that it may be related to deconditioning and potentially some mild diastolic dysfunction mediated dyspnea. Continue low-dose beta blocker and low-dose diuretic. Monitor for recurrence of symptoms.

## 2017-01-21 NOTE — Assessment & Plan Note (Signed)
Thankfully, his symptoms have improved and he had a negative Myoview with no evidence of ischemia or infarct. Also preserved EF.  Additionally, his symptoms improved since starting on beta blocker and diuretic. Cannot exclude that there was some diastolic dysfunction component involved. His blood pressures also improved now. Would monitor for recurrence of symptoms, if they do recur, we have to consider the possibility of false negative stress test. But for now I think the result is reassuring.

## 2017-01-21 NOTE — Assessment & Plan Note (Signed)
Blood pressure looks much better on low-dose beta blocker.  Continue to monitor. 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

## 2017-06-01 DIAGNOSIS — K219 Gastro-esophageal reflux disease without esophagitis: Secondary | ICD-10-CM | POA: Diagnosis not present

## 2017-06-01 DIAGNOSIS — Z0000 Encounter for general adult medical examination without abnormal findings: Secondary | ICD-10-CM | POA: Diagnosis not present

## 2017-06-01 DIAGNOSIS — E781 Pure hyperglyceridemia: Secondary | ICD-10-CM | POA: Diagnosis not present

## 2017-06-01 DIAGNOSIS — Z7982 Long term (current) use of aspirin: Secondary | ICD-10-CM | POA: Diagnosis not present

## 2017-06-07 DIAGNOSIS — M1710 Unilateral primary osteoarthritis, unspecified knee: Secondary | ICD-10-CM | POA: Diagnosis not present

## 2017-06-07 DIAGNOSIS — H9193 Unspecified hearing loss, bilateral: Secondary | ICD-10-CM | POA: Diagnosis not present

## 2017-06-07 DIAGNOSIS — J309 Allergic rhinitis, unspecified: Secondary | ICD-10-CM | POA: Diagnosis not present

## 2017-06-07 DIAGNOSIS — Z882 Allergy status to sulfonamides status: Secondary | ICD-10-CM | POA: Diagnosis not present

## 2017-06-12 DIAGNOSIS — M6530 Trigger finger, unspecified finger: Secondary | ICD-10-CM | POA: Diagnosis not present

## 2017-06-12 DIAGNOSIS — M7701 Medial epicondylitis, right elbow: Secondary | ICD-10-CM | POA: Diagnosis not present

## 2017-06-12 DIAGNOSIS — M25521 Pain in right elbow: Secondary | ICD-10-CM | POA: Diagnosis not present

## 2017-06-15 DIAGNOSIS — H40013 Open angle with borderline findings, low risk, bilateral: Secondary | ICD-10-CM | POA: Diagnosis not present

## 2017-06-15 DIAGNOSIS — H26491 Other secondary cataract, right eye: Secondary | ICD-10-CM | POA: Diagnosis not present

## 2017-06-15 DIAGNOSIS — Z961 Presence of intraocular lens: Secondary | ICD-10-CM | POA: Diagnosis not present

## 2017-06-15 DIAGNOSIS — H35361 Drusen (degenerative) of macula, right eye: Secondary | ICD-10-CM | POA: Diagnosis not present

## 2017-06-22 DIAGNOSIS — M25512 Pain in left shoulder: Secondary | ICD-10-CM | POA: Diagnosis not present

## 2017-06-22 DIAGNOSIS — M12812 Other specific arthropathies, not elsewhere classified, left shoulder: Secondary | ICD-10-CM | POA: Diagnosis not present

## 2017-06-22 DIAGNOSIS — M75102 Unspecified rotator cuff tear or rupture of left shoulder, not specified as traumatic: Secondary | ICD-10-CM | POA: Diagnosis not present

## 2017-06-22 DIAGNOSIS — G8929 Other chronic pain: Secondary | ICD-10-CM | POA: Diagnosis not present

## 2017-06-23 DIAGNOSIS — B351 Tinea unguium: Secondary | ICD-10-CM | POA: Diagnosis not present

## 2017-06-26 DIAGNOSIS — Z789 Other specified health status: Secondary | ICD-10-CM | POA: Diagnosis not present

## 2017-07-20 ENCOUNTER — Encounter: Payer: Self-pay | Admitting: Cardiology

## 2017-07-20 ENCOUNTER — Ambulatory Visit (INDEPENDENT_AMBULATORY_CARE_PROVIDER_SITE_OTHER): Payer: Medicare Other | Admitting: Cardiology

## 2017-07-20 VITALS — BP 116/64 | HR 60 | Ht 74.0 in | Wt 276.0 lb

## 2017-07-20 DIAGNOSIS — R079 Chest pain, unspecified: Secondary | ICD-10-CM

## 2017-07-20 DIAGNOSIS — R0609 Other forms of dyspnea: Secondary | ICD-10-CM | POA: Diagnosis not present

## 2017-07-20 DIAGNOSIS — E6609 Other obesity due to excess calories: Secondary | ICD-10-CM

## 2017-07-20 DIAGNOSIS — I10 Essential (primary) hypertension: Secondary | ICD-10-CM

## 2017-07-20 MED ORDER — METOPROLOL TARTRATE 25 MG PO TABS: 12.5000 mg | ORAL_TABLET | Freq: Two times a day (BID) | ORAL | 3 refills | Status: DC

## 2017-07-20 NOTE — Progress Notes (Signed)
PCP: Deland Pretty, MD  Clinic Note: Chief Complaint  Patient presents with  . Follow-up    6 months - h/o CP    HPI: Raymond Cuevas is a 83 y.o. male with a PMH below who presents today for Six-month follow-up after chest pain evaluation back in January 2018.  Raymond Cuevas was last seen on 01/19/2017 following a Myoview on February 2 that showed low risk. Had no further symptoms  Recent Hospitalizations: none  Studies Personally Reviewed - (if available, images/films reviewed: From Epic Chart or Care Everywhere)  n/a  Interval History: Raymond Cuevas returns today for follow doing outstandingly well from a cardiac standpoint. He continues to be a walk about 3 miles a day without any symptoms of chest tightness or pressure. No dyspnea unless he is going uphill. He says that ever since restarting the beta blocker, his exertional dyspnea and any chest discomfort has resolved. No palpitations. He denies any fatigue or lack of energy after starting beta blocker. He is actively trying to work on his weight, noting a 10 pound weight loss since I last saw him. He states that being evaluated for his heart given a wakeup call to get back exercising.  Cardiac Review of Symptoms: He denies any heart failure symptoms of PND, orthopnea or edema.  No palpitations, lightheadedness, dizziness, weakness or syncope/near syncope. No TIA/amaurosis fugax symptoms. No melena, hematochezia, hematuria, or epstaxis. No claudication.  SWF:UXNATF of Systems  Constitutional: Negative for malaise/fatigue.  HENT: Negative for congestion.   Respiratory: Negative for cough and shortness of breath.   Musculoskeletal: Joint pain: Expected arthralgia pains    I have reviewed and (if needed) personally updated the patient's problem list, medications, allergies, past medical and surgical history, social and family history.   Past Medical History:  Diagnosis Date  . Arthritis    osteoarthritis  . GERD  (gastroesophageal reflux disease)    occ.  . 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 CATARACT EXTRACTION, BILATERAL Bilateral   . CHOLECYSTECTOMY     laparoscopic  . EYE SURGERY Left    hole repair  . JOINT REPLACEMENT Right    Knee  . TONSILLECTOMY    . TOTAL HIP ARTHROPLASTY Right 08/27/2013   Procedure: RIGHT TOTAL HIP ARTHROPLASTY ANTERIOR APPROACH;  Surgeon: Mauri Pole, MD;  Location: [facility];  Service: Orthopedics;  Laterality: Right;    Current Meds  Medication Sig  . calcium-vitamin D (OSCAL WITH D) 500-200 MG-UNIT per tablet Take 1 tablet by mouth.  . ferrous sulfate 325 (65 FE) MG tablet Take 1 tablet (325 mg total) by mouth 3 (three) times daily after meals.  . fish oil-omega-3 fatty acids 1000 MG capsule Take 1 g by mouth daily.  . furosemide (LASIX) 40 MG tablet Take 1 tablet (40 mg total) by mouth daily.  . magnesium hydroxide (MILK OF MAGNESIA) 400 MG/5ML suspension Take 30 mLs by mouth at bedtime.     Allergies  Allergen Reactions  . Codeine Other (See Comments)    "Walking the walls"  . Morphine And Related Other (See Comments)    "Walking the walls"  . Sulfa Antibiotics Rash    Social History   Social History  . Marital status: Widowed    Spouse name: N/A  . Number of children: N/A  . Years of education: N/A   Social History Main Topics  . Smoking status: Never Smoker  . Smokeless tobacco:  Never Used  . Alcohol use 0.6 oz/week    1 Cans of beer per week     Comment: rarely  . Drug use: No  . Sexual activity: No   Other Topics Concern  . None   Social History Narrative  . None    family history includes Sudden death (age of onset: 58) in his sister.  Wt Readings from Last 3 Encounters:  07/20/17 276 lb (125.2 kg)  01/19/17 286 lb (129.7 kg)  11/18/16 280 lb (127 kg)    PHYSICAL EXAM BP 116/64   Pulse 60   Ht 6\' 2"  (1.88 m)   Wt 276 lb (125.2 kg)   BMI 35.44 kg/m  Physical Exam    Constitutional: He is oriented to person, place, and time. He appears well-developed and well-nourished. No distress.  Healthy-appearing. Well-groomed  HENT:  Head: Normocephalic and atraumatic.  Neck: No hepatojugular reflux and no JVD present. Carotid bruit is not present.  Cardiovascular: Normal rate, regular rhythm, normal heart sounds and intact distal pulses.  Exam reveals no gallop and no friction rub.   No murmur heard. Pulmonary/Chest: Effort normal and breath sounds normal. No respiratory distress. He has no wheezes. He has no rales.  Abdominal: Soft. Bowel sounds are normal. He exhibits no distension. There is no tenderness.  Musculoskeletal: Normal range of motion. He exhibits no edema.  Neurological: He is alert and oriented to person, place, and time.  Skin: Skin is warm and dry. No erythema.  Psychiatric: He has a normal mood and affect. His behavior is normal. Judgment and thought content normal.  Nursing note and vitals reviewed.   Adult ECG Report n/a  Other studies Reviewed: Additional studies/ records that were reviewed today include:  Recent Labs:  No results found for: CHOL, HDL, LDLCALC, LDLDIRECT, TRIG, CHOLHDL - followed by PCP Lab Results  Component Value Date   CREATININE 0.90 09/12/2016   BUN 10 09/12/2016   NA 142 09/12/2016   K 3.9 09/12/2016   CL 108 09/12/2016   CO2 26 08/29/2013   ASSESSMENT / PLAN: Problem List Items Addressed This Visit    Dyspnea on exertion - Primary    Probably related to elevated blood pressure and mild hypertensive heart disease related diastolic dysfunction.  Doing well with current dose of low-dose beta blocker and diuretic. Okay to follow-up with PCP      Essential hypertension (Chronic)    Blood pressure looks great with beta blocker on board. Would continue current regimen.      Relevant Medications   metoprolol tartrate (LOPRESSOR) 25 MG tablet   Exertional chest pain    No further episodes, and with  negative Myoview on would not pursue any further evaluation at this time. We did start him on a beta blocker along with furosemide for blood probably was diastolic dysfunction mediated symptoms. At this point I think he is stable enough to follow-up with his PCP alone. Would consider the continue the current dose of beta blocker and furosemide.      Obesity due to excess calories without serious comorbidity (Chronic)    Has lost 10 pounds with diet and excised. Encourage his efforts. Continue diet and excised. I suspect this is a large part to do with his blood pressure control and improved exercise tolerance without exertional dyspnea.         Current medicines are reviewed at length with the patient today. (+/- concerns) n/a The following changes have been made: n/a  Patient Instructions  NO CHANGE  WITH MEDICATIONS   REFILL FOR 1 YEAR.    Your physician wants you to follow-up in [place] [address] [place] , IF NO SYMPTOMS YOU CAN CANCEL AN SEE CARDIOLOGIST  ON AN AS NEEDED BASIS. You will receive a reminder letter in the mail two months in advance. If you don't receive a letter, please call our office to schedule the follow-up appointment.     Studies Ordered:   No orders of the defined types were placed in this encounter.    Glenetta Hew, M.D., M.S. Interventional Cardiologist   Pager # [phone] Phone # [phone] [address] [place], [place] [address]

## 2017-07-20 NOTE — Patient Instructions (Signed)
NO CHANGE  WITH MEDICATIONS   REFILL FOR 1 YEAR.    Your physician wants you to follow-up in [place] [address] [place] , IF NO SYMPTOMS YOU CAN CANCEL AN SEE CARDIOLOGIST  ON AN AS NEEDED BASIS. You will receive a reminder letter in the mail two months in advance. If you don't receive a letter, please call our office to schedule the follow-up appointment.

## 2017-07-22 DIAGNOSIS — Z23 Encounter for immunization: Secondary | ICD-10-CM | POA: Diagnosis not present

## 2017-07-22 NOTE — Assessment & Plan Note (Signed)
Blood pressure looks great with beta blocker on board. Would continue current regimen.

## 2017-07-22 NOTE — Assessment & Plan Note (Signed)
Probably related to elevated blood pressure and mild hypertensive heart disease related diastolic dysfunction.  Doing well with current dose of low-dose beta blocker and diuretic. Okay to follow-up with PCP

## 2017-07-22 NOTE — Assessment & Plan Note (Addendum)
Has lost 10 pounds with diet and excised. Encourage his efforts. Continue diet and excised. I suspect this is a large part to do with his blood pressure control and improved exercise tolerance without exertional dyspnea.

## 2017-07-22 NOTE — Assessment & Plan Note (Signed)
No further episodes, and with negative Myoview on would not pursue any further evaluation at this time. We did start him on a beta blocker along with furosemide for blood probably was diastolic dysfunction mediated symptoms. At this point I think he is stable enough to follow-up with his PCP alone. Would consider the continue the current dose of beta blocker and furosemide.

## 2017-10-12 DIAGNOSIS — M25511 Pain in right shoulder: Secondary | ICD-10-CM | POA: Diagnosis not present

## 2017-10-12 DIAGNOSIS — M75101 Unspecified rotator cuff tear or rupture of right shoulder, not specified as traumatic: Secondary | ICD-10-CM | POA: Diagnosis not present

## 2017-10-12 DIAGNOSIS — M12811 Other specific arthropathies, not elsewhere classified, right shoulder: Secondary | ICD-10-CM | POA: Diagnosis not present

## 2017-10-12 DIAGNOSIS — G8929 Other chronic pain: Secondary | ICD-10-CM | POA: Diagnosis not present

## 2017-12-15 HISTORY — PX: TRANSTHORACIC ECHOCARDIOGRAM: SHX275

## 2018-01-02 ENCOUNTER — Telehealth: Payer: Self-pay | Admitting: Cardiology

## 2018-01-02 MED ORDER — FUROSEMIDE 40 MG PO TABS: 40.0000 mg | ORAL_TABLET | Freq: Every day | ORAL | 2 refills | Status: DC

## 2018-01-02 NOTE — Addendum Note (Signed)
Addended by: Waylan Rocher on: 01/02/2018 01:57 PM   Modules accepted: Orders

## 2018-01-02 NOTE — Telephone Encounter (Signed)
°*  STAT* If patient is at the pharmacy, call can be transferred to refill team.   1. Which medications need to be refilled? (please list name of each medication and dose if known)  Furosemide 40 mg  2. Which pharmacy/location (including street and city if local pharmacy) is medication to be sent to?[place], [address]  3. Do they need a 30 day or 90 day supply? [place]

## 2018-01-08 ENCOUNTER — Other Ambulatory Visit: Payer: Self-pay | Admitting: Internal Medicine

## 2018-01-08 ENCOUNTER — Ambulatory Visit
Admission: RE | Admit: 2018-01-08 | Discharge: 2018-01-08 | Disposition: A | Discharge status: Home / Self Care | Payer: Medicare Other | Source: Ambulatory Visit | Attending: Internal Medicine | Admitting: Internal Medicine

## 2018-01-08 DIAGNOSIS — R635 Abnormal weight gain: Secondary | ICD-10-CM | POA: Diagnosis not present

## 2018-01-08 DIAGNOSIS — R0609 Other forms of dyspnea: Secondary | ICD-10-CM | POA: Diagnosis not present

## 2018-01-08 DIAGNOSIS — R000 Tachycardia, unspecified: Secondary | ICD-10-CM | POA: Diagnosis not present

## 2018-01-08 DIAGNOSIS — E669 Obesity, unspecified: Secondary | ICD-10-CM | POA: Diagnosis not present

## 2018-01-08 DIAGNOSIS — R0602 Shortness of breath: Secondary | ICD-10-CM | POA: Diagnosis not present

## 2018-01-08 MED ORDER — IOPAMIDOL (ISOVUE-370) INJECTION 76%
75.0000 mL | Freq: Once | INTRAVENOUS | Status: AC | PRN
  Administered 2018-01-08: 75 mL via INTRAVENOUS

## 2018-01-10 ENCOUNTER — Other Ambulatory Visit: Payer: Self-pay | Admitting: Internal Medicine

## 2018-01-10 DIAGNOSIS — R0609 Other forms of dyspnea: Principal | ICD-10-CM

## 2018-01-11 ENCOUNTER — Ambulatory Visit (HOSPITAL_COMMUNITY): Payer: Medicare Other | Attending: Cardiology

## 2018-01-11 ENCOUNTER — Ambulatory Visit (INDEPENDENT_AMBULATORY_CARE_PROVIDER_SITE_OTHER): Payer: Medicare Other | Admitting: Physician Assistant

## 2018-01-11 ENCOUNTER — Encounter: Payer: Self-pay | Admitting: Physician Assistant

## 2018-01-11 ENCOUNTER — Other Ambulatory Visit: Payer: Self-pay

## 2018-01-11 VITALS — BP 116/66 | HR 68 | Ht 74.0 in | Wt 285.0 lb

## 2018-01-11 DIAGNOSIS — R079 Chest pain, unspecified: Secondary | ICD-10-CM | POA: Insufficient documentation

## 2018-01-11 DIAGNOSIS — E6609 Other obesity due to excess calories: Secondary | ICD-10-CM | POA: Diagnosis not present

## 2018-01-11 DIAGNOSIS — I10 Essential (primary) hypertension: Secondary | ICD-10-CM | POA: Diagnosis not present

## 2018-01-11 DIAGNOSIS — R0609 Other forms of dyspnea: Secondary | ICD-10-CM

## 2018-01-11 DIAGNOSIS — I119 Hypertensive heart disease without heart failure: Secondary | ICD-10-CM | POA: Insufficient documentation

## 2018-01-11 DIAGNOSIS — E669 Obesity, unspecified: Secondary | ICD-10-CM | POA: Insufficient documentation

## 2018-01-11 DIAGNOSIS — Z6835 Body mass index (BMI) 35.0-35.9, adult: Secondary | ICD-10-CM | POA: Diagnosis not present

## 2018-01-11 NOTE — Patient Instructions (Addendum)
Medication Instructions:  Your physician recommends that you continue on your current medications as directed. Please refer to the Current Medication list given to you today.  Labwork: None   Testing/Procedures: None   Follow-Up: Your physician recommends that you schedule a follow-up appointment in: FIRST available with Dr Ellyn Hack.  Any Other Special Instructions Will Be Listed Below (If Applicable).  If you need a refill on your cardiac medications before your next appointment, please call your pharmacy.

## 2018-01-11 NOTE — Progress Notes (Signed)
Cardiology Office Note   Date:  01/11/2018   ID:  Raymond Cuevas, DOB May 01, 1934, MRN [id number]  PCP:  Deland Pretty, MD  Cardiologist: Dr. Ellyn Hack, 07/22/2017 Rosaria Ferries, PA-C   Chief Complaint  Patient presents with  . Follow-up    History of Present Illness: Raymond Cuevas is a 84 y.o. male with a history of chest pain 10/2016>>s/p low risk MV, OA, GERD, DOE improved on BB, obesity.  Raymond Cuevas presents for cardiology follow up.  He takes 1/2 metoprolol in the morning and 1/2 at night.   He eats a meal between 2:30 and 5:30 pm, he eats out, generally fast food. He will get chicken strips or other protein source. He eats bananas in the morning and does a protein shake during the day as well.  Does not understand why he has gained weight.   He is aware that his weight is up some, but denies LE edema, no orthopnea or PND. Chronically sleeps on 2 pillows on his R side.  The most strenuous thing he does is house work, he does not get chest pain with this.   He feels he does not have the energy he used to have. He walks the [employer] 2 laps and will then buy something. He gets DOE doing this, it has been worse the last 3-4 months. He noticed this before Christmas, it got a little better on the metoprolol, but it is not where he wants it.    He remembers symptoms like this in previous winters, wonders if he gets like this just because of the shorter days and spending more time inside.  He honestly believes he will get better once he can start spending more time outside.  He has orthostatic weakness, has to stand for a second or 2 when he changes position because he feels weak.  This is not new or different, it has not changed recently.   Past Medical History:  Diagnosis Date  . Arthritis    osteoarthritis  . GERD (gastroesophageal reflux disease)    occ.  . 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  CATARACT EXTRACTION, BILATERAL Bilateral   . CHOLECYSTECTOMY     laparoscopic  . EYE SURGERY Left    hole repair  . JOINT REPLACEMENT Right    Knee  . TONSILLECTOMY    . TOTAL HIP ARTHROPLASTY Right 08/27/2013   Procedure: RIGHT TOTAL HIP ARTHROPLASTY ANTERIOR APPROACH;  Surgeon: Mauri Pole, MD;  Location: [facility];  Service: Orthopedics;  Laterality: Right;    Current Outpatient Medications  Medication Sig Dispense Refill  . calcium-vitamin D (OSCAL WITH D) 500-200 MG-UNIT per tablet Take 1 tablet by mouth.    . ferrous sulfate 325 (65 FE) MG tablet Take 1 tablet (325 mg total) by mouth 3 (three) times daily after meals.  3  . fish oil-omega-3 fatty acids 1000 MG capsule Take 1 g by mouth daily.    . furosemide (LASIX) 40 MG tablet Take 1 tablet (40 mg total) by mouth daily. 90 tablet 2  . magnesium hydroxide (MILK OF MAGNESIA) 400 MG/5ML suspension Take 30 mLs by mouth at bedtime.     . metoprolol tartrate (LOPRESSOR) 25 MG tablet Take 0.5 tablets (12.5 mg total) by mouth 2 (two) times daily. 90 tablet 3   No current facility-administered medications for this visit.     Allergies:   Codeine; Morphine and related;  and Sulfa antibiotics    Social History:  The patient  reports that he has never smoked. He has never used smokeless tobacco. He reports that he drinks about 0.6 oz of alcohol per week. He reports that he does not use drugs.   Family History:  The patient's family history includes Sudden death (age of onset: 15) in his sister.    ROS:  Please see the history of present illness. All other systems are reviewed and negative.    PHYSICAL EXAM: VS:  BP 116/66   Pulse 68   Ht 6\' 2"  (1.88 m)   Wt 285 lb (129.3 kg)   BMI 36.59 kg/m  , BMI Body mass index is 36.59 kg/m. GEN: Well nourished, well developed, male in no acute distress  HEENT: normal for age  Neck: no JVD, no carotid bruit, no masses Cardiac: RRR; 2/6 murmur, no rubs, or gallops Respiratory:  clear to  auscultation bilaterally, normal work of breathing GI: soft, nontender, nondistended, + BS MS: no deformity or atrophy; no edema; distal pulses are 2+ in all 4 extremities   Skin: warm and dry, no rash Neuro:  Strength and sensation are intact Psych: euthymic mood, full affect   EKG:  EKG is ordered today. The ekg ordered today demonstrates SR, HR 68, no acute ischemic changes  ECHO: 01/11/2018 - Left ventricle: The cavity size was normal. Wall thickness was   increased in a pattern of mild LVH. Systolic function was normal.   The estimated ejection fraction was in the range of 55% to 60%.   Doppler parameters are consistent with abnormal left ventricular   relaxation (grade 1 diastolic dysfunction). - Aortic valve: There was trivial regurgitation. - Mean gradient (S): 19 mm Hg. Peak gradient (S): 31 mm Hg. -PAS 28   MYOVIEW: 11/18/2016  The left ventricular ejection fraction is normal (55-65%).  Nuclear stress EF: 58%.  There was no ST segment deviation noted during stress.  The study is normal.  This is a low risk study.  Ct Angio Chest Pe W Or Wo Contrast  Result Date: 01/08/2018 CLINICAL DATA:  Shortness of breath for 3 months EXAM: CT ANGIOGRAPHY CHEST WITH CONTRAST TECHNIQUE: Multidetector CT imaging of the chest was performed using the standard protocol during bolus administration of intravenous contrast. Multiplanar CT image reconstructions and MIPs were obtained to evaluate the vascular anatomy. CONTRAST:  50mL ISOVUE-370 IOPAMIDOL (ISOVUE-370) INJECTION 76% COMPARISON:  None. FINDINGS: Cardiovascular: Satisfactory opacification of the pulmonary arteries to the segmental level. No evidence of pulmonary embolism. Normal heart size. No pericardial effusion. Normal caliber thoracic aorta. No thoracic aortic dissection. Mild coronary artery atherosclerosis in the LAD. Mediastinum/Nodes: No enlarged mediastinal, hilar, or axillary lymph nodes. Thyroid gland, trachea, and  esophagus demonstrate no significant findings. Lungs/Pleura: No focal consolidation. 4 mm subpleural right lower lobe pulmonary nodule. 5 mm left lower lobe pulmonary nodule. No pleural effusion or pneumothorax. Upper Abdomen: No acute abnormality. Musculoskeletal: Chronic T3 anterior vertebral body compression fracture. No aggressive osseous lesion. No acute osseous abnormality Review of the MIP images confirms the above findings. IMPRESSION: 1. No evidence pulmonary embolus. 2. No thoracic aortic dissection or aneurysm. 3. 4 mm right lower lobe and 5 mm left lower lobe pulmonary nodular. If the patient is at high risk for bronchogenic carcinoma, follow-up chest CT at 6-12 months is recommended. If the patient is at low risk for bronchogenic carcinoma, follow-up chest CT at 12 months is recommended. This recommendation follows the consensus statement: Guidelines for  Management of Small Pulmonary Nodules Detected on CT Scans: A Statement from the [place] as published in Radiology 2005;237:395-400. Electronically Signed   By: Kathreen Devoid   On: 01/08/2018 11:51    Recent Labs: No results found for requested labs within last 8760 hours.    Lipid Panel No results found for: CHOL, TRIG, HDL, CHOLHDL, VLDL, LDLCALC, LDLDIRECT   Wt Readings from Last 3 Encounters:  01/11/18 285 lb (129.3 kg)  07/20/17 276 lb (125.2 kg)  01/19/17 286 lb (129.7 kg)     Other studies Reviewed: Additional studies/ records that were reviewed today include: office notes and testing.  ASSESSMENT AND PLAN:  1.  DOE: Dr. Shelia Media did a chest CT that was negative for PE.  A Myoview last year was low risk.  An echocardiogram done today shows mild aortic stenosis, normal EF and grade 1 diastolic dysfunction.   Normal PAS.  Dr. Shelia Media is doing blood work.  I will not draw labs or do any further testing.  He is to follow-up with Dr. Ellyn Hack and review the results of the testing Dr. Shelia Media has done at that time.  Then decide  any further evaluation needed.  He may also have an element of SAD (seasonal affective disorder), and may feel better as the weather improves, the temperature warms and the days get longer.  2.  Hypertension: His blood pressure is well controlled on current therapy.  3.  Obesity: He is encouraged to pay closer attention to what he eats and spread out meals throughout the day.  Current medicines are reviewed at length with the patient today.  The patient does not have concerns regarding medicines.  The following changes have been made:  no change  Labs/ tests ordered today include:  No orders of the defined types were placed in this encounter.    Disposition:   FU with Dr. Ellyn Hack  Signed, Rosaria Ferries, PA-C  01/11/2018 3:06 PM    [place] Phone: [phone]; Fax: [phone]  This note was written with the assistance of speech recognition software. Please excuse any transcriptional errors.

## 2018-01-13 DIAGNOSIS — M545 Low back pain: Secondary | ICD-10-CM | POA: Diagnosis not present

## 2018-01-13 DIAGNOSIS — M62838 Other muscle spasm: Secondary | ICD-10-CM | POA: Diagnosis not present

## 2018-01-15 DIAGNOSIS — J45902 Unspecified asthma with status asthmaticus: Secondary | ICD-10-CM | POA: Diagnosis not present

## 2018-01-15 DIAGNOSIS — J4521 Mild intermittent asthma with (acute) exacerbation: Secondary | ICD-10-CM | POA: Diagnosis not present

## 2018-02-07 ENCOUNTER — Institutional Professional Consult (permissible substitution): Payer: Medicare Other | Admitting: Pulmonary Disease

## 2018-02-09 ENCOUNTER — Other Ambulatory Visit (INDEPENDENT_AMBULATORY_CARE_PROVIDER_SITE_OTHER): Payer: Medicare Other

## 2018-02-09 ENCOUNTER — Ambulatory Visit (INDEPENDENT_AMBULATORY_CARE_PROVIDER_SITE_OTHER): Payer: Medicare Other | Admitting: Pulmonary Disease

## 2018-02-09 ENCOUNTER — Ambulatory Visit
Admission: RE | Admit: 2018-02-09 | Discharge: 2018-02-09 | Disposition: A | Discharge status: Home / Self Care | Payer: Self-pay | Source: Ambulatory Visit | Attending: Pulmonary Disease | Admitting: Pulmonary Disease

## 2018-02-09 ENCOUNTER — Encounter: Payer: Self-pay | Admitting: Pulmonary Disease

## 2018-02-09 VITALS — BP 132/74 | HR 70 | Ht 74.0 in | Wt 278.0 lb

## 2018-02-09 DIAGNOSIS — R0602 Shortness of breath: Secondary | ICD-10-CM

## 2018-02-09 LAB — CBC WITH DIFFERENTIAL/PLATELET
Basophils Absolute: 0 10*3/uL (ref 0.0–0.1)
Basophils Relative: 0.8 % (ref 0.0–3.0)
EOS PCT: 3.9 % (ref 0.0–5.0)
Eosinophils Absolute: 0.2 10*3/uL (ref 0.0–0.7)
HCT: 45 % (ref 39.0–52.0)
Hemoglobin: 15.7 g/dL (ref 13.0–17.0)
LYMPHS ABS: 1.4 10*3/uL (ref 0.7–4.0)
Lymphocytes Relative: 22.9 % (ref 12.0–46.0)
MCHC: 34.9 g/dL (ref 30.0–36.0)
MCV: 92.9 fl (ref 78.0–100.0)
MONO ABS: 0.8 10*3/uL (ref 0.1–1.0)
MONOS PCT: 13.9 % — AB (ref 3.0–12.0)
NEUTROS PCT: 58.5 % (ref 43.0–77.0)
Neutro Abs: 3.5 10*3/uL (ref 1.4–7.7)
PLATELETS: 327 10*3/uL (ref 150.0–400.0)
RBC: 4.84 Mil/uL (ref 4.22–5.81)
RDW: 14 % (ref 11.5–15.5)
WBC: 5.9 10*3/uL (ref 4.0–10.5)

## 2018-02-09 LAB — NITRIC OXIDE: Nitric Oxide: 19

## 2018-02-09 NOTE — Patient Instructions (Addendum)
I am glad that you are feeling better after recent episode of bronchitis Your lungs sound clear today. We will schedule you for pulmonary function test and some blood test today including CBC with differential and blood allergy profile Follow-up after these tests for review.

## 2018-02-09 NOTE — Progress Notes (Signed)
Raymond Cuevas    [id number]    Feb 14, 1934  Primary Care Physician:Pharr, Thayer Jew, MD  Referring Physician: Deland Pretty, MD [address] [place], [place] [address]  Chief complaint: Consult for asthma  HPI: 84 year old with history of GERD, allergic rhinitis.  Seen by primary care on 01/15/18 for cough, wheezing, diagnosed with asthmatic bronchitis and treated with azithromycin and steroids.  Chest x-ray some mild scarring at the base with no acute process. He states that his breathing is much improved today.  Denies any cough, sputum production, wheezing He has been outside doing yard work and mowing the lawn yesterday without any shortness of breath or respiratory issues  He had a CT angio in March 2019 which did not show any pulmonary embolism or acute lung abnormality.  There were 2 subcentimeter pulmonary nodules in the right lower lobe and left lower lobe  Pets: No pets.  He has a bird feeder in front yard and is exposed to wild birds Occupation: Retired Administrator Exposures: No dampness, mold , jacuzzi,, hot tub Smoking history: Never smoker Travel history: Grew up in [place].  No significant travel  Outpatient Encounter Medications as of 02/09/2018  Medication Sig  . calcium-vitamin D (OSCAL WITH D) 500-200 MG-UNIT per tablet Take 1 tablet by mouth.  . ferrous sulfate 325 (65 FE) MG tablet Take 1 tablet (325 mg total) by mouth 3 (three) times daily after meals.  . fish oil-omega-3 fatty acids 1000 MG capsule Take 1 g by mouth daily.  . furosemide (LASIX) 40 MG tablet Take 1 tablet (40 mg total) by mouth daily.  . magnesium hydroxide (MILK OF MAGNESIA) 400 MG/5ML suspension Take 30 mLs by mouth at bedtime.   . metoprolol tartrate (LOPRESSOR) 25 MG tablet Take 0.5 tablets (12.5 mg total) by mouth 2 (two) times daily.   No facility-administered encounter medications on file as of 02/09/2018.     Allergies as of 02/09/2018 - Review Complete  02/09/2018  Allergen Reaction Noted  . Codeine Other (See Comments) 04/05/2013  . Morphine and related Other (See Comments) 04/05/2013  . Sulfa antibiotics Rash 08/13/2013    Past Medical History:  Diagnosis Date  . Arthritis    osteoarthritis  . GERD (gastroesophageal reflux disease)    occ.  . 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 CATARACT EXTRACTION, BILATERAL Bilateral   . CHOLECYSTECTOMY     laparoscopic  . EYE SURGERY Left    hole repair  . JOINT REPLACEMENT Right    Knee  . REPLACEMENT TOTAL KNEE    . TONSILLECTOMY    . TOTAL HIP ARTHROPLASTY Right 08/27/2013   Procedure: RIGHT TOTAL HIP ARTHROPLASTY ANTERIOR APPROACH;  Surgeon: Mauri Pole, MD;  Location: [facility];  Service: Orthopedics;  Laterality: Right;    Family History  Problem Relation Age of Onset  . Sudden death Sister 60    Social History   Socioeconomic History  . Marital status: Widowed    Spouse name: Not on file  . Number of children: Not on file  . Years of education: Not on file  . Highest education level: Not on file  Occupational History  . Not on file  Social Needs  . Financial resource strain: Not on file  . Food insecurity:    Worry: Not on file    Inability: Not on file  . Transportation needs:  Medical: Not on file    Non-medical: Not on file  Tobacco Use  . Smoking status: Never Smoker  . Smokeless tobacco: Never Used  Substance and Sexual Activity  . Alcohol use: Yes    Alcohol/week: 0.6 oz    Types: 1 Cans of beer per week    Comment: rarely  . Drug use: No  . Sexual activity: Never    Birth control/protection: None  Lifestyle  . Physical activity:    Days per week: Not on file    Minutes per session: Not on file  . Stress: Not on file  Relationships  . Social connections:    Talks on phone: Not on file    Gets together: Not on file    Attends religious service: Not on file    Active member of club or  organization: Not on file    Attends meetings of clubs or organizations: Not on file    Relationship status: Not on file  . Intimate partner violence:    Fear of current or ex partner: Not on file    Emotionally abused: Not on file    Physically abused: Not on file    Forced sexual activity: Not on file  Other Topics Concern  . Not on file  Social History Narrative  . Not on file    Review of systems: Review of Systems  Constitutional: Negative for fever and chills.  HENT: Negative.   Eyes: Negative for blurred vision.  Respiratory: as per HPI  Cardiovascular: Negative for chest pain and palpitations.  Gastrointestinal: Negative for vomiting, diarrhea, blood per rectum. Genitourinary: Negative for dysuria, urgency, frequency and hematuria.  Musculoskeletal: Negative for myalgias, back pain and joint pain.  Skin: Negative for itching and rash.  Neurological: Negative for dizziness, tremors, focal weakness, seizures and loss of consciousness.  Endo/Heme/Allergies: Negative for environmental allergies.  Psychiatric/Behavioral: Negative for depression, suicidal ideas and hallucinations.  All other systems reviewed and are negative.  Physical Exam: Blood pressure 132/74, pulse 70, height 6\' 2"  (1.88 m), weight 278 lb (126.1 kg), SpO2 95 %. Gen:      No acute distress HEENT:  EOMI, sclera anicteric Neck:     No masses; no thyromegaly Lungs:    Clear to auscultation bilaterally; normal respiratory effort CV:         Regular rate and rhythm; no murmurs Abd:      + bowel sounds; soft, non-tender; no palpable masses, no distension Ext:    No edema; adequate peripheral perfusion Skin:      Warm and dry; no rash Neuro: alert and oriented x 3 Psych: normal mood and affect  Data Reviewed: Chest x-ray 01/15/17 Mild scarring at the left base, no edema or consolidation.  Stable cardiac silhouette.  CT chest 01/08/2018 No acute lung process.  Mild atelectasis at the bases.  4 mm right  lower lobe, 5 mm left lower lobe nodule. Reviewed images personally.    FENO 02/09/2018-19  Assessment:  Evaluation of dyspnea, wheezing Referred here after a recent episode of bronchitis with reactive airway disease He appears to have responded well to the Z-Pak and steroid with no respiratory complaints today We will evaluate with PFTs, CBC with differential and a blood allergy profile Currently off inhalers.  No need to start any therapy as he is asymptomatic  Subcentimeter pulmonary nodule Recent lung imaging reviewed with no acute process.  There are subcentimeter pulmonary nodules which are likely benign and can be followed with CT in  1 year  Plan/Recommendations: - PFTs, CBC with differential, blood allergy profile  [name] [facility] 02/09/2018, 8:45 AM  CC: Deland Pretty, MD

## 2018-02-12 ENCOUNTER — Encounter: Payer: Self-pay | Admitting: Cardiology

## 2018-02-12 ENCOUNTER — Ambulatory Visit (INDEPENDENT_AMBULATORY_CARE_PROVIDER_SITE_OTHER): Payer: Medicare Other | Admitting: Cardiology

## 2018-02-12 VITALS — BP 132/72 | HR 55 | Ht 74.0 in | Wt 283.8 lb

## 2018-02-12 DIAGNOSIS — R0609 Other forms of dyspnea: Secondary | ICD-10-CM | POA: Diagnosis not present

## 2018-02-12 DIAGNOSIS — R079 Chest pain, unspecified: Secondary | ICD-10-CM

## 2018-02-12 DIAGNOSIS — I10 Essential (primary) hypertension: Secondary | ICD-10-CM | POA: Diagnosis not present

## 2018-02-12 LAB — RESPIRATORY ALLERGY PROFILE REGION II ~~LOC~~
Allergen, A. alternata, m6: <0.1 kU/L
Allergen, Comm Silver Birch, t9: <0.1 kU/L
Allergen, Cottonwood, t14: <0.1 kU/L
Allergen, Mouse Urine Protein, e78: <0.1 kU/L
Allergen, Oak,t7: <0.1 kU/L
Allergen, P. notatum, m1: <0.1 kU/L
Aspergillus fumigatus, m3: <0.1 kU/L
Bermuda Grass: <0.1 kU/L
CLADOSPORIUM HERBARUM (M2) IGE: <0.1 kU/L
CLASS: 0
CLASS: 0
CLASS: 0
CLASS: 0
CLASS: 0
CLASS: 0
CLASS: 0
CLASS: 0
CLASS: 0
CLASS: 0
CLASS: 0
COMMON RAGWEED (SHORT) (W1) IGE: <0.1 kU/L
Cat Dander: <0.1 kU/L
Class: 0
Class: 0
Class: 0
Class: 0
Class: 0
Class: 0
Class: 0
Class: 0
Class: 0
Class: 0
Class: 0
Class: 0
Class: 0
Cockroach: <0.1 kU/L
D. farinae: <0.1 kU/L
Dog Dander: <0.1 kU/L
IGE (IMMUNOGLOBULIN E), SERUM: 20 kU/L (ref <=114)
Pecan/Hickory Tree IgE: 0.25 kU/L — ABNORMAL HIGH
Sheep Sorrel IgE: <0.1 kU/L
Timothy Grass: <0.1 kU/L

## 2018-02-12 LAB — INTERPRETATION:

## 2018-02-12 NOTE — Patient Instructions (Signed)
No change to current medication or treatment.      Your physician wants you to follow-up in 53 month with DR HARDING. You will receive a reminder letter in the mail two months in advance. If you don't receive a letter, please call our office to schedule the follow-up appointment.    If you need a refill on your cardiac medications before your next appointment, please call your pharmacy.

## 2018-02-12 NOTE — Progress Notes (Signed)
PCP: Deland Pretty, MD  Clinic Note: Chief Complaint  Patient presents with  . Follow-up    pt states no concerns, denies chest pains, SOB, and swelling in hands/feet    HPI: Raymond Cuevas is a 84 y.o. male with a PMH below who presents today for 1 -month follow-up for exertional dyspnea. I last saw him in October 2018.  He is doing relatively well.  Was able to walk about 3 miles a day without symptoms.  Only noted exertional dyspnea walking uphill.  Had lost 10 pounds since his last visit.  [name] was recently seen on March 28 by Rosaria Ferries, PA --> noted that his weight was up some but denied any edema PND orthopnea.  Noted reduced energy and occasionally getting exertional dyspnea walking the "[employer]".  2D echo was ordered to evaluate PCP check (CT scan for PE, and since last Myoview was low risk this would not recheck) No sensation of chest pain with housework.  Recent Hospitalizations: none  Studies Personally Reviewed - (if available, images/films reviewed: From Epic Chart or Care Everywhere)  2D Echo January 11, 2018: Mild LVH.  Normal EF 55-60%.  GR 1 DD.  No RWMA  Interval History: Mr. Asencio returns today doing well.  Up every day ~ 7 Am - ready to go.  Walks daily up to 3 miles & mows lawn etc.  Really feels like he is back to baseline or better.  He actually feels like being on the beta-blocker gives him the ability to relax and not noticed as much stress. He is not having any exertional chest tightness or pressure.  No dyspnea.  With the beta-blocker on board, he is not noticing any rapid irregular heartbeats or palpitations.  Cardiovascular Review of Symptoms: no chest pain or dyspnea on exertion negative for - edema, irregular heartbeat, orthopnea, palpitations or paroxysmal nocturnal dyspnea  No melena, hematochezia, hematuria, or epstaxis. No claudication.  YPP:JKDTOI of Systems  Constitutional: Negative for malaise/fatigue.  HENT:  Negative for congestion.   Respiratory: Negative for cough and shortness of breath.   Musculoskeletal: Positive for joint pain (Expected arthralgia pains ).   I have reviewed and (if needed) personally updated the patient's problem list, medications, allergies, past medical and surgical history, social and family history.   Past Medical History:  Diagnosis Date  . Arthritis    osteoarthritis  . GERD (gastroesophageal reflux disease)    occ.  . 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 CATARACT EXTRACTION, BILATERAL Bilateral   . CHOLECYSTECTOMY     laparoscopic  . EYE SURGERY Left    hole repair  . JOINT REPLACEMENT Right    Knee  . REPLACEMENT TOTAL KNEE    . TONSILLECTOMY    . TOTAL HIP ARTHROPLASTY Right 08/27/2013   Procedure: RIGHT TOTAL HIP ARTHROPLASTY ANTERIOR APPROACH;  Surgeon: Mauri Pole, MD;  Location: [facility];  Service: Orthopedics;  Laterality: Right;    Current Meds  Medication Sig  . calcium-vitamin D (OSCAL WITH D) 500-200 MG-UNIT per tablet Take 1 tablet by mouth.  . ferrous sulfate 325 (65 FE) MG tablet Take 1 tablet (325 mg total) by mouth 3 (three) times daily after meals.  . fish oil-omega-3 fatty acids 1000 MG capsule Take 1 g by mouth daily.  . furosemide (LASIX) 40 MG tablet Take 1 tablet (40 mg total) by mouth daily.  . magnesium hydroxide (MILK OF MAGNESIA)  400 MG/5ML suspension Take 30 mLs by mouth at bedtime.     Allergies  Allergen Reactions  . Codeine Other (See Comments)    "Walking the walls"  . Morphine And Related Other (See Comments)    "Walking the walls"  . Sulfa Antibiotics Rash    Social History   Tobacco Use  . Smoking status: Never Smoker  . Smokeless tobacco: Never Used  Substance Use Topics  . Alcohol use: Yes    Alcohol/week: 0.6 oz    Types: 1 Cans of beer per week    Comment: rarely  . Drug use: No   Social History   Social History Narrative  . Not on file    Family History family history includes Sudden death (age of onset: 44) in his sister.  Wt Readings from Last 3 Encounters:  02/12/18 283 lb 12.8 oz (128.7 kg)  02/09/18 278 lb (126.1 kg)  01/11/18 285 lb (129.3 kg)    PHYSICAL EXAM BP 132/72 (BP Location: Left Arm)   Pulse (!) 55   Ht 6\' 2"  (1.88 m)   Wt 283 lb 12.8 oz (128.7 kg)   BMI 36.44 kg/m  Physical Exam  Constitutional: He is oriented to person, place, and time. He appears well-developed and well-nourished. No distress.  Healthy-appearing. Well-groomed  HENT:  Head: Normocephalic and atraumatic.  Neck: No hepatojugular reflux and no JVD present. Carotid bruit is not present.  Cardiovascular: Normal rate, regular rhythm, normal heart sounds and intact distal pulses. Exam reveals no gallop and no friction rub.  No murmur heard. Pulmonary/Chest: Effort normal and breath sounds normal. No respiratory distress. He has no wheezes. He has no rales.  Abdominal: Soft. Bowel sounds are normal. He exhibits no distension. There is no tenderness.  Musculoskeletal: Normal range of motion. He exhibits no edema.  Neurological: He is alert and oriented to person, place, and time.  Skin: Skin is warm and dry. No erythema.  Psychiatric: He has a normal mood and affect. His behavior is normal. Judgment and thought content normal.  Nursing note and vitals reviewed.   Adult ECG Report n/a  Other studies Reviewed: Additional studies/ records that were reviewed today include:  Recent Labs:  No results found for: CHOL, HDL, LDLCALC, LDLDIRECT, TRIG, CHOLHDL - followed by PCP Lab Results  Component Value Date   CREATININE 0.90 09/12/2016   BUN 10 09/12/2016   NA 142 09/12/2016   K 3.9 09/12/2016   CL 108 09/12/2016   CO2 26 08/29/2013   ASSESSMENT / PLAN: Problem List Items Addressed This Visit    Obesity due to excess calories without serious comorbidity (Chronic)    Unfortunately, weight is now back stable in the 283-5 Lb pound  range after dropping to 278.  Continue to work on exercise and adjusting diet.      Exertional chest pain    Thankfully, he seems to be doing okay without any recurrent symptoms now.  He had a negative Myoview.  Overall, things seem to have improved since starting Lopressor and low-dose.      Essential hypertension (Chronic)    Stable on beta-blocker.      Dyspnea on exertion - Primary    Overall, he seems to be improved since I last saw him.  Remains on low-dose beta-blocker and diuretic.  Likely related to a component of diastolic dysfunction from hypertensive heart disease.  Echocardiogram only really suggests grade 1 diastolic dysfunction Briefly discussed sliding scale Lasix.  Current medicines are reviewed at length with the patient today. (+/- concerns) n/a The following changes have been made: n/a  Patient Instructions  No change to current medication or treatment.      Your physician wants you to follow-up in 37 month with DR Yacine Garriga. You will receive a reminder letter in the mail two months in advance. If you don't receive a letter, please call our office to schedule the follow-up appointment.    If you need a refill on your cardiac medications before your next appointment, please call your pharmacy.    Studies Ordered:   No orders of the defined types were placed in this encounter.    Glenetta Hew, M.D., M.S. Interventional Cardiologist   Pager # [phone] Phone # [phone] [address] [place], [place] [address]

## 2018-02-14 ENCOUNTER — Encounter: Payer: Self-pay | Admitting: Cardiology

## 2018-02-14 NOTE — Assessment & Plan Note (Signed)
Unfortunately, weight is now back stable in the 283-5 Lb pound range after dropping to 278.  Continue to work on exercise and adjusting diet.

## 2018-02-14 NOTE — Assessment & Plan Note (Signed)
Stable on beta blocker

## 2018-02-14 NOTE — Assessment & Plan Note (Signed)
Overall, he seems to be improved since I last saw him.  Remains on low-dose beta-blocker and diuretic.  Likely related to a component of diastolic dysfunction from hypertensive heart disease.  Echocardiogram only really suggests grade 1 diastolic dysfunction Briefly discussed sliding scale Lasix.

## 2018-02-14 NOTE — Assessment & Plan Note (Signed)
Thankfully, he seems to be doing okay without any recurrent symptoms now.  He had a negative Myoview.  Overall, things seem to have improved since starting Lopressor and low-dose.

## 2018-03-15 ENCOUNTER — Ambulatory Visit (INDEPENDENT_AMBULATORY_CARE_PROVIDER_SITE_OTHER): Payer: Medicare Other | Admitting: Pulmonary Disease

## 2018-03-15 ENCOUNTER — Encounter: Payer: Self-pay | Admitting: Pulmonary Disease

## 2018-03-15 VITALS — BP 108/78 | HR 59 | Ht 73.0 in | Wt 285.0 lb

## 2018-03-15 DIAGNOSIS — R0602 Shortness of breath: Secondary | ICD-10-CM

## 2018-03-15 DIAGNOSIS — R911 Solitary pulmonary nodule: Secondary | ICD-10-CM | POA: Diagnosis not present

## 2018-03-15 LAB — PULMONARY FUNCTION TEST
DL/VA % pred: 83 %
DL/VA: 3.98 ml/min/mmHg/L
DLCO COR % PRED: 67 %
DLCO COR: 24.57 ml/min/mmHg
DLCO UNC % PRED: 69 %
DLCO unc: 25.3 ml/min/mmHg
FEF 25-75 POST: 3.1 L/s
FEF 25-75 PRE: 2.72 L/s
FEF2575-%Change-Post: 14 %
FEF2575-%PRED-PRE: 126 %
FEF2575-%Pred-Post: 144 %
FEV1-%Change-Post: 3 %
FEV1-%Pred-Post: 94 %
FEV1-%Pred-Pre: 91 %
FEV1-POST: 2.99 L
FEV1-Pre: 2.89 L
FEV1FVC-%Change-Post: 0 %
FEV1FVC-%PRED-PRE: 109 %
FEV6-%Change-Post: 1 %
FEV6-%Pred-Post: 90 %
FEV6-%Pred-Pre: 89 %
FEV6-POST: 3.76 L
FEV6-Pre: 3.71 L
FEV6FVC-%CHANGE-POST: -2 %
FEV6FVC-%PRED-POST: 104 %
FEV6FVC-%Pred-Pre: 107 %
FVC-%Change-Post: 3 %
FVC-%PRED-PRE: 83 %
FVC-%Pred-Post: 86 %
FVC-PRE: 3.72 L
FVC-Post: 3.86 L
PRE FEV1/FVC RATIO: 78 %
Post FEV1/FVC ratio: 78 %
Post FEV6/FVC ratio: 97 %
Pre FEV6/FVC Ratio: 100 %
RV % pred: 113 %
RV: 3.25 L
TLC % PRED: 91 %
TLC: 7.02 L

## 2018-03-15 NOTE — Progress Notes (Signed)
Raymond Cuevas    [id number]    [birth year]  Primary Care Physician:Pharr, Thayer Jew, MD  Referring Physician: Deland Pretty, MD [place], [place] [address]  Chief complaint: Follow-up for dyspnea  HPI: 84 year old with history of GERD, allergic rhinitis.  Seen by primary care on 01/15/18 for cough, wheezing, diagnosed with asthmatic bronchitis and treated with azithromycin and steroids.  Chest x-ray some mild scarring at the base with no acute process. He states that his breathing is much improved today.  Denies any cough, sputum production, wheezing He has been outside doing yard work and mowing the lawn yesterday without any shortness of breath or respiratory issues  He had a CT angio in March 2019 which did not show any pulmonary embolism or acute lung abnormality.  There were 2 subcentimeter pulmonary nodules in the right lower lobe and left lower lobe  Pets: No pets.  He has a bird feeder in front yard and is exposed to wild birds Occupation: Retired Administrator Exposures: No dampness, mold , jacuzzi,, hot tub Smoking history: Never smoker Travel history: Grew up in [place].  No significant travel  Interim history: States that breathing is doing fine with no complaints.  Denies any cough, sputum production, fevers, chills.  Outpatient Encounter Medications as of 03/15/2018  Medication Sig  . calcium-vitamin D (OSCAL WITH D) 500-200 MG-UNIT per tablet Take 1 tablet by mouth.  . ferrous sulfate 325 (65 FE) MG tablet Take 1 tablet (325 mg total) by mouth 3 (three) times daily after meals.  . fish oil-omega-3 fatty acids 1000 MG capsule Take 1 g by mouth daily.  . furosemide (LASIX) 40 MG tablet Take 1 tablet (40 mg total) by mouth daily.  . magnesium hydroxide (MILK OF MAGNESIA) 400 MG/5ML suspension Take 30 mLs by mouth at bedtime.   . metoprolol tartrate (LOPRESSOR) 25 MG tablet Take 0.5 tablets (12.5 mg total) by mouth 2 (two) times daily.     No facility-administered encounter medications on file as of 03/15/2018.     Allergies as of 03/15/2018 - Review Complete 03/15/2018  Allergen Reaction Noted  . Codeine Other (See Comments) 04/05/2013  . Morphine and related Other (See Comments) 04/05/2013  . Sulfa antibiotics Rash 08/13/2013    Past Medical History:  Diagnosis Date  . Arthritis    osteoarthritis  . GERD (gastroesophageal reflux disease)    occ.  . 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 CATARACT EXTRACTION, BILATERAL Bilateral   . CHOLECYSTECTOMY     laparoscopic  . EYE SURGERY Left    hole repair  . JOINT REPLACEMENT Right    Knee  . REPLACEMENT TOTAL KNEE    . TONSILLECTOMY    . TOTAL HIP ARTHROPLASTY Right 08/27/2013   Procedure: RIGHT TOTAL HIP ARTHROPLASTY ANTERIOR APPROACH;  Surgeon: Mauri Pole, MD;  Location: [facility];  Service: Orthopedics;  Laterality: Right;    Family History  Problem Relation Age of Onset  . Sudden death Sister 56    Social History   Socioeconomic History  . Marital status: Widowed    Spouse name: Not on file  . Number of children: Not on file  . Years of education: Not on file  . Highest education level: Not on file  Occupational History  . Not on file  Social Needs  . Financial resource strain: Not on file  . Food insecurity:  Worry: Not on file    Inability: Not on file  . Transportation needs:    Medical: Not on file    Non-medical: Not on file  Tobacco Use  . Smoking status: Never Smoker  . Smokeless tobacco: Never Used  Substance and Sexual Activity  . Alcohol use: Yes    Alcohol/week: 0.6 oz    Types: 1 Cans of beer per week    Comment: rarely  . Drug use: No  . Sexual activity: Never    Birth control/protection: None  Lifestyle  . Physical activity:    Days per week: Not on file    Minutes per session: Not on file  . Stress: Not on file  Relationships  . Social connections:    Talks on  phone: Not on file    Gets together: Not on file    Attends religious service: Not on file    Active member of club or organization: Not on file    Attends meetings of clubs or organizations: Not on file    Relationship status: Not on file  . Intimate partner violence:    Fear of current or ex partner: Not on file    Emotionally abused: Not on file    Physically abused: Not on file    Forced sexual activity: Not on file  Other Topics Concern  . Not on file  Social History Narrative  . Not on file    Review of systems: Review of Systems  Constitutional: Negative for fever and chills.  HENT: Negative.   Eyes: Negative for blurred vision.  Respiratory: as per HPI  Cardiovascular: Negative for chest pain and palpitations.  Gastrointestinal: Negative for vomiting, diarrhea, blood per rectum. Genitourinary: Negative for dysuria, urgency, frequency and hematuria.  Musculoskeletal: Negative for myalgias, back pain and joint pain.  Skin: Negative for itching and rash.  Neurological: Negative for dizziness, tremors, focal weakness, seizures and loss of consciousness.  Endo/Heme/Allergies: Negative for environmental allergies.  Psychiatric/Behavioral: Negative for depression, suicidal ideas and hallucinations.  All other systems reviewed and are negative.  Physical Exam: Blood pressure 108/78, pulse (!) 59, height 6\' 1"  (1.854 m), weight 285 lb (129.3 kg), SpO2 95 %. Gen:      No acute distress HEENT:  EOMI, sclera anicteric Neck:     No masses; no thyromegaly Lungs:    Clear to auscultation bilaterally; normal respiratory effort CV:         Regular rate and rhythm; no murmurs Abd:      + bowel sounds; soft, non-tender; no palpable masses, no distension Ext:    No edema; adequate peripheral perfusion Skin:      Warm and dry; no rash Neuro: alert and oriented x 3 Psych: normal mood and affect  Data Reviewed: Chest x-ray 01/15/17 Mild scarring at the left base, no edema or  consolidation.  Stable cardiac silhouette.  CT chest 01/08/2018 No acute lung process.  Mild atelectasis at the bases.  4 mm right lower lobe, 5 mm left lower lobe nodule. Reviewed images personally.    FENO 02/09/2018-19   PFTs 03/15/2018 FVC 3.86 [86%], FEV1 2.99 [94%), F/F 78, TLC 91%, DLCO 69%, DLCO/VA 83% Obstruction or restriction.  Mild diffusion defect that corrects for alveolar volume  CBC 02/09/2018-WBC 5.9, eos 2.9%, absolute eosinophil count 200 Blood allergy profile/26/19-IgE 20, RAST panel shows mild allergies to tree pollen  Assessment:  Follow-up for dyspnea, wheezing Referred here after a recent episode of bronchitis with reactive airway disease He  appears to have responded well to the Z-Pak and steroid with no respiratory complaints today  PFTs reviewed with no obstruction.  Labs are normal.  There is no evidence that he has asthma No need for inhaler therapy as he is asymptomatic.  Subcentimeter pulmonary nodule Follow-up CT chest in 1 year  Plan/Recommendations: - CT chest in 1 year.  [name] [facility] 03/15/2018, 2:34 PM  CC: Deland Pretty, MD

## 2018-03-15 NOTE — Patient Instructions (Signed)
I reviewed your lung function test which looks okay I do not believe you will need regular inhalers I will follow-up with you in 1 year

## 2018-03-15 NOTE — Progress Notes (Signed)
PFT done today. 

## 2018-03-26 DIAGNOSIS — M25562 Pain in left knee: Secondary | ICD-10-CM | POA: Diagnosis not present

## 2018-03-29 DIAGNOSIS — M25562 Pain in left knee: Secondary | ICD-10-CM | POA: Diagnosis not present

## 2018-03-29 DIAGNOSIS — M1712 Unilateral primary osteoarthritis, left knee: Secondary | ICD-10-CM | POA: Diagnosis not present

## 2018-04-26 DIAGNOSIS — M25562 Pain in left knee: Secondary | ICD-10-CM | POA: Diagnosis not present

## 2018-06-07 DIAGNOSIS — Z125 Encounter for screening for malignant neoplasm of prostate: Secondary | ICD-10-CM | POA: Diagnosis not present

## 2018-06-07 DIAGNOSIS — Z7982 Long term (current) use of aspirin: Secondary | ICD-10-CM | POA: Diagnosis not present

## 2018-06-07 DIAGNOSIS — E781 Pure hyperglyceridemia: Secondary | ICD-10-CM | POA: Diagnosis not present

## 2018-06-12 DIAGNOSIS — I10 Essential (primary) hypertension: Secondary | ICD-10-CM | POA: Diagnosis not present

## 2018-06-12 DIAGNOSIS — R238 Other skin changes: Secondary | ICD-10-CM | POA: Diagnosis not present

## 2018-06-12 DIAGNOSIS — E781 Pure hyperglyceridemia: Secondary | ICD-10-CM | POA: Diagnosis not present

## 2018-06-12 DIAGNOSIS — N200 Calculus of kidney: Secondary | ICD-10-CM | POA: Diagnosis not present

## 2018-06-12 DIAGNOSIS — Z0000 Encounter for general adult medical examination without abnormal findings: Secondary | ICD-10-CM | POA: Diagnosis not present

## 2018-06-12 DIAGNOSIS — R911 Solitary pulmonary nodule: Secondary | ICD-10-CM | POA: Diagnosis not present

## 2018-06-12 DIAGNOSIS — L601 Onycholysis: Secondary | ICD-10-CM | POA: Diagnosis not present

## 2018-06-12 DIAGNOSIS — L570 Actinic keratosis: Secondary | ICD-10-CM | POA: Diagnosis not present

## 2018-06-12 DIAGNOSIS — K219 Gastro-esophageal reflux disease without esophagitis: Secondary | ICD-10-CM | POA: Diagnosis not present

## 2018-06-12 DIAGNOSIS — M1710 Unilateral primary osteoarthritis, unspecified knee: Secondary | ICD-10-CM | POA: Diagnosis not present

## 2018-06-12 DIAGNOSIS — Z6838 Body mass index (BMI) 38.0-38.9, adult: Secondary | ICD-10-CM | POA: Diagnosis not present

## 2018-06-14 DIAGNOSIS — R0989 Other specified symptoms and signs involving the circulatory and respiratory systems: Secondary | ICD-10-CM | POA: Diagnosis not present

## 2018-06-14 DIAGNOSIS — I739 Peripheral vascular disease, unspecified: Secondary | ICD-10-CM | POA: Diagnosis not present

## 2018-06-22 DIAGNOSIS — H26491 Other secondary cataract, right eye: Secondary | ICD-10-CM | POA: Diagnosis not present

## 2018-06-22 DIAGNOSIS — Z961 Presence of intraocular lens: Secondary | ICD-10-CM | POA: Diagnosis not present

## 2018-06-22 DIAGNOSIS — H40013 Open angle with borderline findings, low risk, bilateral: Secondary | ICD-10-CM | POA: Diagnosis not present

## 2018-06-22 DIAGNOSIS — H35361 Drusen (degenerative) of macula, right eye: Secondary | ICD-10-CM | POA: Diagnosis not present

## 2018-06-27 DIAGNOSIS — M25511 Pain in right shoulder: Secondary | ICD-10-CM | POA: Diagnosis not present

## 2018-06-27 DIAGNOSIS — M7541 Impingement syndrome of right shoulder: Secondary | ICD-10-CM | POA: Diagnosis not present

## 2018-06-27 DIAGNOSIS — M25562 Pain in left knee: Secondary | ICD-10-CM | POA: Diagnosis not present

## 2018-07-16 DIAGNOSIS — M25511 Pain in right shoulder: Secondary | ICD-10-CM | POA: Diagnosis not present

## 2018-07-20 DIAGNOSIS — Z23 Encounter for immunization: Secondary | ICD-10-CM | POA: Diagnosis not present

## 2018-09-06 DIAGNOSIS — I350 Nonrheumatic aortic (valve) stenosis: Secondary | ICD-10-CM | POA: Insufficient documentation

## 2018-09-06 HISTORY — DX: Nonrheumatic aortic (valve) stenosis: I35.0

## 2018-12-11 DIAGNOSIS — R972 Elevated prostate specific antigen [PSA]: Secondary | ICD-10-CM | POA: Diagnosis not present

## 2018-12-12 DIAGNOSIS — R0609 Other forms of dyspnea: Secondary | ICD-10-CM | POA: Diagnosis not present

## 2018-12-19 DIAGNOSIS — R0609 Other forms of dyspnea: Secondary | ICD-10-CM | POA: Diagnosis not present

## 2018-12-19 DIAGNOSIS — J4521 Mild intermittent asthma with (acute) exacerbation: Secondary | ICD-10-CM | POA: Diagnosis not present

## 2018-12-19 DIAGNOSIS — R9389 Abnormal findings on diagnostic imaging of other specified body structures: Secondary | ICD-10-CM | POA: Diagnosis not present

## 2019-01-17 ENCOUNTER — Telehealth: Payer: Self-pay | Admitting: *Deleted

## 2019-01-17 NOTE — Telephone Encounter (Signed)
   Primary Cardiologist:  DR Glenetta Hew   Patient contacted.  History reviewed.  No symptoms to suggest any unstable cardiac conditions.  Based on discussion, with current pandemic situation, we will be postponing this appointment for Raymond Cuevas with a plan for f/u in >12 wks or sooner if feasible/necessary.  If symptoms change, he has been instructed to contact our office.   Routing to C19 CANCEL pool for tracking    Ines Bloomer  01/17/2019 3:47 PM         .

## 2019-01-21 ENCOUNTER — Ambulatory Visit: Payer: Medicare Other | Admitting: Cardiology

## 2019-01-21 ENCOUNTER — Emergency Department (HOSPITAL_COMMUNITY)
Admission: EM | Admit: 2019-01-21 | Discharge: 2019-01-21 | Disposition: A | Discharge status: Home / Self Care | Payer: Medicare Other | Attending: Emergency Medicine | Admitting: Emergency Medicine

## 2019-01-21 ENCOUNTER — Other Ambulatory Visit: Payer: Self-pay

## 2019-01-21 ENCOUNTER — Encounter (HOSPITAL_COMMUNITY): Payer: Self-pay

## 2019-01-21 DIAGNOSIS — Z96651 Presence of right artificial knee joint: Secondary | ICD-10-CM | POA: Diagnosis not present

## 2019-01-21 DIAGNOSIS — R220 Localized swelling, mass and lump, head: Secondary | ICD-10-CM | POA: Diagnosis present

## 2019-01-21 DIAGNOSIS — Z96641 Presence of right artificial hip joint: Secondary | ICD-10-CM | POA: Insufficient documentation

## 2019-01-21 DIAGNOSIS — L739 Follicular disorder, unspecified: Secondary | ICD-10-CM | POA: Diagnosis not present

## 2019-01-21 DIAGNOSIS — L662 Folliculitis decalvans: Secondary | ICD-10-CM | POA: Diagnosis not present

## 2019-01-21 DIAGNOSIS — I10 Essential (primary) hypertension: Secondary | ICD-10-CM | POA: Insufficient documentation

## 2019-01-21 DIAGNOSIS — Z79899 Other long term (current) drug therapy: Secondary | ICD-10-CM | POA: Diagnosis not present

## 2019-01-21 MED ORDER — CEPHALEXIN 500 MG PO CAPS: 500.0000 mg | ORAL_CAPSULE | Freq: Two times a day (BID) | ORAL | 0 refills | Status: DC

## 2019-01-21 NOTE — ED Triage Notes (Addendum)
Patient c/o of bump on back of head x 3 weeks ago.  Bump has a white head.  4/10 pain   Ambulatory in triage.   A/Ox4

## 2019-01-21 NOTE — ED Provider Notes (Signed)
[facility] DEPT Provider Note   CSN: [id number] Arrival date & time: 01/21/19  1525    History   Chief Complaint Chief Complaint  Patient presents with  . bump on head    HPI Raymond Cuevas is a 85 y.o. male.     HPI Reports 1 month of a small bump on his posterior parietal scalp without drainage. No fevers or warmth. No other complaints. Pain is mild and worse with palpation   Past Medical History:  Diagnosis Date  . Arthritis    osteoarthritis  . GERD (gastroesophageal reflux disease)    occ.  . 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 Exertional chest pain 01/21/2017  . Dyspnea on exertion 01/21/2017  . Essential hypertension 01/21/2017  . Obesity due to excess calories without serious comorbidity 08/28/2013  . 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 CATARACT EXTRACTION, BILATERAL Bilateral   . CHOLECYSTECTOMY     laparoscopic  . EYE SURGERY Left    hole repair  . JOINT REPLACEMENT Right    Knee  . REPLACEMENT TOTAL KNEE    . TONSILLECTOMY    . TOTAL HIP ARTHROPLASTY Right 08/27/2013   Procedure: RIGHT TOTAL HIP ARTHROPLASTY ANTERIOR APPROACH;  Surgeon: Mauri Pole, MD;  Location: [facility];  Service: Orthopedics;  Laterality: Right;        Home Medications    Prior to Admission medications   Medication Sig Start Date End Date Taking? Authorizing Provider  calcium-vitamin D (OSCAL WITH D) 500-200 MG-UNIT per tablet Take 1 tablet by mouth.    [provider]  cephALEXin (KEFLEX) 500 MG capsule Take 1 capsule (500 mg total) by mouth 2 (two) times daily. 01/21/19   Jola Schmidt, MD  ferrous sulfate 325 (65 FE) MG tablet Take 1 tablet (325 mg total) by mouth 3 (three) times daily after meals. 08/29/13   Danae Orleans, PA-C  fish oil-omega-3 fatty acids 1000 MG capsule Take 1 g by mouth daily.    [provider]   furosemide (LASIX) 40 MG tablet Take 1 tablet (40 mg total) by mouth daily. 01/02/18   Leonie Man, MD  magnesium hydroxide (MILK OF MAGNESIA) 400 MG/5ML suspension Take 30 mLs by mouth at bedtime.     [provider]  metoprolol tartrate (LOPRESSOR) 25 MG tablet Take 0.5 tablets (12.5 mg total) by mouth 2 (two) times daily. 07/20/17 01/11/18  Leonie Man, MD    Family History Family History  Problem Relation Age of Onset  . Sudden death Sister 56    Social History Social History   Tobacco Use  . Smoking status: Never Smoker  . Smokeless tobacco: Never Used  Substance Use Topics  . Alcohol use: Yes    Alcohol/week: 1.0 standard drinks    Types: 1 Cans of beer per week    Comment: rarely  . Drug use: No     Allergies   Codeine; Morphine and related; and Sulfa antibiotics   Review of Systems Review of Systems  All other systems reviewed and are negative.    Physical Exam Updated Vital Signs BP 133/76 (BP Location: Left Arm)   Pulse 70   Temp 98.2 F (36.8 C) (Oral)   Resp 15   Ht 6\' 2"  (1.88 m)   Wt 120.2 kg   SpO2 96%   BMI 34.02 kg/m  Physical Exam Vitals signs and nursing note reviewed.  Constitutional:      Appearance: He is well-developed.  HENT:     Head: Normocephalic.     Comments: Small folliculitis of the posterior left parietal scalp without drainage or surrounding warmth Neck:     Musculoskeletal: Normal range of motion.  Pulmonary:     Effort: Pulmonary effort is normal.  Abdominal:     General: There is no distension.  Musculoskeletal: Normal range of motion.  Neurological:     Mental Status: He is alert and oriented to person, place, and time.      ED Treatments / Results  Labs (all labs ordered are listed, but only abnormal results are displayed) Labs Reviewed - No data to display  EKG None  Radiology No results found.  Procedures Procedures (including critical care time)  Medications Ordered in ED  Medications - No data to display   Initial Impression / Assessment and Plan / ED Course  I have reviewed the triage vital signs and the nursing notes.  Pertinent labs & imaging results that were available during my care of the patient were reviewed by me and considered in my medical decision making (see chart for details).        Small white head squeezed at the bedside and small amount of purulent material was expressed. Home with warm compresses and abx. Instructions to return to ER for new or worsening symptoms  Final Clinical Impressions(s) / ED Diagnoses   Final diagnoses:  Folliculitis    ED Discharge Orders         Ordered    cephALEXin (KEFLEX) 500 MG capsule  2 times daily     01/21/19 1543           Jola Schmidt, MD 01/21/19 1547

## 2019-01-21 NOTE — ED Notes (Signed)
Bed: [facility] Expected date:  Expected time:  Means of arrival:  Comments: 

## 2019-01-21 NOTE — Discharge Instructions (Addendum)
Apply warm compresses to the area

## 2019-02-08 NOTE — Telephone Encounter (Signed)
   Spoke w/ pt. Made appt w/ Dr Ellyn Hack for July 27th at 3:00 pm.  No other issues or concerns.   Rosaria Ferries, PA-C 02/08/2019 4:30 PM Beeper [phone]

## 2019-02-13 ENCOUNTER — Other Ambulatory Visit: Payer: Self-pay | Admitting: Internal Medicine

## 2019-02-13 DIAGNOSIS — R9389 Abnormal findings on diagnostic imaging of other specified body structures: Secondary | ICD-10-CM | POA: Diagnosis not present

## 2019-02-13 DIAGNOSIS — J45901 Unspecified asthma with (acute) exacerbation: Secondary | ICD-10-CM | POA: Diagnosis not present

## 2019-02-13 DIAGNOSIS — J9811 Atelectasis: Secondary | ICD-10-CM | POA: Diagnosis not present

## 2019-02-14 ENCOUNTER — Telehealth: Payer: Self-pay | Admitting: Pulmonary Disease

## 2019-02-14 ENCOUNTER — Other Ambulatory Visit: Payer: Self-pay | Admitting: Internal Medicine

## 2019-02-14 ENCOUNTER — Ambulatory Visit (INDEPENDENT_AMBULATORY_CARE_PROVIDER_SITE_OTHER): Payer: Medicare Other | Admitting: Nurse Practitioner

## 2019-02-14 ENCOUNTER — Other Ambulatory Visit: Payer: Self-pay

## 2019-02-14 DIAGNOSIS — R0609 Other forms of dyspnea: Secondary | ICD-10-CM

## 2019-02-14 DIAGNOSIS — R911 Solitary pulmonary nodule: Secondary | ICD-10-CM

## 2019-02-14 NOTE — Progress Notes (Signed)
Virtual Visit via Telephone Note  I connected with Raymond Cuevas on 02/15/19 at  3:30 PM EDT by telephone and verified that I am speaking with the correct person using two identifiers.  Location: Patient: Home  Provider: Office   I discussed the limitations, risks, security and privacy concerns of performing an evaluation and management service by telephone and the availability of in person appointments. I also discussed with the patient that there may be a patient responsible charge related to this service. The patient expressed understanding and agreed to proceed.   History of Present Illness: 85 year old male with lung nodule and history of bronchitis who is followed by Dr. Vaughan Browner.  Patient has office visit today for follow-up.  He was last seen by Dr. Vaughan Browner on 03/15/2018.  He states that this is been a stable interval for him.  He states that overall he is doing well.  He denies any significant cough.  He denies any recent fever.  She has scheduled to have his follow-up CT scan performed on May 11.  He states that he has been following with his PCP and has been referred to cardiology for a cardiac work-up.  He has appointment scheduled with them and this upcoming July. Denies f/c/s, n/v/d, hemoptysis, PND, leg swelling Denies chest pain or edema     Observations/Objective: Chest x-ray 01/15/17 Mild scarring at the left base, no edema or consolidation.  Stable cardiac silhouette.  CT chest 01/08/2018 No acute lung process.  Mild atelectasis at the bases.  4 mm right lower lobe, 5 mm left lower lobe nodule. Reviewed images personally.    FENO 02/09/2018-19   PFTs 03/15/2018 FVC 3.86 [86%], FEV1 2.99 [94%), F/F 78, TLC 91%, DLCO 69%, DLCO/VA 83% Obstruction or restriction.  Mild diffusion defect that corrects for alveolar volume  CBC 02/09/2018-WBC 5.9, eos 2.9%, absolute eosinophil count 200 Blood allergy profile/26/19-IgE 20, RAST panel shows mild allergies to tree  pollen   Assessment and Plan: Patient states that this is been a stable interval for him.  He is scheduled for an appointment with cardiology in July for full cardiac work-up.  He does have his follow-up CT scan already scheduled for May 11.  Patient Instructions  Follow-up CT scan scheduled for May 11 These keep follow-up with cardiology Continue current medications  Follow Up Instructions:  Follow-up with Dr. Vaughan Browner in 1 year or sooner if needed   I discussed the assessment and treatment plan with the patient. The patient was provided an opportunity to ask questions and all were answered. The patient agreed with the plan and demonstrated an understanding of the instructions.   The patient was advised to call back or seek an in-person evaluation if the symptoms worsen or if the condition fails to improve as anticipated.  I provided 23 minutes of non-face-to-face time during this encounter.   Fenton Foy, NP

## 2019-02-14 NOTE — Telephone Encounter (Signed)
Called and spoke with pt who stated he has had complaints of cough with large amounts of mucus production which pt has had x1 month. Pt stated that the mucus is white with a tint of green and a trace of blood.  Pt denies any fever, chest tightness, SOB, body aches or chills.  Pt has taken albuterol inhaler which he states he has had to use 4 times daily at times.   Stated to pt that we should schedule a televisit with NP to further address the cough and pt verbalized understanding. televisit has been scheduled for pt with TN today at 3:30. Nothing further needed.

## 2019-02-15 ENCOUNTER — Encounter: Payer: Self-pay | Admitting: Nurse Practitioner

## 2019-02-15 NOTE — Assessment & Plan Note (Signed)
Patient states that this is been a stable interval for him.  He is scheduled for an appointment with cardiology in July for full cardiac work-up.  He does have his follow-up CT scan already scheduled for May 11.  Patient Instructions  Follow-up CT scan scheduled for May 11 These keep follow-up with cardiology Continue current medications  Follow-up: Follow-up with Dr. Vaughan Browner in 1 year or sooner if needed

## 2019-02-15 NOTE — Patient Instructions (Signed)
Follow-up CT scan scheduled for May 11 These keep follow-up with cardiology Continue current medications  Follow-up: Follow-up with Dr. Vaughan Browner in 1 year or sooner if needed

## 2019-02-18 DIAGNOSIS — R9389 Abnormal findings on diagnostic imaging of other specified body structures: Secondary | ICD-10-CM | POA: Diagnosis not present

## 2019-02-25 ENCOUNTER — Other Ambulatory Visit: Payer: Self-pay

## 2019-02-25 ENCOUNTER — Ambulatory Visit
Admission: RE | Admit: 2019-02-25 | Discharge: 2019-02-25 | Disposition: A | Discharge status: Home / Self Care | Payer: Medicare Other | Source: Ambulatory Visit | Attending: Internal Medicine | Admitting: Internal Medicine

## 2019-02-25 DIAGNOSIS — R0602 Shortness of breath: Secondary | ICD-10-CM | POA: Diagnosis not present

## 2019-02-25 DIAGNOSIS — R911 Solitary pulmonary nodule: Secondary | ICD-10-CM

## 2019-05-02 ENCOUNTER — Telehealth: Payer: Self-pay | Admitting: *Deleted

## 2019-05-02 NOTE — Telephone Encounter (Signed)

## 2019-05-03 ENCOUNTER — Ambulatory Visit (INDEPENDENT_AMBULATORY_CARE_PROVIDER_SITE_OTHER)
Admission: RE | Admit: 2019-05-03 | Discharge: 2019-05-03 | Disposition: A | Discharge status: Home / Self Care | Payer: Medicare Other | Source: Ambulatory Visit | Attending: Pulmonary Disease | Admitting: Pulmonary Disease

## 2019-05-03 ENCOUNTER — Other Ambulatory Visit: Payer: Self-pay

## 2019-05-03 DIAGNOSIS — R918 Other nonspecific abnormal finding of lung field: Secondary | ICD-10-CM | POA: Diagnosis not present

## 2019-05-03 DIAGNOSIS — R911 Solitary pulmonary nodule: Secondary | ICD-10-CM | POA: Diagnosis not present

## 2019-05-10 ENCOUNTER — Telehealth: Payer: Self-pay | Admitting: *Deleted

## 2019-05-10 NOTE — Telephone Encounter (Signed)
    COVID-19 Pre-Screening Questions:  . In the past 7 to 10 days have you had a cough,  shortness of breath, headache, congestion, fever (100 or greater) body aches, chills, sore throat, or sudden loss of taste or sense of smell?NO . Have you been around anyone with known Covid 19.NO . Have you been around anyone who is awaiting Covid 19 test results in the past 7 to 10 days?NO Have you been around anyone who has been exposed to Covid 19, or has mentioned symptoms of Covid 19 within the past 7 to 10 days?NO    Primary Cardiologist: Glenetta Hew, MD   Pt contacted.  History and symptoms reviewed.  Pt will f[facility]u with [facility] provider as scheduled.  Pt. advised that we are restricting visitors at this time and request that only patients present for check-in prior to their appointment.  All other visitors should remain in their car.  If necessary, only one visitor may come with the patient, into the building. For everyone's safety, all patients and visitor entering our practice area should expect to be screened again prior to entering our waiting area. WEAR FACE COVERING OR MASK  Raiford Simmonds, RN  05/10/2019 6:15 PM

## 2019-05-13 ENCOUNTER — Other Ambulatory Visit: Payer: Self-pay

## 2019-05-13 ENCOUNTER — Ambulatory Visit (INDEPENDENT_AMBULATORY_CARE_PROVIDER_SITE_OTHER): Payer: Medicare Other | Admitting: Cardiology

## 2019-05-13 ENCOUNTER — Encounter: Payer: Self-pay | Admitting: Cardiology

## 2019-05-13 VITALS — BP 118/70 | HR 57 | Temp 97.7°F | Ht 74.0 in | Wt 291.4 lb

## 2019-05-13 DIAGNOSIS — I10 Essential (primary) hypertension: Secondary | ICD-10-CM

## 2019-05-13 DIAGNOSIS — R0609 Other forms of dyspnea: Secondary | ICD-10-CM | POA: Diagnosis not present

## 2019-05-13 DIAGNOSIS — Z6841 Body Mass Index (BMI) 40.0 and over, adult: Secondary | ICD-10-CM | POA: Diagnosis not present

## 2019-05-13 NOTE — Progress Notes (Signed)
PCP: Deland Pretty, MD  Clinic Note: Chief Complaint  Patient presents with  . Follow-up    OD 12 month f/u ls 01/2018 c/o sob. Meds reviewed verbally with pt.    HPI: Raymond Cuevas is a 85 y.o. male with no real significant medical history initially seen in April 2018 for exertional dyspnea and chest pain at the request of Deland Pretty, MD. --Initial evaluation was with a Myoview stress test in February 2018 showing normal EF (55 to 65%).  No ischemia or infarction.  LOW RISK. --As of October 2018 was doing well walking 3 miles a day without issues just noticing exertional dyspnea uphill.  Raymond Cuevas was last seen in April 2019 in follow-up from a visit with Rosaria Ferries, PA Raymond Cuevas was noticing some weight gain and reduced energy.  2D echocardiogram ordered, reviewed and PSH below.  Essentially normal. --> Continue to discuss Lasix with sliding scale for some diastolic dysfunction.  But otherwise no real cardiac etiology for dyspnea.  Just obesity and deconditioning.  Recent Hospitalizations: ER visit in April for folliculitis otherwise none  Studies Personally Reviewed - (if available, images/films reviewed: From Epic Chart or Care Everywhere)  None since last visit  Interval History: Raymond Cuevas presents today.  Somewhat stoic.  Raymond Cuevas says that Raymond Cuevas just feels Raymond Cuevas gets tired very easily.  But Raymond Cuevas also tells me is able to walk 2-1/2 miles on flat surface without any difficulty.  Raymond Cuevas usually likes to walk at the [facility] because his air conditioned and flat.  Raymond Cuevas does fine with this but no real dyspnea or chest tightness or pressure.  However, if Raymond Cuevas tries do any stairs or walks up an incline Raymond Cuevas will get short of breath and have to stop.  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  Raymond Cuevas says Raymond Cuevas will get short of breath if Raymond Cuevas does things like vacuuming or other cleaning activities which is a little bit  new for him.  Still denies any chest pain. Raymond Cuevas has been getting more short of breath lately, but not with his routine walking unless Raymond Cuevas goes uphill.  No resting dyspnea as far as I can tell.  Raymond Cuevas has recently seen his pulmonologist, but is not sure what the plan is.  No chest pain with rest or exertion.  No PND, or orthopnea with trivial edema.  No palpitations, lightheadedness, dizziness, weakness or syncope/near syncope. No TIA/amaurosis fugax symptoms.  No claudication.  ROS: A comprehensive was performed. Review of Systems  Constitutional: Positive for malaise/fatigue (Really only with walking uphill). Negative for weight loss (Has gained quite a bit of weight in the last 6 months).  HENT: Positive for hearing loss. Negative for congestion and nosebleeds.   Respiratory: Positive for cough (Sometimes in the morning), shortness of breath (Mostly walking uphill, doing vacuuming.  See HPI.) and wheezing. Negative for sputum production.   Cardiovascular: Positive for leg swelling (Off and on.).  Gastrointestinal: Negative for abdominal pain, blood in stool, heartburn, melena, nausea and vomiting.  Genitourinary: Negative for hematuria.  Musculoskeletal: Positive for joint pain.  Neurological: Negative for dizziness, focal weakness, seizures, loss of consciousness, weakness and headaches.  Psychiatric/Behavioral: Negative.   All other systems reviewed and are negative.   The patient does not have symptoms concerning for COVID-19 infection (fever, chills, cough, or new shortness of breath).  The patient is practicing  social distancing.   COVID-19 Education: The signs and symptoms of COVID-19 were discussed with the patient and how to seek care for testing (follow up with PCP or arrange E-visit).   The importance of social distancing was discussed today.   I have reviewed and (if needed) personally updated the patient's problem list, medications, allergies, past medical and surgical  history, social and family history.   Past Medical History:  Diagnosis Date  . Arthritis    osteoarthritis  . GERD (gastroesophageal reflux disease)    occ.  . 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 CATARACT EXTRACTION, BILATERAL Bilateral   . CHOLECYSTECTOMY     laparoscopic  . EYE SURGERY Left    hole repair  . JOINT REPLACEMENT Right    Knee  . NM MYOVIEW LTD  11/2016    normal EF (55 to 65%).  No ischemia or infarction.  LOW RISK.  Marland Kitchen REPLACEMENT TOTAL KNEE    . TONSILLECTOMY    . TOTAL HIP ARTHROPLASTY Right 08/27/2013   Procedure: RIGHT TOTAL HIP ARTHROPLASTY ANTERIOR APPROACH;  Surgeon: Mauri Pole, MD;  Location: [facility];  Service: Orthopedics;  Laterality: Right;  . TRANSTHORACIC ECHOCARDIOGRAM  12/2017   Mild LVH.  Normal EF 55-60%.  GR 1 DD.  No RWMA.  Mild AS (mean gradient 21 mm)    Current Meds  Medication Sig  . albuterol (VENTOLIN HFA) 108 (90 Base) MCG/ACT inhaler INHALE 2 PUFFS BY MOUTH AS NEEDED 4 TIMES DAILY UNTIL WHEEZING EXTINGUISHED. RINSE AND SPIT AFTER USE. DEMONSTRATE PROPER TECHNIQUE INHALATI  . calcium-vitamin D (OSCAL WITH D) 500-200 MG-UNIT per tablet Take 1 tablet by mouth.  . cephALEXin (KEFLEX) 500 MG capsule Take 1 capsule (500 mg total) by mouth 2 (two) times daily.  . ferrous sulfate 325 (65 FE) MG tablet Take 1 tablet (325 mg total) by mouth 3 (three) times daily after meals.  . fish oil-omega-3 fatty acids 1000 MG capsule Take 1 g by mouth daily.  . furosemide (LASIX) 40 MG tablet Take 1 tablet (40 mg total) by mouth daily.  . magnesium hydroxide (MILK OF MAGNESIA) 400 MG/5ML suspension Take 30 mLs by mouth at bedtime.   . metoprolol tartrate (LOPRESSOR) 25 MG tablet Take 0.5 tablets (12.5 mg total) by mouth 2 (two) times daily.    Allergies  Allergen Reactions  . Codeine Other (See Comments)    "Walking the walls"  . Morphine And Related Other (See Comments)    "Walking the walls"  .  Sulfa Antibiotics Rash    Social History   Tobacco Use  . Smoking status: Never Smoker  . Smokeless tobacco: Never Used  Substance Use Topics  . Alcohol use: Yes    Alcohol/week: 1.0 standard drinks    Types: 1 Cans of beer per week    Comment: rarely  . Drug use: No   Social History   Social History Narrative  . Not on file    family history includes Sudden death (age of onset: 19) in his sister.  Wt Readings from Last 3 Encounters:  05/13/19 291 lb 6 oz (132.2 kg)  01/21/19 265 lb (120.2 kg)  03/15/18 285 lb (129.3 kg)    PHYSICAL EXAM BP 118/70 (BP Location: Left Arm, Patient Position: Sitting, Cuff Size: Large)   Pulse (!) 57   Temp 97.7 F (36.5 C)   Ht 6\' 2"  (1.88 m)   Wt 291 lb 6 oz (  132.2 kg)   SpO2 98%   BMI 37.41 kg/m  Physical Exam  Constitutional: Raymond Cuevas is oriented to person, place, and time. Raymond Cuevas appears well-developed and well-nourished. No distress.  Essentially morbidly obese.  Well-groomed.  HENT:  Head: Normocephalic.  Very hard of hearing  Neck: Normal range of motion. Neck supple. No hepatojugular reflux and no JVD present. Carotid bruit is not present.  Cardiovascular: Regular rhythm, S1 normal, S2 normal and intact distal pulses.  Occasional extrasystoles are present. Bradycardia present. PMI is not displaced (Unable to palpate). Exam reveals distant heart sounds and decreased pulses (Mildly decreased pedal pulses.). Exam reveals no gallop and no friction rub.  Murmur (Soft 1/6 SEM at RUSB.) heard. Pulmonary/Chest: Effort normal. No respiratory distress. Raymond Cuevas has no rales. Raymond Cuevas exhibits no tenderness.  Diminished breath sounds bilaterally, with some interstitial sounds and expiratory wheeze.  No dullness to percussion.  Abdominal: Soft. Bowel sounds are normal. Raymond Cuevas exhibits no distension. There is no abdominal tenderness. There is no rebound.  Obese.  No HSM (unable to palpate HSM)  Musculoskeletal:        General: Edema (1+ bilateral pedal/pretibial)  present.  Neurological: Raymond Cuevas is alert and oriented to person, place, and time.  Skin: Skin is warm and dry.  Psychiatric: Raymond Cuevas has a normal mood and affect. His behavior is normal. Judgment and thought content normal.  Somewhat slow response to questions, I am not sure if this is because of hard of hearing or difficulty understanding.  Vitals reviewed.    Adult ECG Report  Rate: 57 ;  Rhythm: sinus bradycardia and Otherwise really normal axis, intervals and durations.;   Narrative Interpretation: Essentially normal EKG.  Stable   Other studies Reviewed: Additional studies/ records that were reviewed today include:  Recent Labs:  No results found for: CHOL, HDL, LDLCALC, LDLDIRECT, TRIG, CHOLHDL -Labs not available   ASSESSMENT / PLAN: Problem List Items Addressed This Visit    Obesity due to excess calories without serious comorbidity (Chronic)    Again Raymond Cuevas is gained back up above 290 pounds.  Every time Raymond Cuevas thinks Raymond Cuevas is lost weight, Raymond Cuevas regains it.  I think that is probably why Raymond Cuevas is more short of breath now.  Needs to continue to cut down his dietary intake, improve his intake and continue to stay active.      Relevant Orders   EKG 12-Lead (Completed)   Essential hypertension (Chronic)    Blood pressure-looks pretty good.  I think some of his dyspnea/fatigue may be related to chronotropic incompetence.  With his COPD issues, I think the best thing is for him to wean off of the metoprolol altogether.  We will have him wean off over the course of the next 2 weeks to see if this helps. We will need to reevaluate pressures and potentially consider adding ARB or spironolactone. -->  Would not do amlodipine because of edema issues.  However will defer to PCP      Relevant Orders   EKG 12-Lead (Completed)   Dyspnea on exertion - Primary    So far Raymond Cuevas has had a Myoview this been negative for ischemia with normal function.  Echocardiogram shows normal function with maybe grade 1 diastolic  dysfunction was in an 85 year old is simply normal physiology.  I do not think that explains his exertional dyspnea going uphill.  I think a lot of it is related to his weight and deconditioning.  At this point I probably would not do any more ischemic evaluation  unless Raymond Cuevas would actually have anginal symptoms.      Relevant Orders   EKG 12-Lead (Completed)      I spent a total of 25 minutes with the patient and chart review. >  50% of the time was spent in direct patient consultation.   Current medicines are reviewed at length with the patient today.  (+/- concerns) still feels tired.   Patient Instructions  Medication Instructions:    Wean  Metoprolol  12.5 mg  One tablet daily  For 2 weeks then stop .  If you need a refill on your cardiac medications before your next appointment, please call your pharmacy.   Lab work: Not needed   Testing/Procedures: Not needed  Follow-Up: At [facility], you and your health needs are our priority.  As part of our continuing mission to provide you with exceptional heart care, we have created designated Provider Care Teams.  These Care Teams include your primary Cardiologist (physician) and Advanced Practice Providers (APPs -  Physician Assistants and Nurse Practitioners) who all work together to provide you with the care you need, when you need it. . You will need a follow up appointment in 3 to 4  months.  Please call our office 2 months in advance to schedule this appointment.  You may  one of the following Advanced Practice Providers on your designated Care Team:   . Rosaria Ferries, PA-C . Jory Sims, DNP, ANP  Any Other Special Instructions Will Be Listed Below (If Applicable).    Studies Ordered:   Orders Placed This Encounter  Procedures  . EKG 12-Lead      Glenetta Hew, M.D., M.S. Interventional Cardiologist   Pager # [phone] Phone # [phone] [address]. [place], [place] [address]   Thank  you for choosing [facility] at [facility]!!

## 2019-05-13 NOTE — Patient Instructions (Addendum)
Medication Instructions:    Wean  Metoprolol  12.5 mg  One tablet daily  For 2 weeks then stop .  If you need a refill on your cardiac medications before your next appointment, please call your pharmacy.   Lab work: Not needed   Testing/Procedures: Not needed  Follow-Up: At [facility], you and your health needs are our priority.  As part of our continuing mission to provide you with exceptional heart care, we have created designated Provider Care Teams.  These Care Teams include your primary Cardiologist (physician) and Advanced Practice Providers (APPs -  Physician Assistants and Nurse Practitioners) who all work together to provide you with the care you need, when you need it. . You will need a follow up appointment in 3 to 4  months.  Please call our office 2 months in advance to schedule this appointment.  You may  one of the following Advanced Practice Providers on your designated Care Team:   . Rosaria Ferries, PA-C . Jory Sims, DNP, ANP  Any Other Special Instructions Will Be Listed Below (If Applicable).

## 2019-05-14 ENCOUNTER — Encounter: Payer: Self-pay | Admitting: Cardiology

## 2019-05-14 NOTE — Assessment & Plan Note (Signed)
So far he has had a Myoview this been negative for ischemia with normal function.  Echocardiogram shows normal function with maybe grade 1 diastolic dysfunction was in an 85 year old is simply normal physiology.  I do not think that explains his exertional dyspnea going uphill.  I think a lot of it is related to his weight and deconditioning.  At this point I probably would not do any more ischemic evaluation unless he would actually have anginal symptoms.

## 2019-05-14 NOTE — Assessment & Plan Note (Signed)
Again he is gained back up above 290 pounds.  Every time he thinks he is lost weight, he regains it.  I think that is probably why he is more short of breath now.  Needs to continue to cut down his dietary intake, improve his intake and continue to stay active.

## 2019-05-14 NOTE — Assessment & Plan Note (Addendum)
Blood pressure-looks pretty good.  I think some of his dyspnea/fatigue may be related to chronotropic incompetence.  With his COPD issues, I think the best thing is for him to wean off of the metoprolol altogether.  We will have him wean off over the course of the next 2 weeks to see if this helps. We will need to reevaluate pressures and potentially consider adding ARB or spironolactone. -->  Would not do amlodipine because of edema issues.  However will defer to PCP

## 2019-06-13 DIAGNOSIS — E781 Pure hyperglyceridemia: Secondary | ICD-10-CM | POA: Diagnosis not present

## 2019-06-13 DIAGNOSIS — Z125 Encounter for screening for malignant neoplasm of prostate: Secondary | ICD-10-CM | POA: Diagnosis not present

## 2019-06-25 DIAGNOSIS — Z961 Presence of intraocular lens: Secondary | ICD-10-CM | POA: Diagnosis not present

## 2019-06-25 DIAGNOSIS — H40013 Open angle with borderline findings, low risk, bilateral: Secondary | ICD-10-CM | POA: Diagnosis not present

## 2019-06-25 DIAGNOSIS — H04123 Dry eye syndrome of bilateral lacrimal glands: Secondary | ICD-10-CM | POA: Diagnosis not present

## 2019-06-25 DIAGNOSIS — H35361 Drusen (degenerative) of macula, right eye: Secondary | ICD-10-CM | POA: Diagnosis not present

## 2019-06-27 DIAGNOSIS — R0989 Other specified symptoms and signs involving the circulatory and respiratory systems: Secondary | ICD-10-CM | POA: Diagnosis not present

## 2019-06-27 DIAGNOSIS — I10 Essential (primary) hypertension: Secondary | ICD-10-CM | POA: Diagnosis not present

## 2019-06-27 DIAGNOSIS — Z23 Encounter for immunization: Secondary | ICD-10-CM | POA: Diagnosis not present

## 2019-06-27 DIAGNOSIS — H9193 Unspecified hearing loss, bilateral: Secondary | ICD-10-CM | POA: Diagnosis not present

## 2019-06-27 DIAGNOSIS — R918 Other nonspecific abnormal finding of lung field: Secondary | ICD-10-CM | POA: Diagnosis not present

## 2019-06-27 DIAGNOSIS — Z0001 Encounter for general adult medical examination with abnormal findings: Secondary | ICD-10-CM | POA: Diagnosis not present

## 2019-06-27 DIAGNOSIS — I2510 Atherosclerotic heart disease of native coronary artery without angina pectoris: Secondary | ICD-10-CM | POA: Diagnosis not present

## 2019-06-27 DIAGNOSIS — E781 Pure hyperglyceridemia: Secondary | ICD-10-CM | POA: Diagnosis not present

## 2019-06-27 DIAGNOSIS — I2584 Coronary atherosclerosis due to calcified coronary lesion: Secondary | ICD-10-CM | POA: Diagnosis not present

## 2019-06-27 DIAGNOSIS — J309 Allergic rhinitis, unspecified: Secondary | ICD-10-CM | POA: Diagnosis not present

## 2019-06-27 DIAGNOSIS — M25511 Pain in right shoulder: Secondary | ICD-10-CM | POA: Diagnosis not present

## 2019-06-27 DIAGNOSIS — Z7982 Long term (current) use of aspirin: Secondary | ICD-10-CM | POA: Diagnosis not present

## 2019-06-27 DIAGNOSIS — K219 Gastro-esophageal reflux disease without esophagitis: Secondary | ICD-10-CM | POA: Diagnosis not present

## 2019-07-04 DIAGNOSIS — I10 Essential (primary) hypertension: Secondary | ICD-10-CM | POA: Diagnosis not present

## 2019-07-04 DIAGNOSIS — E782 Mixed hyperlipidemia: Secondary | ICD-10-CM | POA: Diagnosis not present

## 2019-07-08 DIAGNOSIS — M25811 Other specified joint disorders, right shoulder: Secondary | ICD-10-CM | POA: Diagnosis not present

## 2019-07-08 DIAGNOSIS — M25511 Pain in right shoulder: Secondary | ICD-10-CM | POA: Diagnosis not present

## 2019-07-12 ENCOUNTER — Telehealth: Payer: Self-pay | Admitting: Cardiology

## 2019-07-12 NOTE — Telephone Encounter (Signed)
Lab results from PCP clinic visit 19 2020 Recent Labs: 06/13/2019  Na+ 144, K+ 3.9, Cl- 107, HCO3- 28 , BUN 14, Cr 1.2, Glu 100, Ca2+ 9.2; AST 26, ALT 39, AlkP 44,  T Bili 0.6, TP 6.6,Alb 3.4  CBC: W 6.4, H/H 15.4/45.5, Plt 321  TC 189, TG 268, HDL 38, LDL 97   Glenetta Hew, MD

## 2019-08-05 DIAGNOSIS — I10 Essential (primary) hypertension: Secondary | ICD-10-CM | POA: Diagnosis not present

## 2019-09-10 ENCOUNTER — Ambulatory Visit (INDEPENDENT_AMBULATORY_CARE_PROVIDER_SITE_OTHER): Payer: Medicare Other | Admitting: Cardiology

## 2019-09-10 ENCOUNTER — Other Ambulatory Visit: Payer: Self-pay

## 2019-09-10 ENCOUNTER — Encounter: Payer: Self-pay | Admitting: Cardiology

## 2019-09-10 VITALS — BP 126/62 | HR 73 | Temp 97.3°F | Ht 74.0 in | Wt 295.0 lb

## 2019-09-10 DIAGNOSIS — R0609 Other forms of dyspnea: Secondary | ICD-10-CM

## 2019-09-10 DIAGNOSIS — R0600 Dyspnea, unspecified: Secondary | ICD-10-CM

## 2019-09-10 DIAGNOSIS — I350 Nonrheumatic aortic (valve) stenosis: Secondary | ICD-10-CM

## 2019-09-10 DIAGNOSIS — I10 Essential (primary) hypertension: Secondary | ICD-10-CM

## 2019-09-10 MED ORDER — LOSARTAN POTASSIUM 25 MG PO TABS: 25.0000 mg | ORAL_TABLET | Freq: Every day | ORAL | 3 refills | Status: DC

## 2019-09-10 NOTE — Patient Instructions (Signed)
.  Medication Instructions:     TAKE UNTIL THE BOTTLE IS EMPTY - 1/2 TABLET OF METOPROLOL A DAY  THENS TOP TAKING.    IN 2 WEEKS START TAKING LOSARTAN  25 MG  - ONE TABLET DAILY   CONTINUE ALL OTHER MEDICATIONS  *If you need a refill on your cardiac medications before your next appointment, please call your pharmacy*  Lab Work: NOT  NEEDED   Testing/Procedures: WILL BE SCHEDULE IN MARCH 2021 -- [place] [address] Your physician has requested that you have an echocardiogram. Echocardiography is a painless test that uses sound waves to create images of your heart. It provides your doctor with information about the size and shape of your heart and how well your heart's chambers and valves are working. This procedure takes approximately one hour. There are no restrictions for this procedure.    Follow-Up: At [facility], you and your health needs are our priority.  As part of our continuing mission to provide you with exceptional heart care, we have created designated Provider Care Teams.  These Care Teams include your primary Cardiologist (physician) and Advanced Practice Providers (APPs -  Physician Assistants and Nurse Practitioners) who all work together to provide you with the care you need, when you need it.  Your next appointment:   6 month(s)- MAY 2021  The format for your next appointment:   In Person  Provider:   Glenetta Hew, MD  Other Instructions

## 2019-09-10 NOTE — Progress Notes (Signed)
Primary Care Provider: Deland Pretty, MD Cardiologist: Glenetta Hew, MD Electrophysiologist:   Clinic Note: Chief Complaint  Patient presents with  . Follow-up    HPI:    Raymond Cuevas is a 85 y.o. male who is being seen today for 64-month follow-up of exertional dyspnea.  Raymond Cuevas was initially referred by Dr.Pharr for evaluation of chest pain and exertional dyspnea-seen in April 2018.  He is morbidly obese with hypertension, hyperlipidemia, and COPD. --Initial evaluation was with a Myoview stress test in February 2018 showing normal EF (55 to 65%).  No ischemia or infarction.  LOW RISK. --As of October 2018 was doing well walking 3 miles a day without issues just noticing exertional dyspnea uphill.  Raymond Cuevas was last seen on in July 2020 as a hospital follow-up after folliculitis.  At that time he noted getting tired easily and getting short of breath.  He says if he walks on flat surface about 2 to 20 miles he has no problems, however if he walks up a hill or upstairs, he does get short of breath.  No chest tightness or pressure.  He also does not like to do things in extreme heat or cold. --Plan was for him to wean off of his Lopressor to evaluate for possible chronotropic incompetence as the reason for his dyspnea.  Recent Hospitalizations: None  Reviewed  CV studies:    The following studies were reviewed today: (if available, images/films reviewed: From Epic Chart or Care Everywhere) . none:   Interval History:   Raymond Cuevas returns today essentially feeling the same.  He never did wean off the beta-blocker.  He has a, mid axillary left upper chest/breast area discomfort that feels like a pulling sensation off and on.  Not associated with any particular activity.  Otherwise he has no real chest pain or pressure.  He still indicates that he is able to walk on a flat surface at least 2 to 2.5 miles without any issues.  However if he starts going up an  incline, or up steps he will get short of breath which is his baseline.  His swelling is stable on current dose of Lasix.  CV Review of Symptoms (Summary) positive for - dyspnea on exertion and Stable swelling. negative for - chest pain, irregular heartbeat, orthopnea, palpitations, paroxysmal nocturnal dyspnea, rapid heart rate, shortness of breath or Syncope/near syncope or TIA/amaurosis fugax; claudication  The patient does not have symptoms concerning for COVID-19 infection (fever, chills, cough, or new shortness of breath).  The patient is practicing social distancing. ++ Masking.  He does go out occasionally for groceries/shopping, otherwise he and his wife stay at home.  Avoiding contact with other people.   REVIEWED OF SYSTEMS   A comprehensive ROS was performed. Review of Systems  Constitutional: Positive for malaise/fatigue. Negative for weight loss.  HENT: Negative for congestion and nosebleeds.   Respiratory: Positive for cough (Occasional morning cough) and shortness of breath (Exertional dyspnea).   Cardiovascular: Positive for leg swelling (Stable).  Gastrointestinal: Negative for blood in stool and melena.  Genitourinary: Negative for hematuria.  Musculoskeletal: Positive for joint pain.  Neurological: Negative for dizziness, focal weakness and headaches.  Psychiatric/Behavioral: Negative for memory loss. The patient is not nervous/anxious and does not have insomnia.   All other systems reviewed and are negative.   I have reviewed and (if needed) personally updated the patient's problem list, medications, allergies, past medical and surgical history, social and family history.  PAST MEDICAL HISTORY   Past Medical History:  Diagnosis Date  . Arthritis    osteoarthritis  . GERD (gastroesophageal reflux disease)    occ.  . 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 CATARACT EXTRACTION, BILATERAL  Bilateral   . CHOLECYSTECTOMY     laparoscopic  . EYE SURGERY Left    hole repair  . JOINT REPLACEMENT Right    Knee  . NM MYOVIEW LTD  11/2016    normal EF (55 to 65%).  No ischemia or infarction.  LOW RISK.  Marland Kitchen REPLACEMENT TOTAL KNEE    . TONSILLECTOMY    . TOTAL HIP ARTHROPLASTY Right 08/27/2013   Procedure: RIGHT TOTAL HIP ARTHROPLASTY ANTERIOR APPROACH;  Surgeon: Mauri Pole, MD;  Location: [facility];  Service: Orthopedics;  Laterality: Right;  . TRANSTHORACIC ECHOCARDIOGRAM  12/2017   Mild LVH.  Normal EF 55-60%.  GR 1 DD.  No RWMA.  Mild AS (mean gradient 21 mm)    MEDICATIONS/ALLERGIES   Current Meds  Medication Sig  . albuterol (VENTOLIN HFA) 108 (90 Base) MCG/ACT inhaler INHALE 2 PUFFS BY MOUTH AS NEEDED 4 TIMES DAILY UNTIL WHEEZING EXTINGUISHED. RINSE AND SPIT AFTER USE. DEMONSTRATE PROPER TECHNIQUE INHALATI  . calcium-vitamin D (OSCAL WITH D) 500-200 MG-UNIT per tablet Take 1 tablet by mouth.  . cephALEXin (KEFLEX) 500 MG capsule Take 1 capsule (500 mg total) by mouth 2 (two) times daily.  . ferrous sulfate 325 (65 FE) MG tablet Take 1 tablet (325 mg total) by mouth 3 (three) times daily after meals.  . fish oil-omega-3 fatty acids 1000 MG capsule Take 1 g by mouth daily.  . furosemide (LASIX) 40 MG tablet Take 1 tablet (40 mg total) by mouth daily.  . magnesium hydroxide (MILK OF MAGNESIA) 400 MG/5ML suspension Take 30 mLs by mouth at bedtime.     Allergies  Allergen Reactions  . Codeine Other (See Comments)    "Walking the walls"  . Morphine And Related Other (See Comments)    "Walking the walls"  . Sulfa Antibiotics Rash    SOCIAL HISTORY/FAMILY HISTORY   Social History   Tobacco Use  . Smoking status: Never Smoker  . Smokeless tobacco: Never Used  Substance Use Topics  . Alcohol use: Yes    Alcohol/week: 1.0 standard drinks    Types: 1 Cans of beer per week    Comment: rarely  . Drug use: No   Social History   Social History Narrative  . Not on  file    Family History family history includes Sudden death (age of onset: 74) in his sister.   OBJCTIVE -PE, EKG, labs   Wt Readings from Last 3 Encounters:  09/10/19 295 lb (133.8 kg)  05/13/19 291 lb 6 oz (132.2 kg)  01/21/19 265 lb (120.2 kg)    Physical Exam: BP 126/62 (BP Location: Left Arm, Patient Position: Sitting, Cuff Size: Large)   Pulse 73   Temp (!) 97.3 F (36.3 C)   Ht 6\' 2"  (1.88 m)   Wt 295 lb (133.8 kg)   BMI 37.88 kg/m  Physical Exam  Constitutional: He is oriented to person, place, and time. He appears well-developed and well-nourished. No distress.  HENT:  Head: Normocephalic and atraumatic.  Neck: Normal range of motion. Neck supple. No JVD present.  Cardiovascular: Normal rate, regular rhythm, S1 normal and S2 normal.  Occasional extrasystoles are present. PMI is not  displaced (Unable to assess). Exam reveals distant heart sounds and decreased pulses (Palpable but diminished pedal pulses due to body habitus). Exam reveals no gallop.  Murmur (1-2/6 SEM at RUSB.-->  Neck) heard. Pulmonary/Chest: Effort normal and breath sounds normal. No respiratory distress. He has no wheezes.  Abdominal: Soft. Bowel sounds are normal. He exhibits no distension. There is no abdominal tenderness. There is no rebound.  Obese.  Unable assess HSM  Musculoskeletal: Normal range of motion.        General: Edema (1+ BLE) present.  Neurological: He is alert and oriented to person, place, and time.  Skin: Skin is warm and dry.  Psychiatric: He has a normal mood and affect. His behavior is normal. Judgment and thought content normal.  Vitals reviewed.    Adult ECG Report None  Recent Labs: Followed by PCP No results found for: CHOL, HDL, LDLCALC, LDLDIRECT, TRIG, CHOLHDL   ASSESSMENT/PLAN    Problem List Items Addressed This Visit    Morbid obesity ([place]) (Chronic)    Continue to stress diet and exercise changes.      Essential hypertension - Primary (Chronic)     Plan:   Essential hypertension/exertional dyspnea (possible chronotropic competence): Complete current bottle of metoprolol tartrate is 1/2 tablet once daily until bottle complete.  After 2 weeks start losartan 25 mg daily.      Relevant Medications   losartan (COZAAR) 25 MG tablet   Mild aortic stenosis by prior echocardiogram (Chronic)    Mild AS: Check 2D echo prior to follow-up (would be due this spring)      Relevant Medications   losartan (COZAAR) 25 MG tablet   Other Relevant Orders   ECHOCARDIOGRAM COMPLETE   Dyspnea on exertion    Multifactorial related to obesity deconditioning and COPD.  Recheck 2D echo for possible worsening diastolic function or pulmonary hypertension with aortic stenosis and COPD.  Wean off beta-blocker for possible chronotropic incompetence, start ARB for afterload reduction.      Relevant Orders   ECHOCARDIOGRAM COMPLETE     Raymond Cuevas is pretty much stable.  Unfortunately did not wean off beta-blocker.   COVID-19 Education: The signs and symptoms of COVID-19 were discussed with the patient and how to seek care for testing (follow up with PCP or arrange E-visit).   The importance of social distancing was discussed today.  I spent a total of 12 minutes with the patient and chart review. >  50% of the time was spent in direct patient consultation.  Additional time spent with chart review (studies, outside notes, etc): 3 Total Time: 15 min   Current medicines are reviewed at length with the patient today.  (+/- concerns) n.a   Patient Instructions / Medication Changes & Studies & Tests Ordered   Patient Instructions  .Medication Instructions:     TAKE UNTIL THE BOTTLE IS EMPTY - 1/2 TABLET OF METOPROLOL A DAY  THENS TOP TAKING.    IN 2 WEEKS START TAKING LOSARTAN  25 MG  - ONE TABLET DAILY   CONTINUE ALL OTHER MEDICATIONS  *If you need a refill on your cardiac medications before your next appointment, please call your pharmacy*  Lab  Work: NOT  NEEDED   Testing/Procedures: WILL BE SCHEDULE IN MARCH 2021 -- [place] [address] Your physician has requested that you have an echocardiogram. Echocardiography is a painless test that uses sound waves to create images of your heart. It provides your doctor with information about the size and shape  of your heart and how well your heart's chambers and valves are working. This procedure takes approximately one hour. There are no restrictions for this procedure.    Follow-Up: At [facility], you and your health needs are our priority.  As part of our continuing mission to provide you with exceptional heart care, we have created designated Provider Care Teams.  These Care Teams include your primary Cardiologist (physician) and Advanced Practice Providers (APPs -  Physician Assistants and Nurse Practitioners) who all work together to provide you with the care you need, when you need it.  Your next appointment:   6 month(s)- MAY 2021  The format for your next appointment:   In Person  Provider:   Glenetta Hew, MD  Other Instructions     Studies Ordered:   Orders Placed This Encounter  Procedures  . ECHOCARDIOGRAM COMPLETE     Glenetta Hew, M.D., M.S. Interventional Cardiologist   Pager # [phone] Phone # [phone] [address] [place],  [address]   Thank you for choosing Heartcare at [facility]!!

## 2019-09-11 NOTE — Assessment & Plan Note (Signed)
Plan:   Essential hypertension/exertional dyspnea (possible chronotropic competence): Complete current bottle of metoprolol tartrate is 1/2 tablet once daily until bottle complete.  After 2 weeks start losartan 25 mg daily.

## 2019-09-11 NOTE — Assessment & Plan Note (Signed)
Multifactorial related to obesity deconditioning and COPD.  Recheck 2D echo for possible worsening diastolic function or pulmonary hypertension with aortic stenosis and COPD.  Wean off beta-blocker for possible chronotropic incompetence, start ARB for afterload reduction.

## 2019-09-11 NOTE — Assessment & Plan Note (Signed)
Continue to stress diet and exercise changes.

## 2019-09-11 NOTE — Assessment & Plan Note (Signed)
Mild AS: Check 2D echo prior to follow-up (would be due this spring)

## 2019-09-16 ENCOUNTER — Emergency Department (HOSPITAL_COMMUNITY): Payer: Medicare Other

## 2019-09-16 ENCOUNTER — Emergency Department (HOSPITAL_COMMUNITY)
Admission: EM | Admit: 2019-09-16 | Discharge: 2019-09-16 | Disposition: A | Discharge status: Home / Self Care | Payer: Medicare Other | Attending: Emergency Medicine | Admitting: Emergency Medicine

## 2019-09-16 DIAGNOSIS — M1712 Unilateral primary osteoarthritis, left knee: Secondary | ICD-10-CM | POA: Diagnosis not present

## 2019-09-16 DIAGNOSIS — Z96641 Presence of right artificial hip joint: Secondary | ICD-10-CM | POA: Insufficient documentation

## 2019-09-16 DIAGNOSIS — Z96651 Presence of right artificial knee joint: Secondary | ICD-10-CM | POA: Diagnosis not present

## 2019-09-16 DIAGNOSIS — I10 Essential (primary) hypertension: Secondary | ICD-10-CM | POA: Diagnosis not present

## 2019-09-16 DIAGNOSIS — M1612 Unilateral primary osteoarthritis, left hip: Secondary | ICD-10-CM | POA: Diagnosis not present

## 2019-09-16 DIAGNOSIS — M25562 Pain in left knee: Secondary | ICD-10-CM | POA: Insufficient documentation

## 2019-09-16 DIAGNOSIS — M79605 Pain in left leg: Secondary | ICD-10-CM | POA: Diagnosis not present

## 2019-09-16 DIAGNOSIS — Z79899 Other long term (current) drug therapy: Secondary | ICD-10-CM | POA: Diagnosis not present

## 2019-09-16 DIAGNOSIS — M25552 Pain in left hip: Secondary | ICD-10-CM | POA: Diagnosis not present

## 2019-09-16 DIAGNOSIS — M1990 Unspecified osteoarthritis, unspecified site: Secondary | ICD-10-CM | POA: Diagnosis not present

## 2019-09-16 DIAGNOSIS — R52 Pain, unspecified: Secondary | ICD-10-CM | POA: Diagnosis not present

## 2019-09-16 MED ORDER — HYDROCODONE-ACETAMINOPHEN 5-325 MG PO TABS: 1.0000 | ORAL_TABLET | Freq: Four times a day (QID) | ORAL | 0 refills | Status: DC | PRN

## 2019-09-16 MED ORDER — PREDNISONE 10 MG PO TABS: 20.0000 mg | ORAL_TABLET | Freq: Two times a day (BID) | ORAL | 0 refills | Status: DC

## 2019-09-16 MED ORDER — HYDROCODONE-ACETAMINOPHEN 5-325 MG PO TABS
2.0000 | ORAL_TABLET | Freq: Once | ORAL | Status: AC
  Administered 2019-09-16: 11:00:00 2 via ORAL
  Filled 2019-09-16: qty 2

## 2019-09-16 NOTE — ED Triage Notes (Signed)
Pt presents via EMS from home with c/o left hip pain radiating to his left knee. Pt denies any injury to that area. Pt reports he woke up this morning to use the bathroom and realized he was unable to put pressure on it at that time.

## 2019-09-16 NOTE — ED Provider Notes (Signed)
[facility] DEPT Provider Note   CSN: [id number] Arrival date & time: 09/16/19  1005     History   Chief Complaint Chief Complaint  Patient presents with  . Knee Pain  . Hip Pain    HPI Raymond Cuevas is a 85 y.o. male.     Patient is an 85 year old male with past medical history of hypertension, obesity, and osteoarthritis with prior right total hip replacement and total knee replacement.  He presents today with complaints of pain in his right knee.  This began yesterday evening in the absence of any injury or trauma.  He describes pain that radiates up to the left..  He has pain with range of motion and difficulty ambulating.  The history is provided by the patient.  Knee Pain Location:  Knee Injury: no   Knee location:  L knee Pain details:    Quality:  Throbbing   Radiates to: left hip.   Severity:  Moderate   Onset quality:  Sudden   Timing:  Constant   Progression:  Unchanged Chronicity:  New Hip Pain    Past Medical History:  Diagnosis Date  . Arthritis    osteoarthritis  . GERD (gastroesophageal reflux disease)    occ.  . 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 Mild aortic stenosis by prior echocardiogram 09/06/2018  . Exertional chest pain 01/21/2017  . Dyspnea on exertion 01/21/2017  . Essential hypertension 01/21/2017  . Morbid obesity ([place]) 08/28/2013  . 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 CATARACT EXTRACTION, BILATERAL Bilateral   . CHOLECYSTECTOMY     laparoscopic  . EYE SURGERY Left    hole repair  . JOINT REPLACEMENT Right    Knee  . NM MYOVIEW LTD  11/2016    normal EF (55 to 65%).  No ischemia or infarction.  LOW RISK.  Marland Kitchen REPLACEMENT TOTAL KNEE    . TONSILLECTOMY    . TOTAL HIP ARTHROPLASTY Right 08/27/2013   Procedure: RIGHT TOTAL HIP ARTHROPLASTY ANTERIOR APPROACH;  Surgeon: Mauri Pole, MD;   Location: [facility];  Service: Orthopedics;  Laterality: Right;  . TRANSTHORACIC ECHOCARDIOGRAM  12/2017   Mild LVH.  Normal EF 55-60%.  GR 1 DD.  No RWMA.  Mild AS (mean gradient 21 mm)        Home Medications    Prior to Admission medications   Medication Sig Start Date End Date Taking? Authorizing Provider  albuterol (VENTOLIN HFA) 108 (90 Base) MCG/ACT inhaler INHALE 2 PUFFS BY MOUTH AS NEEDED 4 TIMES DAILY UNTIL WHEEZING EXTINGUISHED. RINSE AND SPIT AFTER USE. DEMONSTRATE PROPER TECHNIQUE INHALATI 01/23/19   [provider]  calcium-vitamin D (OSCAL WITH D) 500-200 MG-UNIT per tablet Take 1 tablet by mouth.    [provider]  cephALEXin (KEFLEX) 500 MG capsule Take 1 capsule (500 mg total) by mouth 2 (two) times daily. 01/21/19   Jola Schmidt, MD  ferrous sulfate 325 (65 FE) MG tablet Take 1 tablet (325 mg total) by mouth 3 (three) times daily after meals. 08/29/13   Danae Orleans, PA-C  fish oil-omega-3 fatty acids 1000 MG capsule Take 1 g by mouth daily.    [provider]  furosemide (LASIX) 40 MG tablet Take 1 tablet (40 mg total) by mouth daily. 01/02/18   Leonie Man, MD  losartan (COZAAR) 25 MG tablet Take  1 tablet (25 mg total) by mouth daily. 09/10/19 12/09/19  Leonie Man, MD  magnesium hydroxide (MILK OF MAGNESIA) 400 MG/5ML suspension Take 30 mLs by mouth at bedtime.     [provider]    Family History Family History  Problem Relation Age of Onset  . Sudden death Sister 53    Social History Social History   Tobacco Use  . Smoking status: Never Smoker  . Smokeless tobacco: Never Used  Substance Use Topics  . Alcohol use: Yes    Alcohol/week: 1.0 standard drinks    Types: 1 Cans of beer per week    Comment: rarely  . Drug use: No     Allergies   Codeine, Morphine and related, and Sulfa antibiotics   Review of Systems Review of Systems  All other systems reviewed and are negative.    Physical Exam Updated  Vital Signs BP 139/63 (BP Location: Left Arm)   Pulse 83   Temp 98.4 F (36.9 C) (Oral)   Resp 18   Ht 6\' 2"  (1.88 m)   Wt 129.3 kg   SpO2 95%   BMI 36.59 kg/m   Physical Exam Vitals signs and nursing note reviewed.  Constitutional:      General: He is not in acute distress.    Appearance: Normal appearance. He is not ill-appearing, toxic-appearing or diaphoretic.  HENT:     Head: Normocephalic and atraumatic.  Pulmonary:     Effort: Pulmonary effort is normal.  Musculoskeletal:     Comments: The left knee has a small effusion.  There is no erythema or warmth.  He has pain with range of motion that limits exam, but there is no definitive instability with A/P drawer test or varus or valgus stress.  Skin:    General: Skin is warm and dry.  Neurological:     Mental Status: He is alert.      ED Treatments / Results  Labs (all labs ordered are listed, but only abnormal results are displayed) Labs Reviewed - No data to display  EKG None  Radiology No results found.  Procedures Procedures (including critical care time)  Medications Ordered in ED Medications  HYDROcodone-acetaminophen (NORCO/VICODIN) 5-325 MG per tablet 2 tablet (has no administration in time range)     Initial Impression / Assessment and Plan / ED Course  I have reviewed the triage vital signs and the nursing notes.  Pertinent labs & imaging results that were available during my care of the patient were reviewed by me and considered in my medical decision making (see chart for details).  Patient presents here with complaints of pain in the left knee.  This began yesterday and is rapidly worsening.  Patient has a small effusion palpable and pain with range of motion.  There is no warmth or erythema.  His x-rays show osteoarthritis and I suspect a flareup of this.  Patient will be treated with prednisone, pain medication, and follow-up with his orthopedic surgeon if not improving.  I have considered  septic joint, however doubt this to be the case.  Patient is afebrile and there is no warmth or erythema.   Final Clinical Impressions(s) / ED Diagnoses   Final diagnoses:  None    ED Discharge Orders    None       Veryl Speak, MD 09/16/19 1245

## 2019-09-16 NOTE — Discharge Instructions (Addendum)
Begin taking prednisone as prescribed.  Take hydrocodone as prescribed as needed for pain.  Follow-up with your orthopedic surgeon if symptoms or not improving in the next week, and return to the ER if you develop high fever, worsening pain, or other new and concerning symptoms.

## 2019-10-28 DIAGNOSIS — I2584 Coronary atherosclerosis due to calcified coronary lesion: Secondary | ICD-10-CM | POA: Diagnosis not present

## 2019-10-28 DIAGNOSIS — J4521 Mild intermittent asthma with (acute) exacerbation: Secondary | ICD-10-CM | POA: Diagnosis not present

## 2019-10-28 DIAGNOSIS — D692 Other nonthrombocytopenic purpura: Secondary | ICD-10-CM | POA: Diagnosis not present

## 2019-10-28 DIAGNOSIS — M1710 Unilateral primary osteoarthritis, unspecified knee: Secondary | ICD-10-CM | POA: Diagnosis not present

## 2019-10-28 DIAGNOSIS — I10 Essential (primary) hypertension: Secondary | ICD-10-CM | POA: Diagnosis not present

## 2019-12-16 HISTORY — PX: TRANSTHORACIC ECHOCARDIOGRAM: SHX275

## 2019-12-19 ENCOUNTER — Other Ambulatory Visit: Payer: Self-pay

## 2019-12-19 ENCOUNTER — Ambulatory Visit (HOSPITAL_COMMUNITY): Payer: Medicare Other | Attending: Cardiology

## 2019-12-19 DIAGNOSIS — I350 Nonrheumatic aortic (valve) stenosis: Secondary | ICD-10-CM | POA: Diagnosis not present

## 2019-12-19 DIAGNOSIS — R0600 Dyspnea, unspecified: Secondary | ICD-10-CM | POA: Insufficient documentation

## 2019-12-19 DIAGNOSIS — I119 Hypertensive heart disease without heart failure: Secondary | ICD-10-CM | POA: Insufficient documentation

## 2019-12-19 DIAGNOSIS — R0609 Other forms of dyspnea: Secondary | ICD-10-CM

## 2019-12-19 DIAGNOSIS — R079 Chest pain, unspecified: Secondary | ICD-10-CM | POA: Insufficient documentation

## 2019-12-24 ENCOUNTER — Other Ambulatory Visit: Payer: Self-pay

## 2019-12-24 DIAGNOSIS — R0609 Other forms of dyspnea: Secondary | ICD-10-CM

## 2019-12-24 DIAGNOSIS — I10 Essential (primary) hypertension: Secondary | ICD-10-CM

## 2020-03-10 ENCOUNTER — Ambulatory Visit (INDEPENDENT_AMBULATORY_CARE_PROVIDER_SITE_OTHER): Payer: Medicare Other | Admitting: Adult Health

## 2020-03-10 ENCOUNTER — Encounter: Payer: Self-pay | Admitting: Adult Health

## 2020-03-10 ENCOUNTER — Other Ambulatory Visit: Payer: Self-pay

## 2020-03-10 VITALS — BP 116/70 | HR 67 | Temp 97.5°F | Resp 16 | Ht 74.0 in | Wt 281.6 lb

## 2020-03-10 DIAGNOSIS — I10 Essential (primary) hypertension: Secondary | ICD-10-CM | POA: Diagnosis not present

## 2020-03-10 DIAGNOSIS — I350 Nonrheumatic aortic (valve) stenosis: Secondary | ICD-10-CM

## 2020-03-10 NOTE — Progress Notes (Signed)
Cardiology Office Note   Date:  03/10/2020   ID:  Raymond Cuevas, DOB 02/01/1934, MRN [id number]  PCP:  Deland Pretty, MD  Cardiologist: Dr. Ellyn Hack CC: Follow up    History of Present Illness: Raymond Cuevas is a 86 y.o. male who presents for ongoing assessment and management of chest pain, mid axillary, left upper chest/breast area discomfort that feels like a pulling sensation on and off.  It was not associated with any particular activity.  The patient is active walking 2 to 2-1/2 miles a day without any issues of dyspnea or worsening discomfort.  He did admit that when he walked up an incline or up steps he got short of breath, however this was baseline for him.  The patient is on Lasix for chronic edema.  Raymond Cuevas has a history of morbid obesity, hypertension, moderate aortic valve stenosis per echocardiogram, which was completed on 12/19/2019, stable compared to 2 years prior.  Mean gradient is minimally up from 19 to 21 mmHg.   Raymond Cuevas was last seen by Dr. Ellyn Hack on 09/10/2019.  He was weaned off beta-blocker as this was thought to be causing possible chronotropic incompetence.  He was started on ARB for afterload reduction.  He comes today without cardiac complaint.  He has had several medication changes by his primary care physician.  We have adjusted those in his medication list.  He is no longer taking furosemide 40 mg daily, his prednisone dose is 10 mg daily (instead of 20 mg twice daily.  He is followed by Dr. Shelia Media for labs every 6 months.  He continues to have some complaints of dyspnea on exertion although not extreme.  He has a driveway that is [id number] feet long, and he walks up and down the driveway 4 times for exercise.  He has to stop and rest on occasion.  He cleans his own house and does all of his ADLs.  He states his he takes his time because he feels his age when he is doing a lot of activity.  Past Medical History:  Diagnosis Date  . Arthritis    osteoarthritis  . GERD (gastroesophageal reflux disease)    occ.  . 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 CATARACT EXTRACTION, BILATERAL Bilateral   . CHOLECYSTECTOMY     laparoscopic  . EYE SURGERY Left    hole repair  . JOINT REPLACEMENT Right    Knee  . NM MYOVIEW LTD  11/2016    normal EF (55 to 65%).  No ischemia or infarction.  LOW RISK.  Marland Kitchen REPLACEMENT TOTAL KNEE    . TONSILLECTOMY    . TOTAL HIP ARTHROPLASTY Right 08/27/2013   Procedure: RIGHT TOTAL HIP ARTHROPLASTY ANTERIOR APPROACH;  Surgeon: Mauri Pole, MD;  Location: [facility];  Service: Orthopedics;  Laterality: Right;  . TRANSTHORACIC ECHOCARDIOGRAM  12/2017   Mild LVH.  Normal EF 55-60%.  GR 1 DD.  No RWMA.  Mild AS (mean gradient 21 mm)     Current Outpatient Medications  Medication Sig Dispense Refill  . albuterol (VENTOLIN HFA) 108 (90 Base) MCG/ACT inhaler INHALE 2 PUFFS BY MOUTH AS NEEDED 4 TIMES DAILY UNTIL WHEEZING EXTINGUISHED. RINSE AND SPIT AFTER USE. DEMONSTRATE PROPER TECHNIQUE INHALATI    . aspirin EC 81 MG tablet Take 81 mg by mouth daily.    . calcium-vitamin D (OSCAL WITH D) 500-200 MG-UNIT per tablet Take 1 tablet by mouth.    Marland Kitchen  ferrous sulfate 325 (65 FE) MG tablet Take 1 tablet (325 mg total) by mouth 3 (three) times daily after meals.  3  . fish oil-omega-3 fatty acids 1000 MG capsule Take 1 g by mouth daily.    Marland Kitchen HYDROcodone-acetaminophen (NORCO) 5-325 MG tablet Take 1-2 tablets by mouth every 6 (six) hours as needed. 15 tablet 0  . losartan (COZAAR) 25 MG tablet Take 1 tablet (25 mg total) by mouth daily. 90 tablet 3  . magnesium hydroxide (MILK OF MAGNESIA) 400 MG/5ML suspension Take 30 mLs by mouth at bedtime.     . predniSONE (DELTASONE) 10 MG tablet Take 10 mg by mouth daily with breakfast.     No current facility-administered medications for this visit.    Allergies:   Codeine, Morphine and related, and Sulfa antibiotics    Social  History:  The patient  reports that he has never smoked. He has never used smokeless tobacco. He reports current alcohol use of about 1.0 standard drinks of alcohol per week. He reports that he does not use drugs.   Family History:  The patient's family history includes Sudden death (age of onset: 80) in his sister.    ROS: All other systems are reviewed and negative. Unless otherwise mentioned in H&P    PHYSICAL EXAM: VS:  BP 116/70   Pulse 67   Temp (!) 97.5 F (36.4 C)   Resp 16   Ht 6\' 2"  (1.88 m)   Wt 281 lb 9.6 oz (127.7 kg)   SpO2 97%   BMI 36.16 kg/m  , BMI Body mass index is 36.16 kg/m. GEN: Well nourished, well developed, in no acute distress, obese HEENT: normal Neck: no JVD, bilateral radiation carotid bruits, or masses Cardiac: RRR; 2/6 holosystolic murmur, with preserved S2, rubs, or gallops,no edema  Respiratory:  Clear to auscultation bilaterally, normal work of breathing GI: soft, nontender, nondistended, + BS MS: no deformity or atrophy Skin: warm and dry, no rash Neuro:  Strength and sensation are intact Psych: euthymic mood, full affect   EKG: Normal sinus rhythm, heart rate of 67 bpm (personally reviewed)  Recent Labs: No results found for requested labs within last 8760 hours.    Lipid Panel No results found for: CHOL, TRIG, HDL, CHOLHDL, VLDL, LDLCALC, LDLDIRECT    Wt Readings from Last 3 Encounters:  03/10/20 281 lb 9.6 oz (127.7 kg)  09/16/19 285 lb (129.3 kg)  09/10/19 295 lb (133.8 kg)      Other studies Reviewed: Echocardiogram 18-Jan-2020 1. Left ventricular ejection fraction, by estimation, is 55 to 60%. The  left ventricle has normal function. The left ventricle has no regional  wall motion abnormalities. There is moderate concentric left ventricular  hypertrophy. Left ventricular  diastolic parameters are consistent with Grade I diastolic dysfunction  (impaired relaxation).  2. Right ventricular systolic function is normal. The  right ventricular  size is normal. There is normal pulmonary artery systolic pressure.  3. The mitral valve is normal in structure and function. Trivial mitral  valve regurgitation. No evidence of mitral stenosis.  4. The aortic valve is normal in structure and function. Aortic valve  regurgitation is mild. Moderate aortic valve stenosis. Aortic valve mean  gradient measures 21.0 mmHg.  5. The inferior vena cava is normal in size with greater than 50%  respiratory variability, suggesting right atrial pressure of 3 mmHg.    ASSESSMENT AND PLAN:  1.  Moderate aortic valve stenosis: Most recent echocardiogram 01/18/20 revealed stable aortic valve.  He will need follow-up echocardiogram in 6 months for annual evaluation.  He continues on ARB for afterload reduction.  He is no longer on beta-blocker and feels better concerning his energy.  He denies any rapid heart rhythm or chest discomfort.  He denies chest pain, dizziness, or significant fatigue.  2.  Hypertension: Currently well controlled on losartan 25 mg daily.  He is no longer taking Lasix.  There is no evidence of fluid overload at this time.  No changes or additions at this time.  Labs are completed by PCP.  If not available on next office visit these will be drawn.  3.  Chronic dyspnea on exertion: Self-limiting.  He denies any coughing, wheezing, or significant dyspnea with exertion.  He does walk up and down his driveway and clean his house.  He does have to stop and rest occasionally.  He recovers quickly, less than 5 minutes.  He does have albuterol inhaler which he uses as needed.  He is followed by Dr. Shelia Media for management.  If symptoms worsen, can consider reevaluation for aortic valve repair should echocardiogram revealed progression of aortic valve disease.   Current medicines are reviewed at length with the patient today.  I have spent 25 minutes dedicated to the care of this patient on the date of this encounter to include  pre-visit review of records, assessment, management and diagnostic testing,with shared decision making.  Labs/ tests ordered today include: None Phill Myron. West Pugh, ANP, AACC   03/10/2020 5:04 PM    [facility] [place] [address] Office [phone] Fax [phone]  Notice: This dictation was prepared with Dragon dictation along with smaller phrase technology. Any transcriptional errors that result from this process are unintentional and may not be corrected upon review.

## 2020-03-10 NOTE — Patient Instructions (Signed)

## 2020-03-24 DIAGNOSIS — R0609 Other forms of dyspnea: Secondary | ICD-10-CM | POA: Diagnosis not present

## 2020-03-24 DIAGNOSIS — R0600 Dyspnea, unspecified: Secondary | ICD-10-CM | POA: Diagnosis not present

## 2020-03-24 DIAGNOSIS — R911 Solitary pulmonary nodule: Secondary | ICD-10-CM | POA: Diagnosis not present

## 2020-03-24 DIAGNOSIS — R0989 Other specified symptoms and signs involving the circulatory and respiratory systems: Secondary | ICD-10-CM | POA: Diagnosis not present

## 2020-03-24 DIAGNOSIS — I10 Essential (primary) hypertension: Secondary | ICD-10-CM | POA: Diagnosis not present

## 2020-05-01 ENCOUNTER — Institutional Professional Consult (permissible substitution): Payer: Medicare Other | Admitting: Emergency Medicine

## 2020-05-06 ENCOUNTER — Ambulatory Visit (INDEPENDENT_AMBULATORY_CARE_PROVIDER_SITE_OTHER): Payer: Medicare Other

## 2020-05-06 ENCOUNTER — Encounter: Payer: Self-pay | Admitting: Pulmonary Disease

## 2020-05-06 ENCOUNTER — Ambulatory Visit (INDEPENDENT_AMBULATORY_CARE_PROVIDER_SITE_OTHER): Payer: Medicare Other | Admitting: Pulmonary Disease

## 2020-05-06 ENCOUNTER — Other Ambulatory Visit: Payer: Self-pay

## 2020-05-06 VITALS — BP 128/76 | HR 60 | Temp 98.0°F | Ht 74.0 in | Wt 285.4 lb

## 2020-05-06 DIAGNOSIS — J9811 Atelectasis: Secondary | ICD-10-CM | POA: Diagnosis not present

## 2020-05-06 DIAGNOSIS — R0600 Dyspnea, unspecified: Secondary | ICD-10-CM | POA: Diagnosis not present

## 2020-05-06 DIAGNOSIS — R0609 Other forms of dyspnea: Secondary | ICD-10-CM

## 2020-05-06 MED ORDER — BUDESONIDE-FORMOTEROL FUMARATE 160-4.5 MCG/ACT IN AERO: 2.0000 | INHALATION_SPRAY | Freq: Two times a day (BID) | RESPIRATORY_TRACT | 6 refills | Status: DC

## 2020-05-06 NOTE — Patient Instructions (Addendum)
We will schedule for chest x-ray and get pulmonary function test We will start you on an inhaler called Symbicort 160/4.5  Follow-up in 1 to 2 months.

## 2020-05-06 NOTE — Progress Notes (Addendum)
Raymond Cuevas    [id number]    1933/11/08  Primary Care Physician:Pharr, Thayer Jew, MD  Referring Physician: Deland Pretty, MD [address] [place],  [place] [id number]  Chief complaint: Follow-up for dyspnea  HPI: 86 year old with history of GERD, allergic rhinitis.  Seen by primary care on 01/15/18 for cough, wheezing, diagnosed with asthmatic bronchitis and treated with azithromycin and steroids.  Chest x-ray some mild scarring at the base with no acute process. He states that his breathing is much improved today.  Denies any cough, sputum production, wheezing He has been outside doing yard work and mowing the lawn yesterday without any shortness of breath or respiratory issues  He had a CT angio in March 2019 which did not show any pulmonary embolism or acute lung abnormality.  There were 2 subcentimeter pulmonary nodules in the right lower lobe and left lower lobe  Pets: No pets.  He has a bird feeder in front yard and is exposed to wild birds Occupation: Retired Administrator Exposures: No dampness, mold , jacuzzi,, hot tub Smoking history: Never smoker Travel history: Grew up in [place].  No significant travel  Interim history: Back in pulmonary clinic after a procedure of 2 years.  Previously seen in 2019 for asthmatic bronchitis that responded to antibiotics, prednisone Not on inhaler medication  Complains of increasing dyspnea on exertion, no wheezing.  Denies any cough, sputum production. Had a chest x-ray a couple of months ago at primary care with no acute abnormalities. States that his neighbors do not let him do outside yard work due to concern for his breathing  Outpatient Encounter Medications as of 05/06/2020  Medication Sig  . albuterol (VENTOLIN HFA) 108 (90 Base) MCG/ACT inhaler INHALE 2 PUFFS BY MOUTH AS NEEDED 4 TIMES DAILY UNTIL WHEEZING EXTINGUISHED. RINSE AND SPIT AFTER USE. DEMONSTRATE PROPER TECHNIQUE INHALATI  . aspirin EC 81 MG  tablet Take 81 mg by mouth daily.  . calcium-vitamin D (OSCAL WITH D) 500-200 MG-UNIT per tablet Take 1 tablet by mouth.  . ferrous sulfate 325 (65 FE) MG tablet Take 1 tablet (325 mg total) by mouth 3 (three) times daily after meals.  . fish oil-omega-3 fatty acids 1000 MG capsule Take 1 g by mouth daily.  Marland Kitchen HYDROcodone-acetaminophen (NORCO) 5-325 MG tablet Take 1-2 tablets by mouth every 6 (six) hours as needed.  . magnesium hydroxide (MILK OF MAGNESIA) 400 MG/5ML suspension Take 30 mLs by mouth at bedtime.   . predniSONE (DELTASONE) 10 MG tablet Take 10 mg by mouth daily with breakfast.  . losartan (COZAAR) 25 MG tablet Take 1 tablet (25 mg total) by mouth daily.   No facility-administered encounter medications on file as of 05/06/2020.   Physical Exam: Blood pressure 128/76, pulse 60, temperature 98 F (36.7 C), temperature source Oral, height 6\' 2"  (1.88 m), weight 285 lb 6.4 oz (129.5 kg), SpO2 97 %. Gen:      No acute distress HEENT:  EOMI, sclera anicteric Neck:     No masses; no thyromegaly Lungs:    Clear to auscultation bilaterally; normal respiratory effort CV:         Regular rate and rhythm; no murmurs Abd:      + bowel sounds; soft, non-tender; no palpable masses, no distension Ext:    No edema; adequate peripheral perfusion Skin:      Warm and dry; no rash Neuro: alert and oriented x 3 Psych: normal mood and affect  Data Reviewed: Imaging: Chest x-ray 01/15/17 Mild scarring at the left base, no edema or consolidation.  Stable cardiac silhouette.  CT chest 01/08/2018 No acute lung process.  Mild atelectasis at the bases.  4 mm right lower lobe, 5 mm left lower lobe nodule.  CT chest 05/03/2019-stable pulmonary nodules  Chest x-ray report from primary care 03/25/2020-no acute abnormality.  Reviewed the images personally  PFTs 03/15/2018 FVC 3.86 [86%], FEV1 2.99 [94%), F/F 78, TLC 91%, DLCO 69%, DLCO/VA 83% Obstruction or restriction.  Mild diffusion defect that  corrects for alveolar volume  FENO 02/09/2018-19   Labs CBC 02/09/2018-WBC 5.9, eos 2.9%, absolute eosinophil count 200 Blood allergy profile/26/19-IgE 20, RAST panel shows mild allergies to tree pollen  Assessment:  Mild asthma Has recurrent episodes of dyspnea, wheezing. Previously PFTs reviewed with no obstruction or eosinophilia We will get a chest x-ray today, repeat pulmonary function test Trial of Symbicort 160/4.5.  Subcentimeter pulmonary nodule Follow-up CT chest. Likely benign in a non-smoker.  Plan/Recommendations: -Chest x-ray, PFTs -Symbicort  [name] [facility] 05/06/2020, 2:50 PM  CC: Deland Pretty, MD

## 2020-05-09 ENCOUNTER — Encounter: Payer: Self-pay | Admitting: Pulmonary Disease

## 2020-05-13 ENCOUNTER — Ambulatory Visit: Payer: Medicare Other | Admitting: Internal Medicine

## 2020-06-25 DIAGNOSIS — I10 Essential (primary) hypertension: Secondary | ICD-10-CM | POA: Diagnosis not present

## 2020-06-25 DIAGNOSIS — R739 Hyperglycemia, unspecified: Secondary | ICD-10-CM | POA: Diagnosis not present

## 2020-06-25 DIAGNOSIS — R972 Elevated prostate specific antigen [PSA]: Secondary | ICD-10-CM | POA: Diagnosis not present

## 2020-06-25 DIAGNOSIS — E782 Mixed hyperlipidemia: Secondary | ICD-10-CM | POA: Diagnosis not present

## 2020-06-30 ENCOUNTER — Encounter: Payer: Self-pay | Admitting: Pulmonary Disease

## 2020-06-30 ENCOUNTER — Ambulatory Visit (INDEPENDENT_AMBULATORY_CARE_PROVIDER_SITE_OTHER): Payer: Medicare Other | Admitting: Pulmonary Disease

## 2020-06-30 ENCOUNTER — Other Ambulatory Visit: Payer: Self-pay

## 2020-06-30 ENCOUNTER — Telehealth: Payer: Self-pay | Admitting: Pulmonary Disease

## 2020-06-30 VITALS — BP 120/70 | HR 60 | Temp 97.1°F | Ht 75.0 in | Wt 286.4 lb

## 2020-06-30 DIAGNOSIS — R0609 Other forms of dyspnea: Secondary | ICD-10-CM

## 2020-06-30 DIAGNOSIS — R0600 Dyspnea, unspecified: Secondary | ICD-10-CM

## 2020-06-30 DIAGNOSIS — R0602 Shortness of breath: Secondary | ICD-10-CM | POA: Diagnosis not present

## 2020-06-30 LAB — PULMONARY FUNCTION TEST
DL/VA % pred: 110 %
DL/VA: 4.18 ml/min/mmHg/L
DLCO cor % pred: 97 %
DLCO cor: 25.39 ml/min/mmHg
DLCO unc % pred: 97 %
DLCO unc: 25.39 ml/min/mmHg
FEF 25-75 Post: 0.94 L/sec
FEF 25-75 Pre: 2.07 L/sec
FEF2575-%Change-Post: -54 %
FEF2575-%Pred-Post: 45 %
FEF2575-%Pred-Pre: 100 %
FEV1-%Change-Post: -6 %
FEV1-%Pred-Post: 71 %
FEV1-%Pred-Pre: 76 %
FEV1-Post: 2.21 L
FEV1-Pre: 2.38 L
FEV1FVC-%Change-Post: 3 %
FEV1FVC-%Pred-Pre: 94 %
FEV6-%Change-Post: -15 %
FEV6-%Pred-Post: 70 %
FEV6-%Pred-Pre: 82 %
FEV6-Post: 2.86 L
FEV6-Pre: 3.38 L
FEV6FVC-%Pred-Post: 107 %
FEV6FVC-%Pred-Pre: 107 %
FVC-%Change-Post: -10 %
FVC-%Pred-Post: 72 %
FVC-%Pred-Pre: 81 %
FVC-Post: 3.19 L
FVC-Pre: 3.55 L
Post FEV1/FVC ratio: 69 %
Post FEV6/FVC ratio: 100 %
Pre FEV1/FVC ratio: 67 %
Pre FEV6/FVC Ratio: 100 %

## 2020-06-30 NOTE — Progress Notes (Signed)
QUE MENEELY    [id number]    04-Jun-1934  Primary Care Physician:Pharr, Thayer Jew, MD  Referring Physician: Deland Pretty, MD [place] [address] [place],  [place] [address]  Chief complaint: Follow-up for dyspnea  HPI: 86 year old with history of GERD, allergic rhinitis.  Seen by primary care on 01/15/18 for cough, wheezing, diagnosed with asthmatic bronchitis and treated with azithromycin and steroids.  Chest x-ray some mild scarring at the base with no acute process. He states that his breathing is much improved today.  Denies any cough, sputum production, wheezing He has been outside doing yard work and mowing the lawn yesterday without any shortness of breath or respiratory issues  He had a CT angio in March 2019 which did not show any pulmonary embolism or acute lung abnormality.  There were 2 subcentimeter pulmonary nodules in the right lower lobe and left lower lobe.  Seen back in pulmonary clinic in 2021 after a gap of 2 years for increasing dyspnea on exertion.  States that his neighbors do not let him do outside yard work due to concern for his breathing. Started on Symbicort inhaler  Pets: No pets.  He has a bird feeder in front yard and is exposed to wild birds Occupation: Retired Administrator Exposures: No dampness, mold , jacuzzi,, hot tub Smoking history: Never smoker Travel history: Grew up in [place].  No significant travel  Interim history: Started on Symbicort inhaler at last visit.  Please note feels that he may have a difference to his breathing Continues to have dyspnea on exertion.  Outpatient Encounter Medications as of 06/30/2020  Medication Sig  . albuterol (VENTOLIN HFA) 108 (90 Base) MCG/ACT inhaler INHALE 2 PUFFS BY MOUTH AS NEEDED 4 TIMES DAILY UNTIL WHEEZING EXTINGUISHED. RINSE AND SPIT AFTER USE. DEMONSTRATE PROPER TECHNIQUE INHALATI  . aspirin EC 81 MG tablet Take 81 mg by mouth daily.  . budesonide-formoterol (SYMBICORT) 160-4.5  MCG/ACT inhaler Inhale 2 puffs into the lungs in the morning and at bedtime.  . calcium-vitamin D (OSCAL WITH D) 500-200 MG-UNIT per tablet Take 1 tablet by mouth.  . ferrous sulfate 325 (65 FE) MG tablet Take 1 tablet (325 mg total) by mouth 3 (three) times daily after meals.  . fish oil-omega-3 fatty acids 1000 MG capsule Take 1 g by mouth daily.  Marland Kitchen HYDROcodone-acetaminophen (NORCO) 5-325 MG tablet Take 1-2 tablets by mouth every 6 (six) hours as needed.  . magnesium hydroxide (MILK OF MAGNESIA) 400 MG/5ML suspension Take 30 mLs by mouth at bedtime.   . predniSONE (DELTASONE) 10 MG tablet Take 10 mg by mouth daily with breakfast.  . losartan (COZAAR) 25 MG tablet Take 1 tablet (25 mg total) by mouth daily.   No facility-administered encounter medications on file as of 06/30/2020.   Physical Exam: Blood pressure 120/70, pulse 60, temperature (!) 97.1 F (36.2 C), temperature source Oral, height 6\' 3"  (1.905 m), weight 286 lb 6.4 oz (129.9 kg), SpO2 96 %. Gen:      No acute distress HEENT:  EOMI, sclera anicteric Neck:     No masses; no thyromegaly Lungs:    Clear to auscultation bilaterally; normal respiratory effort CV:         Regular rate and rhythm; no murmurs Abd:      + bowel sounds; soft, non-tender; no palpable masses, no distension Ext:    No edema; adequate peripheral perfusion Skin:      Warm and dry; no  rash Neuro: alert and oriented x 3 Psych: normal mood and affect  Data Reviewed: Imaging: Chest x-ray 01/15/17 Mild scarring at the left base, no edema or consolidation.  Stable cardiac silhouette.  CT chest 01/08/2018 No acute lung process.  Mild atelectasis at the bases.  4 mm right lower lobe, 5 mm left lower lobe nodule.  CT chest 05/03/2019-stable pulmonary nodules  Chest x-ray report from primary care 03/25/2020-no acute abnormality.  Chest x-ray 05/06/2020-low lung volumes with atelectasis at the bases. I have reviewed the images personally.  PFTs 03/15/2018 FVC  3.86 [86%], FEV1 2.99 [94%), F/F 78, TLC 91%, DLCO 69%, DLCO/VA 83% No obstruction or restriction.  Mild diffusion defect that corrects for alveolar volume  06/30/2020 FVC 3.19 [72%], FEV1 2.1 [71%], F/F 69 Normal spirometry  FENO 02/09/2018-19   Labs CBC 02/09/2018-WBC 5.9, eos 2.9%, absolute eosinophil count 200 Blood allergy profile/26/19-IgE 20, RAST panel shows mild allergies to tree pollen  Assessment:  Mild asthma Has recurrent episodes of dyspnea, wheezing. PFTs reviewed with no obstruction or eosinophilia Continue on Symbicort  Given persistent dyspnea will get a CT for better evaluation as previous chest x-ray shows mild opacities at the base.  Subcentimeter pulmonary nodule Follow-up CT chest. Likely benign in a non-smoker.  Plan/Recommendations: -HRCT -Symbicort  [name] [facility] 06/30/2020, 2:06 PM  CC: Deland Pretty, MD

## 2020-06-30 NOTE — Patient Instructions (Signed)
I have reviewed your lung function test which does not show significant impairment Since you continue to be very short of breath we will get a high-resolution CT for better evaluation Follow-up in 3 months.

## 2020-06-30 NOTE — Progress Notes (Signed)
Pre and post Spirometry and DLCO completed today

## 2020-06-30 NOTE — Telephone Encounter (Signed)
PCC's did you call the pt? He was just here and CT was ordered. I do not see where anyone else called him.

## 2020-06-30 NOTE — Telephone Encounter (Signed)
Pt aware of appt.

## 2020-07-07 ENCOUNTER — Ambulatory Visit (HOSPITAL_COMMUNITY): Payer: Medicare Other

## 2020-07-29 DIAGNOSIS — I10 Essential (primary) hypertension: Secondary | ICD-10-CM | POA: Diagnosis not present

## 2020-07-29 DIAGNOSIS — K219 Gastro-esophageal reflux disease without esophagitis: Secondary | ICD-10-CM | POA: Diagnosis not present

## 2020-07-29 DIAGNOSIS — D485 Neoplasm of uncertain behavior of skin: Secondary | ICD-10-CM | POA: Diagnosis not present

## 2020-07-29 DIAGNOSIS — Z0001 Encounter for general adult medical examination with abnormal findings: Secondary | ICD-10-CM | POA: Diagnosis not present

## 2020-07-29 DIAGNOSIS — J4521 Mild intermittent asthma with (acute) exacerbation: Secondary | ICD-10-CM | POA: Diagnosis not present

## 2020-07-29 DIAGNOSIS — I700 Atherosclerosis of aorta: Secondary | ICD-10-CM | POA: Diagnosis not present

## 2020-07-29 DIAGNOSIS — R911 Solitary pulmonary nodule: Secondary | ICD-10-CM | POA: Diagnosis not present

## 2020-07-29 DIAGNOSIS — Z23 Encounter for immunization: Secondary | ICD-10-CM | POA: Diagnosis not present

## 2020-07-29 DIAGNOSIS — J309 Allergic rhinitis, unspecified: Secondary | ICD-10-CM | POA: Diagnosis not present

## 2020-07-29 DIAGNOSIS — I2510 Atherosclerotic heart disease of native coronary artery without angina pectoris: Secondary | ICD-10-CM | POA: Diagnosis not present

## 2020-07-29 DIAGNOSIS — D692 Other nonthrombocytopenic purpura: Secondary | ICD-10-CM | POA: Diagnosis not present

## 2020-08-27 ENCOUNTER — Other Ambulatory Visit: Payer: Self-pay

## 2020-08-27 ENCOUNTER — Emergency Department (HOSPITAL_COMMUNITY)
Admission: EM | Admit: 2020-08-27 | Discharge: 2020-08-27 | Disposition: A | Discharge status: Home / Self Care | Payer: Medicare Other | Attending: Emergency Medicine | Admitting: Emergency Medicine

## 2020-08-27 ENCOUNTER — Encounter (HOSPITAL_COMMUNITY): Payer: Self-pay

## 2020-08-27 DIAGNOSIS — L723 Sebaceous cyst: Secondary | ICD-10-CM | POA: Insufficient documentation

## 2020-08-27 DIAGNOSIS — Z79899 Other long term (current) drug therapy: Secondary | ICD-10-CM | POA: Diagnosis not present

## 2020-08-27 DIAGNOSIS — Z7982 Long term (current) use of aspirin: Secondary | ICD-10-CM | POA: Insufficient documentation

## 2020-08-27 DIAGNOSIS — Z96651 Presence of right artificial knee joint: Secondary | ICD-10-CM | POA: Insufficient documentation

## 2020-08-27 DIAGNOSIS — Z96641 Presence of right artificial hip joint: Secondary | ICD-10-CM | POA: Diagnosis not present

## 2020-08-27 DIAGNOSIS — I10 Essential (primary) hypertension: Secondary | ICD-10-CM | POA: Insufficient documentation

## 2020-08-27 DIAGNOSIS — R222 Localized swelling, mass and lump, trunk: Secondary | ICD-10-CM | POA: Diagnosis present

## 2020-08-27 NOTE — ED Triage Notes (Signed)
Patient c/o raised mole/bump on left upper back that he noticed 4 days ago.   Pt c/o dull ache   A/Ox4 Ambulatory in triage.

## 2020-08-27 NOTE — Discharge Instructions (Signed)
You have what may be a sebaceous cyst on your back.  Follow-up with your doctor or with dermatology.

## 2020-08-27 NOTE — ED Provider Notes (Signed)
[facility] DEPT Provider Note   CSN: [id number] Arrival date & time: 08/27/20  1230     History Chief Complaint  Patient presents with  . Raised Mole/Bump    upper back     Raymond Cuevas is a 86 y.o. male.  HPI Patient presents with a swollen bump on his left back.  Noticed about 4 days ago.  Has dull ache.  No fevers.  States he has not noticed this before.  Has not had drainage.  Has not seen anyone else for it.    Past Medical History:  Diagnosis Date  . Arthritis    osteoarthritis  . GERD (gastroesophageal reflux disease)    occ.  . 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 Mild aortic stenosis by prior echocardiogram 09/06/2018  . Exertional chest pain 01/21/2017  . Dyspnea on exertion 01/21/2017  . Essential hypertension 01/21/2017  . Morbid obesity ([place]) 08/28/2013  . 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 CATARACT EXTRACTION, BILATERAL Bilateral   . CHOLECYSTECTOMY     laparoscopic  . EYE SURGERY Left    hole repair  . JOINT REPLACEMENT Right    Knee  . NM MYOVIEW LTD  11/2016    normal EF (55 to 65%).  No ischemia or infarction.  LOW RISK.  Marland Kitchen REPLACEMENT TOTAL KNEE    . TONSILLECTOMY    . TOTAL HIP ARTHROPLASTY Right 08/27/2013   Procedure: RIGHT TOTAL HIP ARTHROPLASTY ANTERIOR APPROACH;  Surgeon: Mauri Pole, MD;  Location: [facility];  Service: Orthopedics;  Laterality: Right;  . TRANSTHORACIC ECHOCARDIOGRAM  12/2017   Mild LVH.  Normal EF 55-60%.  GR 1 DD.  No RWMA.  Mild AS (mean gradient 21 mm)       Family History  Problem Relation Age of Onset  . Sudden death Sister 79    Social History   Tobacco Use  . Smoking status: Never Smoker  . Smokeless tobacco: Never Used  Vaping Use  . Vaping Use: Never used  Substance Use Topics  . Alcohol use: Yes    Alcohol/week: 1.0 standard drink    Types: 1 Cans of beer  per week    Comment: rarely  . Drug use: No    Home Medications Prior to Admission medications   Medication Sig Start Date End Date Taking? Authorizing Provider  albuterol (VENTOLIN HFA) 108 (90 Base) MCG/ACT inhaler INHALE 2 PUFFS BY MOUTH AS NEEDED 4 TIMES DAILY UNTIL WHEEZING EXTINGUISHED. RINSE AND SPIT AFTER USE. DEMONSTRATE PROPER TECHNIQUE INHALATI 01/23/19   [provider]  aspirin EC 81 MG tablet Take 81 mg by mouth daily.    [provider]  budesonide-formoterol (SYMBICORT) 160-4.5 MCG/ACT inhaler Inhale 2 puffs into the lungs in the morning and at bedtime. 05/06/20   Mannam, Hart Robinsons, MD  calcium-vitamin D (OSCAL WITH D) 500-200 MG-UNIT per tablet Take 1 tablet by mouth.    [provider]  ferrous sulfate 325 (65 FE) MG tablet Take 1 tablet (325 mg total) by mouth 3 (three) times daily after meals. 08/29/13   Raymond Orleans, PA-C  fish oil-omega-3 fatty acids 1000 MG capsule Take 1 g by mouth daily.    [provider]  HYDROcodone-acetaminophen (NORCO) 5-325 MG tablet Take 1-2 tablets by mouth every 6 (six) hours as needed. 09/16/19   Raymond Speak, MD  losartan (COZAAR)  25 MG tablet Take 1 tablet (25 mg total) by mouth daily. 09/10/19 03/10/20  Leonie Man, MD  magnesium hydroxide (MILK OF MAGNESIA) 400 MG/5ML suspension Take 30 mLs by mouth at bedtime.     [provider]  predniSONE (DELTASONE) 10 MG tablet Take 10 mg by mouth daily with breakfast.    [provider]    Allergies    Codeine, Morphine and related, and Sulfa antibiotics  Review of Systems   Review of Systems  Constitutional: Negative for appetite change and unexpected weight change.  Musculoskeletal: Negative for back pain.  Skin: Positive for wound.  Neurological: Negative for weakness.    Physical Exam Updated Vital Signs BP 131/70 (BP Location: Left Arm)   Pulse 61   Temp 98.1 F (36.7 C) (Oral)   Resp 17   SpO2 95%   Physical  Exam Vitals and nursing note reviewed.  Constitutional:      Appearance: Normal appearance.  Skin:    Comments: On left upper back there is approximately 1 cm round raised area.  Does have central area that appears could be consistent with sebaceous cyst.  No erythema.  No fluctuance.  Neurological:     Mental Status: He is alert.     ED Results / Procedures / Treatments   Labs (all labs ordered are listed, but only abnormal results are displayed) Labs Reviewed - No data to display  EKG None  Radiology No results found.  Procedures Procedures (including critical care time)  Medications Ordered in ED Medications - No data to display  ED Course  I have reviewed the triage vital signs and the nursing notes.  Pertinent labs & imaging results that were available during my care of the patient were reviewed by me and considered in my medical decision making (see chart for details).    MDM Rules/Calculators/A&P                          Patient with area on back this likely sebaceous cyst.  Central sebaceous area somewhat removed.  Still has somewhat firm area.  Will have follow-up with PCP and potentially dermatology.  Malignancy considered but felt less likely. Final Clinical Impression(s) / ED Diagnoses Final diagnoses:  Sebaceous cyst    Rx / DC Orders ED Discharge Orders    None       Davonna Belling, MD 08/27/20 1706

## 2020-09-04 DIAGNOSIS — I2510 Atherosclerotic heart disease of native coronary artery without angina pectoris: Secondary | ICD-10-CM | POA: Diagnosis not present

## 2020-09-08 DIAGNOSIS — C44629 Squamous cell carcinoma of skin of left upper limb, including shoulder: Secondary | ICD-10-CM | POA: Diagnosis not present

## 2020-09-08 DIAGNOSIS — I2510 Atherosclerotic heart disease of native coronary artery without angina pectoris: Secondary | ICD-10-CM | POA: Diagnosis not present

## 2020-09-08 DIAGNOSIS — Z23 Encounter for immunization: Secondary | ICD-10-CM | POA: Diagnosis not present

## 2020-09-08 DIAGNOSIS — I10 Essential (primary) hypertension: Secondary | ICD-10-CM | POA: Diagnosis not present

## 2020-09-08 DIAGNOSIS — D492 Neoplasm of unspecified behavior of bone, soft tissue, and skin: Secondary | ICD-10-CM | POA: Diagnosis not present

## 2020-09-14 ENCOUNTER — Other Ambulatory Visit: Payer: Self-pay

## 2020-09-14 ENCOUNTER — Ambulatory Visit (INDEPENDENT_AMBULATORY_CARE_PROVIDER_SITE_OTHER): Payer: Medicare Other | Admitting: Cardiology

## 2020-09-14 ENCOUNTER — Encounter: Payer: Self-pay | Admitting: Cardiology

## 2020-09-14 VITALS — BP 127/64 | HR 65 | Ht 74.0 in | Wt 292.8 lb

## 2020-09-14 DIAGNOSIS — I10 Essential (primary) hypertension: Secondary | ICD-10-CM

## 2020-09-14 DIAGNOSIS — R0600 Dyspnea, unspecified: Secondary | ICD-10-CM | POA: Diagnosis not present

## 2020-09-14 DIAGNOSIS — R0609 Other forms of dyspnea: Secondary | ICD-10-CM

## 2020-09-14 DIAGNOSIS — I350 Nonrheumatic aortic (valve) stenosis: Secondary | ICD-10-CM

## 2020-09-14 DIAGNOSIS — E785 Hyperlipidemia, unspecified: Secondary | ICD-10-CM

## 2020-09-14 NOTE — Patient Instructions (Addendum)
Medication Instructions:   No changes   *If you need a refill on your cardiac medications before your next appointment, please call your pharmacy*   Lab Work: Not needed   Testing/Procedures: Not needed   Follow-Up: At [facility], you and your health needs are our priority.  As part of our continuing mission to provide you with exceptional heart care, we have created designated Provider Care Teams.  These Care Teams include your primary Cardiologist (physician) and Advanced Practice Providers (APPs -  Physician Assistants and Nurse Practitioners) who all work together to provide you with the care you need, when you need it.  We recommend signing up for the patient portal called "MyChart".  Sign up information is provided on this After Visit Summary.  MyChart is used to connect with patients for Virtual Visits (Telemedicine).  Patients are able to view lab/test results, encounter notes, upcoming appointments, etc.  Non-urgent messages can be sent to your provider as well.   To learn more about what you can do with MyChart, go to [email or web address].    Your next appointment:   12 month(s)  The format for your next appointment:   In Person  Provider:   David Harding, MD   Other Instructions  

## 2020-09-14 NOTE — Progress Notes (Signed)
Primary Care Provider: Deland Pretty, MD Cardiologist: Glenetta Hew, MD Electrophysiologist: None  Clinic Note: Chief Complaint  Patient presents with  . Follow-up    Stable exertional dyspnea.  Otherwise no issues.  . Aortic Stenosis    Moderate based on last echo.    HPI:    Raymond Cuevas is a 86 y.o. male with a PMH notable for COPD, Moderate AS, Morbid Obesity, HTN and HLD who presents today for 48-month follow-up.  I last saw him in November 2020.  He is doing relatively well.  No major changes.  We did discuss weaning off beta-blocker, but he never ended up doing that at that time.  Noted some mild left upper chest/breast area discomfort describes a pulling sensation.  Not associate with activity.  Able to walk on flat surface roughly 2-2.5 miles without issues.  However he gets short of breath if he goes up an incline.  Swelling well controlled on current dose of Lasix.Marland Kitchen  Described morning cough and mild swelling.  Myalgias noted.  Weaned off of beta-blocker and started losartan 25 mg.  Check 2D echo  Problem List Items Addressed This Visit    Morbid obesity ([place]) (Chronic)   Essential hypertension (Chronic)   Mild aortic stenosis by prior echocardiogram - Primary (Chronic)   Relevant Orders   EKG 12-Lead (Completed)   Dyspnea on exertion     Raymond Cuevas was last seen on Mar 10, 2020 by Oswald Hillock, NP.  No major cardiac complaints.  PCP apparently changed multiple medications.  No longer on furosemide 40 mg.  Prednisone dose reduced to 10 mg daily.  He continues to note exertional dyspnea at baseline.  Able to walk up and down is 150 foot driveway 4 times for exercise.  Occasionally has to stop to catch his breath.  Able to clean the house and do a all ADLs.  He takes his time mostly because of age and somewhat unsteady gait.  He noted his energy level did improve having stopped beta-blocker.  No rapid heart rate or palpitations.  No evidence of volume  overload.  Recent Hospitalizations: None  Reviewed  CV studies:    The following studies were reviewed today: (if available, images/films reviewed: From Epic Chart or Care Everywhere) . TTE (12/19/2019): Normal LV size, function with no R WMA.  Moderate concentric LVH.  EF 55-60%.  GR 1 DD.  Normal RV size and function.  Moderate aortic valve stenosis (mean gradient 21 mmHg).   Interval History:   Raymond Cuevas returns here today for 69-month follow-up doing well.  No major issues.  He is stable.  He does say that his energy level is improved being off beta-blocker.  Still does not have great energy, but is able to do a lot more.  Less dyspneic on exertion.  Still able to do 4 laps back-and-forth up and down his driveway most days without having to stop.  He just takes his time to walk slowly as long as he does that, does fine.  Has not had any chest pain or pressure with rest or exertion.  He does not have resting dyspnea.  No PND orthopnea.  Edema seems to be well controlled without being on diuretic.  CV Review of Symptoms (Summary): positive for - Stable exertional dyspnea with minimal edema. negative for - chest pain, irregular heartbeat, orthopnea, paroxysmal nocturnal dyspnea, rapid heart rate, shortness of breath or Syncope/near syncope, TIA/amaurosis fugax, claudication  The patient does not have  symptoms concerning for COVID-19 infection (fever, chills, cough, or new shortness of breath).   REVIEWED OF SYSTEMS   Review of Systems  Constitutional: Negative for malaise/fatigue (Energy level has improved since stopping beta-blocker.) and weight loss (He actually has gained weight since last visit.-He thinks this is related to Thanksgiving.).  HENT: Negative for congestion and nosebleeds.   Respiratory: Positive for shortness of breath (Baseline). Negative for cough.   Gastrointestinal: Negative for abdominal pain, blood in stool and melena.  Genitourinary: Negative for hematuria.   Musculoskeletal: Positive for joint pain (Nonlimiting arthritis). Negative for myalgias.  Neurological: Negative for dizziness, focal weakness and headaches.  Psychiatric/Behavioral: Negative for depression and memory loss. The patient does not have insomnia.     I have reviewed and (if needed) personally updated the patient's problem list, medications, allergies, past medical and surgical history, social and family history.   PAST MEDICAL HISTORY   Past Medical History:  Diagnosis Date  . Arthritis    osteoarthritis  . COPD (chronic obstructive pulmonary disease) ([place])   . GERD (gastroesophageal reflux disease)    occ.  Marland Kitchen Hyperlipidemia LDL goal <100 09/25/2020  . Hypertension   . Moderate aortic stenosis by prior echocardiogram 09/06/2018   TTE (12/19/2019): Normal LV size, function with no R WMA.  Moderate concentric LVH.  EF 55-60%.  GR 1 DD.  Normal RV size and function.  Moderate aortic valve stenosis (mean gradient 21 mmHg).  . Osteoarthritis   . S/P right THA, AA 08/27/2013    PAST SURGICAL HISTORY   Past Surgical History:  Procedure Laterality Date  . CATARACT EXTRACTION, BILATERAL Bilateral   . CHOLECYSTECTOMY     laparoscopic  . EYE SURGERY Left    hole repair  . JOINT REPLACEMENT Right    Knee  . NM MYOVIEW LTD  11/2016    normal EF (55 to 65%).  No ischemia or infarction.  LOW RISK.  Marland Kitchen REPLACEMENT TOTAL KNEE    . TONSILLECTOMY    . TOTAL HIP ARTHROPLASTY Right 08/27/2013   Procedure: RIGHT TOTAL HIP ARTHROPLASTY ANTERIOR APPROACH;  Surgeon: Mauri Pole, MD;  Location: [facility];  Service: Orthopedics;  Laterality: Right;  . TRANSTHORACIC ECHOCARDIOGRAM  12/2017   Mild LVH.  Normal EF 55-60%.  GR 1 DD.  No RWMA.  Mild AS (mean gradient 21 mm)  . TRANSTHORACIC ECHOCARDIOGRAM  12/2019   Normal LV size, function with no R WMA.  Moderate concentric LVH.  EF 55-60%.  GR 1 DD.  Normal RV size and function.  Moderate aortic valve stenosis (mean gradient 21 mmHg).     Immunization History  Administered Date(s) Administered  . Influenza, High Dose Seasonal PF 07/31/2017  . Influenza,inj,quad, With Preservative 07/17/2017, 07/01/2019  . Influenza-Unspecified 08/17/2018  . PFIZER SARS-COV-2 Vaccination 01/23/2020, 02/17/2020, 09/08/2020  . Pneumococcal Conjugate-13 11/25/2013  . Pneumococcal Polysaccharide-23 12/23/2001    MEDICATIONS/ALLERGIES   Current Meds  Medication Sig  . albuterol (VENTOLIN HFA) 108 (90 Base) MCG/ACT inhaler INHALE 2 PUFFS BY MOUTH AS NEEDED 4 TIMES DAILY UNTIL WHEEZING EXTINGUISHED. RINSE AND SPIT AFTER USE. DEMONSTRATE PROPER TECHNIQUE INHALATI  . aspirin EC 81 MG tablet Take 81 mg by mouth daily.  . budesonide-formoterol (SYMBICORT) 160-4.5 MCG/ACT inhaler Inhale 2 puffs into the lungs in the morning and at bedtime.  . calcium-vitamin D (OSCAL WITH D) 500-200 MG-UNIT per tablet Take 1 tablet by mouth.  . ferrous sulfate 325 (65 FE) MG tablet Take 1 tablet (325 mg total) by mouth 3 (  three) times daily after meals.  . fish oil-omega-3 fatty acids 1000 MG capsule Take 1 g by mouth daily.  Marland Kitchen HYDROcodone-acetaminophen (NORCO) 5-325 MG tablet Take 1-2 tablets by mouth every 6 (six) hours as needed.  Marland Kitchen losartan (COZAAR) 25 MG tablet Take 1 tablet (25 mg total) by mouth daily.  . magnesium hydroxide (MILK OF MAGNESIA) 400 MG/5ML suspension Take 30 mLs by mouth at bedtime.   . predniSONE (DELTASONE) 10 MG tablet Take 10 mg by mouth daily with breakfast.    Allergies  Allergen Reactions  . Codeine Other (See Comments)    "Walking the walls"  . Morphine And Related Other (See Comments)    "Walking the walls"  . Sulfa Antibiotics Rash    SOCIAL HISTORY/FAMILY HISTORY   Reviewed in Epic:  Pertinent findings: No new changes.  OBJCTIVE -PE, EKG, labs   Wt Readings from Last 3 Encounters:  09/14/20 292 lb 12.8 oz (132.8 kg)  06/30/20 286 lb 6.4 oz (129.9 kg)  05/06/20 285 lb 6.4 oz (129.5 kg)    Physical Exam: BP  127/64   Pulse 65   Ht 6\' 2"  (1.88 m)   Wt 292 lb 12.8 oz (132.8 kg)   SpO2 96%   BMI 37.59 kg/m  Physical Exam Vitals reviewed.  Constitutional:      General: He is not in acute distress.    Appearance: Normal appearance. He is obese. He is not ill-appearing or toxic-appearing.  Neck:     Vascular: No carotid bruit, hepatojugular reflux or JVD.  Cardiovascular:     Rate and Rhythm: Normal rate and regular rhythm. Occasional extrasystoles are present.    Chest Wall: PMI is not displaced.     Pulses: Decreased pulses (Decreased with palpable pedal pulses.).     Heart sounds: S1 normal and S2 normal. Heart sounds are distant. Murmur (2-3/6 SEM (C-D)at RUSB--neck)) heard.  No friction rub. No gallop.   Pulmonary:     Effort: Pulmonary effort is normal. No respiratory distress.     Breath sounds: Normal breath sounds.  Chest:     Chest wall: No tenderness.  Abdominal:     General: Bowel sounds are normal. There is no distension.     Palpations: Abdomen is soft. There is no mass.  Musculoskeletal:        General: No swelling (Minimal trace to 1+ bilateral). Normal range of motion.     Cervical back: Normal range of motion and neck supple.  Neurological:     General: No focal deficit present.     Mental Status: He is alert and oriented to person, place, and time.  Psychiatric:        Mood and Affect: Mood normal.        Behavior: Behavior normal.        Thought Content: Thought content normal.        Judgment: Judgment normal.     Comments: Somewhat stoic      Adult ECG Report  Rate: 65 ;  Rhythm: normal sinus rhythm and Normal axis, intervals and durations.;   Narrative Interpretation: Normal EKG normal EKG  Recent Labs: June 25, 2020: LDL 90, HDL 36, TG 223.  TC not reported. No results found for: CHOL, HDL, LDLCALC, LDLDIRECT, TRIG, CHOLHDL Lab Results  Component Value Date   CREATININE 0.90 09/12/2016   BUN 10 09/12/2016   NA 142 09/12/2016   K 3.9 09/12/2016    CL 108 09/12/2016   CO2 26 08/29/2013  No results found for: TSH  ASSESSMENT/PLAN    Problem List Items Addressed This Visit    Morbid obesity ([place]) (Chronic)    Continue to stress diet changes as he is not able to exercise on that much.      Essential hypertension (Chronic)    Blood pressure doing much better on losartan. Notable improvement in energy level having stopped beta-blocker.      Moderate aortic stenosis by prior echocardiogram - Primary (Chronic)    Interestingly, currently read as moderate aortic stenosis but the mean gradient has not changed.  This is most likely indicative of change in morphology as opposed to gradients.  Continue to follow-up every 2 to 3 years.      Relevant Orders   EKG 12-Lead (Completed)   Dyspnea on exertion (Chronic)    Stable.  He is doing better with his exercise.  Energy levels improved since stopping beta-blocker. I do not think that aortic stenosis is affecting his dyspnea yet.  Diastolic function also seems pretty stable.  ARB is working well for afterload reduction.      Hyperlipidemia LDL goal <100 (Chronic)    Last lipids looked relatively well controlled for his age and risk factors.  He is not on anything besides omega-3 fatty acids.   At this point, we will continue off of statin.          COVID-19 Education: The signs and symptoms of COVID-19 were discussed with the patient and how to seek care for testing (follow up with PCP or arrange E-visit).   The importance of social distancing and COVID-19 vaccination was discussed today. 1 min The patient is practicing social distancing & Masking.   I spent a total of 53minutes with the patient spent in direct patient consultation.  Additional time spent with chart review  / charting (studies, outside notes, etc): 6 Total Time: 21 min   Current medicines are reviewed at length with the patient today.  (+/- concerns) n/a  This visit occurred during the SARS-CoV-2 public  health emergency.  Safety protocols were in place, including screening questions prior to the visit, additional usage of staff PPE, and extensive cleaning of exam room while observing appropriate contact time as indicated for disinfecting solutions.  Notice: This dictation was prepared with Dragon dictation along with smaller phrase technology. Any transcriptional errors that result from this process are unintentional and may not be corrected upon review.  Patient Instructions / Medication Changes & Studies & Tests Ordered   Patient Instructions  Medication Instructions:  No changes    *If you need a refill on your cardiac medications before your next appointment, please call your pharmacy*   Lab Work:  Not needed     Testing/Procedures:  Not needed  Follow-Up: At [facility], you and your health needs are our priority.  As part of our continuing mission to provide you with exceptional heart care, we have created designated Provider Care Teams.  These Care Teams include your primary Cardiologist (physician) and Advanced Practice Providers (APPs -  Physician Assistants and Nurse Practitioners) who all work together to provide you with the care you need, when you need it.  We recommend signing up for the patient portal called "MyChart".  Sign up information is provided on this After Visit Summary.  MyChart is used to connect with patients for Virtual Visits (Telemedicine).  Patients are able to view lab/test results, encounter notes, upcoming appointments, etc.  Non-urgent messages can be sent to your provider  as well.   To learn more about what you can do with MyChart, go to [email or web address].    Your next appointment:   12 month(s)  The format for your next appointment:   In Person  Provider:   Glenetta Hew, MD   Other Instructions      Studies Ordered:   Orders Placed This Encounter  Procedures  . EKG 12-Lead     Glenetta Hew, M.D.,  M.S. Interventional Cardiologist   Pager # [phone] Phone # [phone] [address]. [place], [place] [address]   Thank you for choosing [facility] at [facility]!!

## 2020-09-25 ENCOUNTER — Encounter: Payer: Self-pay | Admitting: Cardiology

## 2020-09-25 DIAGNOSIS — E785 Hyperlipidemia, unspecified: Secondary | ICD-10-CM

## 2020-09-25 HISTORY — DX: Hyperlipidemia, unspecified: E78.5

## 2020-09-25 NOTE — Assessment & Plan Note (Addendum)
Interestingly, currently read as moderate aortic stenosis but the mean gradient has not changed.  This is most likely indicative of change in morphology as opposed to gradients.  Continue to follow-up every 2 to 3 years.

## 2020-09-25 NOTE — Assessment & Plan Note (Signed)
Continue to stress diet changes as he is not able to exercise on that much.

## 2020-09-25 NOTE — Assessment & Plan Note (Addendum)
Last lipids looked relatively well controlled for his age and risk factors.  He is not on anything besides omega-3 fatty acids.   At this point, we will continue off of statin.

## 2020-09-25 NOTE — Assessment & Plan Note (Addendum)
Blood pressure doing much better on losartan. Notable improvement in energy level having stopped beta-blocker.

## 2020-09-25 NOTE — Assessment & Plan Note (Signed)
Stable.  He is doing better with his exercise.  Energy levels improved since stopping beta-blocker. I do not think that aortic stenosis is affecting his dyspnea yet.  Diastolic function also seems pretty stable.  ARB is working well for afterload reduction.

## 2020-10-02 ENCOUNTER — Other Ambulatory Visit: Payer: Self-pay

## 2020-10-02 ENCOUNTER — Emergency Department (HOSPITAL_COMMUNITY)
Admission: EM | Admit: 2020-10-02 | Discharge: 2020-10-02 | Disposition: A | Discharge status: Home / Self Care | Payer: Medicare Other | Attending: Emergency Medicine | Admitting: Emergency Medicine

## 2020-10-02 DIAGNOSIS — Z5321 Procedure and treatment not carried out due to patient leaving prior to being seen by health care provider: Secondary | ICD-10-CM | POA: Insufficient documentation

## 2020-10-02 DIAGNOSIS — L02212 Cutaneous abscess of back [any part, except buttock]: Secondary | ICD-10-CM | POA: Insufficient documentation

## 2020-10-02 NOTE — ED Triage Notes (Signed)
Pt stated he was here 10 days ago and had an abscess lanced. Pt c/o pain to site, left upper back behind shoulder.

## 2020-10-06 ENCOUNTER — Other Ambulatory Visit: Payer: Self-pay

## 2020-10-06 ENCOUNTER — Encounter (HOSPITAL_COMMUNITY): Payer: Self-pay

## 2020-10-06 ENCOUNTER — Emergency Department (HOSPITAL_COMMUNITY)
Admission: EM | Admit: 2020-10-06 | Discharge: 2020-10-06 | Disposition: A | Discharge status: Home / Self Care | Payer: Medicare Other | Attending: Emergency Medicine | Admitting: Emergency Medicine

## 2020-10-06 DIAGNOSIS — J449 Chronic obstructive pulmonary disease, unspecified: Secondary | ICD-10-CM | POA: Insufficient documentation

## 2020-10-06 DIAGNOSIS — Z96651 Presence of right artificial knee joint: Secondary | ICD-10-CM | POA: Insufficient documentation

## 2020-10-06 DIAGNOSIS — M546 Pain in thoracic spine: Secondary | ICD-10-CM | POA: Diagnosis not present

## 2020-10-06 DIAGNOSIS — Z7951 Long term (current) use of inhaled steroids: Secondary | ICD-10-CM | POA: Diagnosis not present

## 2020-10-06 DIAGNOSIS — Z4801 Encounter for change or removal of surgical wound dressing: Secondary | ICD-10-CM | POA: Diagnosis not present

## 2020-10-06 DIAGNOSIS — Z7982 Long term (current) use of aspirin: Secondary | ICD-10-CM | POA: Insufficient documentation

## 2020-10-06 DIAGNOSIS — I10 Essential (primary) hypertension: Secondary | ICD-10-CM | POA: Diagnosis not present

## 2020-10-06 DIAGNOSIS — Z5189 Encounter for other specified aftercare: Secondary | ICD-10-CM

## 2020-10-06 MED ORDER — HYDROCODONE-ACETAMINOPHEN 5-325 MG PO TABS: 1.0000 | ORAL_TABLET | Freq: Four times a day (QID) | ORAL | 0 refills | Status: DC | PRN

## 2020-10-06 MED ORDER — LIDOCAINE-PRILOCAINE 2.5-2.5 % EX CREA
TOPICAL_CREAM | Freq: Once | CUTANEOUS | Status: AC
  Administered 2020-10-06: 1 via TOPICAL
  Filled 2020-10-06: qty 5

## 2020-10-06 MED ORDER — LIDOCAINE-PRILOCAINE 2.5-2.5 % EX CREA: 1.0000 "application " | TOPICAL_CREAM | CUTANEOUS | 0 refills | Status: DC | PRN

## 2020-10-06 NOTE — ED Provider Notes (Signed)
[facility] DEPT Provider Note   CSN: [id number] Arrival date & time: 10/06/20  1207     History Chief Complaint  Patient presents with  . Wound Check    Raymond Cuevas is a 86 y.o. male with PMH/o COPD, GERD, HLD HTN who presents for evaluation of pain secondary to a growth that has been on his left upper back. He states he first noticed the area about 2 months ago. He was seen in the ED and states that he was discharged home. He states it continued to cause irritation and started to come to ahead few weeks ago so he went to an urgent care clinic. He states that they put a needle in it and drained it. He states that since then, the area has gotten larger and worse. He states that is caused him pain. He states he has not noticed any more drainage. He denies any fevers.  The history is provided by the patient.       Past Medical History:  Diagnosis Date  . Arthritis    osteoarthritis  . COPD (chronic obstructive pulmonary disease) ([place])   . GERD (gastroesophageal reflux disease)    occ.  Marland Kitchen Hyperlipidemia LDL goal <100 09/25/2020  . Hypertension   . Moderate aortic stenosis by prior echocardiogram 09/06/2018   TTE (12/19/2019): Normal LV size, function with no R WMA.  Moderate concentric LVH.  EF 55-60%.  GR 1 DD.  Normal RV size and function.  Moderate aortic valve stenosis (mean gradient 21 mmHg).  . Osteoarthritis   . 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 Hyperlipidemia LDL goal <100 09/25/2020  . Moderate aortic stenosis by prior echocardiogram 09/06/2018  . Dyspnea on exertion 01/21/2017  . Essential hypertension 01/21/2017  . Morbid obesity ([place]) 08/28/2013  . 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 CATARACT EXTRACTION, BILATERAL Bilateral   . CHOLECYSTECTOMY     laparoscopic  . EYE SURGERY Left    hole repair  . JOINT REPLACEMENT Right     Knee  . NM MYOVIEW LTD  11/2016    normal EF (55 to 65%).  No ischemia or infarction.  LOW RISK.  Marland Kitchen REPLACEMENT TOTAL KNEE    . TONSILLECTOMY    . TOTAL HIP ARTHROPLASTY Right 08/27/2013   Procedure: RIGHT TOTAL HIP ARTHROPLASTY ANTERIOR APPROACH;  Surgeon: Mauri Pole, MD;  Location: [facility] ORS;  Service: Orthopedics;  Laterality: Right;  . TRANSTHORACIC ECHOCARDIOGRAM  12/2017   Mild LVH.  Normal EF 55-60%.  GR 1 DD.  No RWMA.  Mild AS (mean gradient 21 mm)  . TRANSTHORACIC ECHOCARDIOGRAM  12/2019   Normal LV size, function with no R WMA.  Moderate concentric LVH.  EF 55-60%.  GR 1 DD.  Normal RV size and function.  Moderate aortic valve stenosis (mean gradient 21 mmHg).       Family History  Problem Relation Age of Onset  . Sudden death Sister 3    Social History   Tobacco Use  . Smoking status: Never Smoker  . Smokeless tobacco: Never Used  Vaping Use  . Vaping Use: Never used  Substance Use Topics  . Alcohol use: Yes    Alcohol/week: 1.0 standard drink    Types: 1 Cans of beer per week    Comment: rarely  . Drug use: No    Home Medications  Prior to Admission medications   Medication Sig Start Date End Date Taking? Authorizing Provider  albuterol (VENTOLIN HFA) 108 (90 Base) MCG/ACT inhaler INHALE 2 PUFFS BY MOUTH AS NEEDED 4 TIMES DAILY UNTIL WHEEZING EXTINGUISHED. RINSE AND SPIT AFTER USE. DEMONSTRATE PROPER TECHNIQUE INHALATI 01/23/19   [provider]  aspirin EC 81 MG tablet Take 81 mg by mouth daily.    [provider]  budesonide-formoterol (SYMBICORT) 160-4.5 MCG/ACT inhaler Inhale 2 puffs into the lungs in the morning and at bedtime. 05/06/20   Mannam, Hart Robinsons, MD  calcium-vitamin D (OSCAL WITH D) 500-200 MG-UNIT per tablet Take 1 tablet by mouth.    [provider]  ferrous sulfate 325 (65 FE) MG tablet Take 1 tablet (325 mg total) by mouth 3 (three) times daily after meals. 08/29/13   Danae Orleans, PA-C  fish oil-omega-3 fatty  acids 1000 MG capsule Take 1 g by mouth daily.    [provider]  HYDROcodone-acetaminophen (NORCO/VICODIN) 5-325 MG tablet Take 1-2 tablets by mouth every 6 (six) hours as needed. 10/06/20   Volanda Napoleon, PA-C  lidocaine-prilocaine (EMLA) cream Apply 1 application topically as needed. 10/06/20   Volanda Napoleon, PA-C  losartan (COZAAR) 25 MG tablet Take 1 tablet (25 mg total) by mouth daily. 09/10/19 09/14/20  Leonie Man, MD  magnesium hydroxide (MILK OF MAGNESIA) 400 MG/5ML suspension Take 30 mLs by mouth at bedtime.     [provider]  predniSONE (DELTASONE) 10 MG tablet Take 10 mg by mouth daily with breakfast.    [provider]    Allergies    Codeine, Morphine and related, and Sulfa antibiotics  Review of Systems   Review of Systems  Constitutional: Negative for fever.  Skin: Positive for wound. Negative for color change.  All other systems reviewed and are negative.   Physical Exam Updated Vital Signs BP (!) 139/96 (BP Location: Right Arm)   Pulse 85   Temp 97.6 F (36.4 C) (Oral)   Resp 15   SpO2 97%   Physical Exam Vitals and nursing note reviewed.  Constitutional:      Appearance: He is well-developed and well-nourished.  HENT:     Head: Normocephalic and atraumatic.  Eyes:     General: No scleral icterus.       Right eye: No discharge.        Left eye: No discharge.     Extraocular Movements: EOM normal.     Conjunctiva/sclera: Conjunctivae normal.  Pulmonary:     Effort: Pulmonary effort is normal.  Skin:    General: Skin is warm and dry.          Comments: He has a dime sized area of growth on the left upper back that has some surrounding dry scaly skin. There is some telangiectasia on the actual growth itself. No surrounding warmth, erythema. No purulent drainage.  Neurological:     Mental Status: He is alert.  Psychiatric:        Mood and Affect: Mood and affect normal.        Speech: Speech normal.         Behavior: Behavior normal.     ED Results / Procedures / Treatments   Labs (all labs ordered are listed, but only abnormal results are displayed) Labs Reviewed - No data to display  EKG None  Radiology No results found.  Procedures Procedures (including critical care time)  Medications Ordered in ED Medications  lidocaine-prilocaine (EMLA) cream (1  application Topical Given 10/06/20 1435)    ED Course  I have reviewed the triage vital signs and the nursing notes.  Pertinent labs & imaging results that were available during my care of the patient were reviewed by me and considered in my medical decision making (see chart for details).    MDM Rules/Calculators/A&P                          86 year old male who presents for evaluation of wound to his left upper back that is been ongoing for a few months. He reports that about 3 weeks ago, he had a needle aspiration done in urgent care clinic. He states that since then, the areas become more painful and irritated. He states it is very sensitive. No surrounding warmth, erythema, drainage, fevers. On initial arrival, he is afebrile, toxic appearing. Vital signs are stable. On exam, he has a dime sized growth noted to the left upper back with some surrounding dry scaly skin. There are some telangiectasia noted. There is no surrounding warmth, erythema. No purulent drainage. Exam is not concerning for sebaceous cyst, abscess. Concern for potential growth versus skin cancer. Patient is requesting that we do an incision and drainage. I discussed with patient that at this time, given the exam, incision and drainage would not be indicated given that there is no concern for abscess. We will plan to give him some topical pain medication as well as some p.o. pain medication.  I discussed with Dr. Shelia Media (Patient's PCP) regarding findings on exam today in the emergency department. He will coordinate with the patient to establish a dermatology  referral so that this area can be excised and biopsied.  Updated patient on plan. We will send him home with topical and p.o. pain medication. At this time, patient exhibits no emergent life-threatening condition that require further evaluation in ED. Patient had ample opportunity for questions and discussion. All patient's questions were answered with full understanding. Strict return precautions discussed. Patient expresses understanding and agreement to plan.   Portions of this note were generated with [employer]. Dictation errors may occur despite best attempts at proofreading.  Final Clinical Impression(s) / ED Diagnoses Final diagnoses:  Visit for wound check    Rx / DC Orders ED Discharge Orders         Ordered    lidocaine-prilocaine (EMLA) cream  As needed        10/06/20 1424    HYDROcodone-acetaminophen (NORCO/VICODIN) 5-325 MG tablet  Every 6 hours PRN        10/06/20 1424           Desma Mcgregor 10/06/20 1438    Blanchie Dessert, MD 10/06/20 1518

## 2020-10-06 NOTE — Discharge Instructions (Signed)
I have contacted your primary care doctor they will work with you to arrange a dermatology referral.  In the meantime, you can use the topical medication as well as the pain medication to help with pain.  Return to the emergency department for any fever, drainage from the wound.

## 2020-10-06 NOTE — ED Triage Notes (Signed)
Pt reports he came about 10 days ago to have a wound checked on his back, but left due to wait times. Pt have raised wound to left upper back.

## 2020-10-06 NOTE — ED Notes (Signed)
Discharge paperwork reviewed with pt, including prescription.  Pt verbalized understanding, ambulatory at time of discharge.

## 2020-10-14 DIAGNOSIS — D045 Carcinoma in situ of skin of trunk: Secondary | ICD-10-CM | POA: Diagnosis not present

## 2020-10-21 DIAGNOSIS — C44629 Squamous cell carcinoma of skin of left upper limb, including shoulder: Secondary | ICD-10-CM | POA: Diagnosis not present

## 2020-12-09 DIAGNOSIS — Z85828 Personal history of other malignant neoplasm of skin: Secondary | ICD-10-CM | POA: Diagnosis not present

## 2020-12-09 DIAGNOSIS — Z08 Encounter for follow-up examination after completed treatment for malignant neoplasm: Secondary | ICD-10-CM | POA: Diagnosis not present

## 2021-02-10 DIAGNOSIS — Z85828 Personal history of other malignant neoplasm of skin: Secondary | ICD-10-CM | POA: Diagnosis not present

## 2021-02-10 DIAGNOSIS — L570 Actinic keratosis: Secondary | ICD-10-CM | POA: Diagnosis not present

## 2021-02-10 DIAGNOSIS — Z08 Encounter for follow-up examination after completed treatment for malignant neoplasm: Secondary | ICD-10-CM | POA: Diagnosis not present

## 2021-02-10 DIAGNOSIS — X32XXXD Exposure to sunlight, subsequent encounter: Secondary | ICD-10-CM | POA: Diagnosis not present

## 2021-07-02 DIAGNOSIS — R0989 Other specified symptoms and signs involving the circulatory and respiratory systems: Secondary | ICD-10-CM | POA: Diagnosis not present

## 2021-07-02 DIAGNOSIS — E782 Mixed hyperlipidemia: Secondary | ICD-10-CM | POA: Diagnosis not present

## 2021-07-02 DIAGNOSIS — I2584 Coronary atherosclerosis due to calcified coronary lesion: Secondary | ICD-10-CM | POA: Diagnosis not present

## 2021-07-02 DIAGNOSIS — R918 Other nonspecific abnormal finding of lung field: Secondary | ICD-10-CM | POA: Diagnosis not present

## 2021-07-02 DIAGNOSIS — D692 Other nonthrombocytopenic purpura: Secondary | ICD-10-CM | POA: Diagnosis not present

## 2021-07-02 DIAGNOSIS — I10 Essential (primary) hypertension: Secondary | ICD-10-CM | POA: Diagnosis not present

## 2021-07-02 DIAGNOSIS — R413 Other amnesia: Secondary | ICD-10-CM | POA: Diagnosis not present

## 2021-07-02 DIAGNOSIS — R5383 Other fatigue: Secondary | ICD-10-CM | POA: Diagnosis not present

## 2021-07-02 DIAGNOSIS — R0609 Other forms of dyspnea: Secondary | ICD-10-CM | POA: Diagnosis not present

## 2021-07-06 DIAGNOSIS — E782 Mixed hyperlipidemia: Secondary | ICD-10-CM | POA: Diagnosis not present

## 2021-07-06 DIAGNOSIS — R0609 Other forms of dyspnea: Secondary | ICD-10-CM | POA: Diagnosis not present

## 2021-07-06 DIAGNOSIS — I10 Essential (primary) hypertension: Secondary | ICD-10-CM | POA: Diagnosis not present

## 2021-07-09 ENCOUNTER — Encounter: Payer: Self-pay | Admitting: Cardiology

## 2021-07-09 ENCOUNTER — Other Ambulatory Visit: Payer: Self-pay

## 2021-07-09 ENCOUNTER — Ambulatory Visit (INDEPENDENT_AMBULATORY_CARE_PROVIDER_SITE_OTHER): Payer: Medicare Other | Admitting: Cardiology

## 2021-07-09 VITALS — BP 130/70 | HR 69 | Ht 74.0 in | Wt 297.8 lb

## 2021-07-09 DIAGNOSIS — I350 Nonrheumatic aortic (valve) stenosis: Secondary | ICD-10-CM

## 2021-07-09 DIAGNOSIS — I10 Essential (primary) hypertension: Secondary | ICD-10-CM | POA: Diagnosis not present

## 2021-07-09 DIAGNOSIS — R0609 Other forms of dyspnea: Secondary | ICD-10-CM

## 2021-07-09 DIAGNOSIS — R0600 Dyspnea, unspecified: Secondary | ICD-10-CM | POA: Diagnosis not present

## 2021-07-09 DIAGNOSIS — E785 Hyperlipidemia, unspecified: Secondary | ICD-10-CM | POA: Diagnosis not present

## 2021-07-09 NOTE — Patient Instructions (Signed)
Medication Instructions:   No changes *If you need a refill on your cardiac medications before your next appointment, please call your pharmacy*   Lab Work: Not needed   Testing/Procedures: Will be schedule at [facility] has requested that you have an echocardiogram. Echocardiography is a painless test that uses sound waves to create images of your heart. It provides your doctor with information about the size and shape of your heart and how well your heart's chambers and valves are working. This procedure takes approximately one hour. There are no restrictions for this procedure.   Follow-Up: At [facility], you and your health needs are our priority.  As part of our continuing mission to provide you with exceptional heart care, we have created designated Provider Care Teams.  These Care Teams include your primary Cardiologist (physician) and Advanced Practice Providers (APPs -  Physician Assistants and Nurse Practitioners) who all work together to provide you with the care you need, when you need it.  We recommend signing up for the patient portal called "MyChart".  Sign up information is provided on this After Visit Summary.  MyChart is used to connect with patients for Virtual Visits (Telemedicine).  Patients are able to view lab/test results, encounter notes, upcoming appointments, etc.  Non-urgent messages can be sent to your provider as well.   To learn more about what you can do with MyChart, go to [email or web address].    Your next appointment:   8 month(s) --May 2023  The format for your next appointment:   In Person  Provider:   Glenetta Hew, MD   Other Instructions Keep appointment with Thomasene Mohair in Nov 2022

## 2021-07-09 NOTE — Progress Notes (Signed)
Primary Care Provider: Deland Pretty, MD Cardiologist: Glenetta Hew, MD Electrophysiologist: None  Clinic Note: Chief Complaint  Patient presents with   Follow-up    Shortness of breath, murmur, edema   Aortic Stenosis    Louder murmur   Shortness of Breath    More prominent exertional dyspnea.    ===================================  ASSESSMENT/PLAN   Problem List Items Addressed This Visit       Cardiology Problems   Essential hypertension (Chronic)    Stable blood pressure being off beta-blocker.  Remains on losartan.  Not on diuretic.      Moderate aortic stenosis by prior echocardiogram - Primary (Chronic)    Borderline criteria for moderate aortic stenosis by last echocardiogram.  I do think that the murmur seems maybe a little louder than I previously described in my last exam.  States he does seem to have more exercise intolerance and worsening dyspnea on exertion, we will move up his follow-up echocardiogram to now (and cancel his March echo).  Does not have parvus at tardus on exam although difficult to assess.  It would be unlikely that his valve is progressed to severe aortic stenosis, but definitely we need to evaluate.  If it is worse, we will have him return sooner to discuss further plans.      Relevant Orders   EKG 12-Lead   ECHOCARDIOGRAM COMPLETE   Hyperlipidemia LDL goal <100 (Chronic)    His home labs look outstanding.  He does not really even take his fish oil.  Not currently on any active medications.        Other   Morbid obesity ([place]) (Chronic)    He is gaining weight.  I think this is because of being sedentary.  We talked about dietary adjustments, provided some recommendations.  A lot of this is related to deconditioning and lack of exercise.  Stressed the importance of maintaining his walking.      Dyspnea on exertion (Chronic)    He does seem to have some progression of his exertional dyspnea but I suspect a lot of this is related to  deconditioning and almost anhedonia.  We will recheck a 2D echocardiogram to ensure the valve is stable.  He does not have any PND orthopnea to suspect CHF as a component. Edema does not seem to be all that significant, and I would prefer to avoid diuretic, would prefer to use foot elevation.  We talked about ischemic evaluation and other studies besides echocardiogram.  At this point, he does not feel that his symptoms have progressed to the point that there is much change compared to last year.  As such, we will hold off on further evaluation beyond echocardiogram.      Relevant Orders   EKG 12-Lead    ===================================  HPI:    Raymond Cuevas is a 87 y.o. male with a PMH for moderate AS, morbid obesity, HTN, HLD and COPD who presents today for 78-month follow-up at the request of Dr. Betsey Holiday originally scheduled to follow-up in December.Raymond Cuevas was last seen on September 14, 2020 as a 62-month follow-up with no major issues.  Energy level did improve being off beta-blocker.,  But not too much.  Was able to do maybe 4 laps back-and-forth up and down his driveway (182 feet) with less exertional dyspnea.  (As of May 2021 was able to do this 4 times, but occasionally him to stop to catch his breath).  Walks slow and deliberately  with unsteady gait-has to walk slowly.  (This was felt to be stable.Raymond Cuevas  No PND orthopnea.  Well-controlled edema on current diuretic. Plan was to follow-up echocardiogram in March 2023 to reassess aortic stenosis.  No medication changes.  Recent Hospitalizations: None  He was just seen by his PCP (Dr. Shelia Media) on 16 September-brought in by his daughter (who is now taking over his his primary care giver.  She was noticing worsening memory, and more signs of anhedonia, not going out as much.  Stopped fishing.  Stopped walking.  Getting short of breath at 10 to 15 feet.  Was felt to have a worsening systolic ejection murmur with some  mild bibasilar crackles.  Referred back to cardiology.  Reviewed  CV studies:    The following studies were reviewed today: (if available, images/films reviewed: From Epic Chart or Care Everywhere) Echocardiogram was pending for March 2023:   Interval History:   Raymond Cuevas presents here today accompanied by his daughter who seems to be taking charge as the caregiver.  She is taking a vested interest in all of his medical care at this point and describes several issues with skin cancer and other issues that are all trying to be resolved.  She is very concerned about his memory issues, seems to be forgetful, gets lost, forgets where he places things.,  However, he has great long-term memory.  She does note that he is having a little more swelling than usual, and seems to have a little bit more exertional dyspnea than he used to have.  He tells me that he is able to get out to his driveway and walk down the street little bit and come back but will have to stop a couple times.  This seems to represent a decline from last year, but he does not notice any precipitous changes, just been gradual.  He is bothered by his knee and back pain from arthritis which also makes it difficult to walk.  Although he has swelling, it seems that he is no longer on diuretic.  He also says that he has not been using his Symbicort very routinely which could explain some of his dyspnea.  CV Review of Symptoms (Summary) Cardiovascular ROS: positive for - dyspnea on exertion, edema, murmur, shortness of breath, and more more deconditioned, weight gain, fatigue/lethargy negative for - chest pain, irregular heartbeat, orthopnea, palpitations, paroxysmal nocturnal dyspnea, rapid heart rate, or lightheaded or dizzy, syncope/near syncope, TIA #versus progress, claudication  REVIEWED OF SYSTEMS   Review of Systems  Constitutional:  Positive for malaise/fatigue. Negative for weight loss (Gaining weight, partly because of  deconditioning, lack of mobility, and "grazing"-eating in between meals.).  HENT:  Positive for congestion and hearing loss (Hard of hearing). Negative for nosebleeds and sinus pain.   Respiratory:  Positive for shortness of breath (Mostly with exertion).   Cardiovascular:  Positive for leg swelling.  Gastrointestinal:  Negative for blood in stool and melena.  Genitourinary:  Negative for hematuria.  Musculoskeletal:  Positive for back pain and joint pain. Negative for falls.  Neurological:  Positive for dizziness and weakness (Generalized leg weakness). Negative for focal weakness and headaches.  Psychiatric/Behavioral:  Positive for memory loss. Negative for depression (Can tell, but seems to have clearly stages of anhedonia, no longer active, walking.  Not fishing.). The patient has insomnia. The patient is not nervous/anxious.    I have reviewed and (if needed) personally updated the patient's problem list, medications, allergies, past medical and surgical  history, social and family history.   PAST MEDICAL HISTORY   Past Medical History:  Diagnosis Date   Arthritis    osteoarthritis   COPD (chronic obstructive pulmonary disease) (HCC)    GERD (gastroesophageal reflux disease)    occ.   Hyperlipidemia LDL goal <100 09/25/2020   Hypertension    Moderate aortic stenosis by prior echocardiogram 09/06/2018   TTE (12/19/2019): Normal LV size, function with no R WMA.  Moderate concentric LVH.  EF 55-60%.  GR 1 DD.  Normal RV size and function.  Moderate aortic valve stenosis (mean gradient 21 mmHg).   Osteoarthritis    S/P right THA, AA 08/27/2013    PAST SURGICAL HISTORY   Past Surgical History:  Procedure Laterality Date   CATARACT EXTRACTION, BILATERAL Bilateral    CHOLECYSTECTOMY     laparoscopic   EYE SURGERY Left    hole repair   JOINT REPLACEMENT Right    Knee   NM MYOVIEW LTD  11/2016    normal EF (55 to 65%).  No ischemia or infarction.  LOW RISK.   REPLACEMENT TOTAL  KNEE     TONSILLECTOMY     TOTAL HIP ARTHROPLASTY Right 08/27/2013   Procedure: RIGHT TOTAL HIP ARTHROPLASTY ANTERIOR APPROACH;  Surgeon: Mauri Pole, MD;  Location: [facility];  Service: Orthopedics;  Laterality: Right;   TRANSTHORACIC ECHOCARDIOGRAM  12/2017   Mild LVH.  Normal EF 55-60%.  GR 1 DD.  No RWMA.  Mild AS (mean gradient 21 mm)   TRANSTHORACIC ECHOCARDIOGRAM  12/2019   Normal LV size, function with no R WMA.  Moderate concentric LVH.  EF 55-60%.  GR 1 DD.  Normal RV size and function.  Moderate aortic valve stenosis (mean gradient 21 mmHg).    Immunization History  Administered Date(s) Administered   Influenza, High Dose Seasonal PF 07/31/2017   Influenza,inj,quad, With Preservative 07/17/2017, 07/01/2019   Influenza-Unspecified 08/17/2018   PFIZER(Purple Top)SARS-COV-2 Vaccination 01/23/2020, 02/17/2020, 09/08/2020   Pneumococcal Conjugate-13 11/25/2013   Pneumococcal Polysaccharide-23 12/23/2001    MEDICATIONS/ALLERGIES   Current Meds  Medication Sig   albuterol (VENTOLIN HFA) 108 (90 Base) MCG/ACT inhaler INHALE 2 PUFFS BY MOUTH AS NEEDED 4 TIMES DAILY UNTIL WHEEZING EXTINGUISHED. RINSE AND SPIT AFTER USE. DEMONSTRATE PROPER TECHNIQUE INHALATI   aspirin EC 81 MG tablet Take 81 mg by mouth daily.   budesonide-formoterol (SYMBICORT) 160-4.5 MCG/ACT inhaler Inhale 2 puffs into the lungs in the morning and at bedtime.   calcium-vitamin D (OSCAL WITH D) 500-200 MG-UNIT per tablet Take 1 tablet by mouth.   ferrous sulfate 325 (65 FE) MG tablet Take 1 tablet (325 mg total) by mouth 3 (three) times daily after meals.   HYDROcodone-acetaminophen (NORCO/VICODIN) 5-325 MG tablet Take 1-2 tablets by mouth every 6 (six) hours as needed.   levothyroxine (SYNTHROID) 25 MCG tablet Take 25 mcg by mouth. Half a tab for 21 days then 1 tab everyday til blood work taking.   lidocaine-prilocaine (EMLA) cream Apply 1 application topically as needed.   magnesium hydroxide (MILK OF MAGNESIA)  400 MG/5ML suspension Take 30 mLs by mouth at bedtime.    Allergies  Allergen Reactions   Codeine Other (See Comments)    "Walking the walls"   Morphine And Related Other (See Comments)    "Walking the walls"   Sulfa Antibiotics Rash    SOCIAL HISTORY/FAMILY HISTORY   Reviewed in Epic:  Pertinent findings:  Social History   Tobacco Use   Smoking status: Never   Smokeless  tobacco: Never  Vaping Use   Vaping Use: Never used  Substance Use Topics   Alcohol use: Yes    Alcohol/week: 1.0 standard drink    Types: 1 Cans of beer per week    Comment: rarely   Drug use: No   Social History   Social History Narrative   Not on file    OBJCTIVE -PE, EKG, labs   Wt Readings from Last 3 Encounters:  07/09/21 297 lb 12.8 oz (135.1 kg)  09/14/20 292 lb 12.8 oz (132.8 kg)  06/30/20 286 lb 6.4 oz (129.9 kg)    Physical Exam: BP 130/70 (BP Location: Right Arm)   Pulse 69   Ht 6\' 2"  (1.88 m)   Wt 297 lb 12.8 oz (135.1 kg)   SpO2 94%   BMI 38.24 kg/m  Physical Exam Vitals reviewed.  Constitutional:      General: He is not in acute distress.    Appearance: He is obese. He is not toxic-appearing.     Comments: Does appear to be somewhat more disheveled, and less interactive today.  HENT:     Head: Normocephalic and atraumatic.  Neck:     Vascular: No carotid bruit (Radiated AoV murmur) or JVD.  Cardiovascular:     Rate and Rhythm: Normal rate and regular rhythm. No extrasystoles are present.    Chest Wall: PMI is not displaced.     Pulses: Decreased pulses (Mildly decreased because of edema.).     Heart sounds: S1 normal and S2 normal. Heart sounds are distant. Murmur heard.  Harsh crescendo-decrescendo midsystolic murmur is present with a grade of 3/6 at the upper right sternal border radiating to the neck. Unable to auscultate diastolic murmur.    No friction rub. No gallop.  Pulmonary:     Effort: Pulmonary effort is normal. No respiratory distress.     Breath  sounds: No wheezing, rhonchi or rales.     Comments: Bibasal crackles on initial auscultation, clears somewhat with cough. Chest:     Chest wall: No tenderness.  Abdominal:     Comments: Obese, soft/NT/ND/NABS.  Unable to assess HSM.  Musculoskeletal:        General: Swelling (Bilateral lower extremity roughly 1+ swelling.) present.     Cervical back: Normal range of motion and neck supple.  Skin:    General: Skin is warm and dry.  Neurological:     General: No focal deficit present.     Mental Status: He is alert and oriented to person, place, and time.     Comments: Very stoic.  Does not seem to answer many questions.  Deferring to his daughter.  Poor memory.  Psychiatric:     Comments: Stoic, flat affect.  Difficult to assess mood.    Adult ECG Report  Rate: 69-70;  Rhythm: normal sinus rhythm and normal axis, intervals durations ;   Narrative Interpretation: Normal EKG  Recent Labs:  07/02/2021 Na+ 144, K+ 4.2, Cl- 107, HCO3-29, BUN 16, Cr 1.26, Glu 99, Ca2+ 9.7; AST 34, ALT 22, AlkP 47; Alb 3.6. CBC: W 8.1, H/H 14.4/43.2, Plt 329; TSH 5.63 (H) TC 109, TG 158, HDL 42, LDL 35-excellent;    Chest x-ray 07/03/2019: Hyperinflation with mild right hemidiaphragm elevation-likely chronic.  No infiltrate, edema, mass or effusion.   ==================================================  COVID-19 Education: The signs and symptoms of COVID-19 were discussed with the patient and how to seek care for testing (follow up with PCP or arrange E-visit).    I spent  a total of 31 minutes with the patient spent in direct patient consultation.  Additional time spent with chart review  / charting (studies, outside notes, etc): 30  min Total Time: 61 min  Current medicines are reviewed at length with the patient today.  (+/- concerns) n/a  This visit occurred during the SARS-CoV-2 public health emergency.  Safety protocols were in place, including screening questions prior to the visit, additional  usage of staff PPE, and extensive cleaning of exam room while observing appropriate contact time as indicated for disinfecting solutions.  Notice: This dictation was prepared with Dragon dictation along with smaller phrase technology. Any transcriptional errors that result from this process are unintentional and may not be corrected upon review.  Patient Instructions / Medication Changes & Studies & Tests Ordered   Patient Instructions  Medication Instructions:   No changes *If you need a refill on your cardiac medications before your next appointment, please call your pharmacy*   Lab Work: Not needed   Testing/Procedures: Will be schedule at [place] has requested that you have an echocardiogram. Echocardiography is a painless test that uses sound waves to create images of your heart. It provides your doctor with information about the size and shape of your heart and how well your heart's chambers and valves are working. This procedure takes approximately one hour. There are no restrictions for this procedure.   Follow-Up: At [facility], you and your health needs are our priority.  As part of our continuing mission to provide you with exceptional heart care, we have created designated Provider Care Teams.  These Care Teams include your primary Cardiologist (physician) and Advanced Practice Providers (APPs -  Physician Assistants and Nurse Practitioners) who all work together to provide you with the care you need, when you need it.  We recommend signing up for the patient portal called "MyChart".  Sign up information is provided on this After Visit Summary.  MyChart is used to connect with patients for Virtual Visits (Telemedicine).  Patients are able to view lab/test results, encounter notes, upcoming appointments, etc.  Non-urgent messages can be sent to your provider as well.   To learn more about what you can do with MyChart, go to  [email or web address].    Your next appointment:   8 month(s) --May 2023  The format for your next appointment:   In Person  Provider:   Glenetta Hew, MD   Other Instructions Keep appointment with Thomasene Mohair in Nov 2022    Studies Ordered:   Orders Placed This Encounter  Procedures   EKG 12-Lead   ECHOCARDIOGRAM COMPLETE      Glenetta Hew, M.D., M.S. Interventional Cardiologist   Pager # [phone] Phone # [phone] [address] [place], [place] [address]   Thank you for choosing Heart[facility] at [facility]!!

## 2021-07-10 ENCOUNTER — Encounter: Payer: Self-pay | Admitting: Cardiology

## 2021-07-10 NOTE — Assessment & Plan Note (Signed)
His home labs look outstanding.  He does not really even take his fish oil.  Not currently on any active medications.

## 2021-07-10 NOTE — Assessment & Plan Note (Signed)
He does seem to have some progression of his exertional dyspnea but I suspect a lot of this is related to deconditioning and almost anhedonia.  We will recheck a 2D echocardiogram to ensure the valve is stable.  He does not have any PND orthopnea to suspect CHF as a component. Edema does not seem to be all that significant, and I would prefer to avoid diuretic, would prefer to use foot elevation.  We talked about ischemic evaluation and other studies besides echocardiogram.  At this point, he does not feel that his symptoms have progressed to the point that there is much change compared to last year.  As such, we will hold off on further evaluation beyond echocardiogram.

## 2021-07-10 NOTE — Assessment & Plan Note (Signed)
He is gaining weight.  I think this is because of being sedentary.  We talked about dietary adjustments, provided some recommendations.  A lot of this is related to deconditioning and lack of exercise.  Stressed the importance of maintaining his walking.

## 2021-07-10 NOTE — Assessment & Plan Note (Signed)
Stable blood pressure being off beta-blocker.  Remains on losartan.  Not on diuretic.

## 2021-07-10 NOTE — Assessment & Plan Note (Addendum)
Borderline criteria for moderate aortic stenosis by last echocardiogram.  I do think that the murmur seems maybe a little louder than I previously described in my last exam.  States he does seem to have more exercise intolerance and worsening dyspnea on exertion, we will move up his follow-up echocardiogram to now (and cancel his March echo).  Does not have parvus at tardus on exam although difficult to assess.  It would be unlikely that his valve is progressed to severe aortic stenosis, but definitely we need to evaluate.  If it is worse, we will have him return sooner to discuss further plans.

## 2021-07-26 ENCOUNTER — Ambulatory Visit (HOSPITAL_COMMUNITY): Payer: Medicare Other | Attending: Cardiovascular Disease

## 2021-07-26 ENCOUNTER — Other Ambulatory Visit: Payer: Self-pay

## 2021-07-26 DIAGNOSIS — I350 Nonrheumatic aortic (valve) stenosis: Secondary | ICD-10-CM | POA: Insufficient documentation

## 2021-07-26 LAB — ECHOCARDIOGRAM COMPLETE
AR max vel: 1.14 cm2
AV Area VTI: 1.25 cm2
AV Area mean vel: 1.21 cm2
AV Mean grad: 35 mmHg
AV Peak grad: 61.8 mmHg
Ao pk vel: 3.93 m/s
Area-P 1/2: 2.54 cm2
P 1/2 time: 381 msec
S' Lateral: 3.3 cm

## 2021-07-28 ENCOUNTER — Telehealth: Payer: Self-pay | Admitting: *Deleted

## 2021-07-28 DIAGNOSIS — I350 Nonrheumatic aortic (valve) stenosis: Secondary | ICD-10-CM

## 2021-07-28 NOTE — Telephone Encounter (Signed)
-----   Message from Leonie Man, MD sent at 07/27/2021  6:49 PM EDT ----- Echocardiogram follow-up shows normal left ventricular pump function.  No wall motion abnormalities.  The right ventricle also looks relatively normal.  Maybe mildly elevated pressures.  The aortic valve has progressed from moderate to moderate to severe stenosis.  The mean gradient is now 35 mmHg, up from 21.  The cutoff for severe stenosis is a mean gradient of 69mmHg.  Since there was pretty significant progression from March 2021 to now, would recheck an echocardiogram in 6 months to make sure that it is not progressing more rapidly.   Glenetta Hew, MD   Plan - recheck 2 D Echo in 6 months to reassess Aortic Stenosis

## 2021-07-28 NOTE — Telephone Encounter (Signed)
6 month follow up 10/2022The patient has been notified of the result and verbalized understanding.  All questions (if any) were answered. Patient aware will need another echo in 6 month ( April 2023)  Raiford Simmonds, RN 07/28/2021 2:57 PM

## 2021-08-04 DIAGNOSIS — I700 Atherosclerosis of aorta: Secondary | ICD-10-CM | POA: Diagnosis not present

## 2021-08-04 DIAGNOSIS — I2510 Atherosclerotic heart disease of native coronary artery without angina pectoris: Secondary | ICD-10-CM | POA: Diagnosis not present

## 2021-08-04 DIAGNOSIS — R413 Other amnesia: Secondary | ICD-10-CM | POA: Diagnosis not present

## 2021-08-04 DIAGNOSIS — Z0000 Encounter for general adult medical examination without abnormal findings: Secondary | ICD-10-CM | POA: Diagnosis not present

## 2021-08-04 DIAGNOSIS — E039 Hypothyroidism, unspecified: Secondary | ICD-10-CM | POA: Diagnosis not present

## 2021-08-04 DIAGNOSIS — E538 Deficiency of other specified B group vitamins: Secondary | ICD-10-CM | POA: Diagnosis not present

## 2021-08-04 DIAGNOSIS — R062 Wheezing: Secondary | ICD-10-CM | POA: Diagnosis not present

## 2021-08-04 DIAGNOSIS — K219 Gastro-esophageal reflux disease without esophagitis: Secondary | ICD-10-CM | POA: Diagnosis not present

## 2021-08-04 DIAGNOSIS — I10 Essential (primary) hypertension: Secondary | ICD-10-CM | POA: Diagnosis not present

## 2021-08-04 DIAGNOSIS — Z6841 Body Mass Index (BMI) 40.0 and over, adult: Secondary | ICD-10-CM | POA: Diagnosis not present

## 2021-08-04 DIAGNOSIS — D692 Other nonthrombocytopenic purpura: Secondary | ICD-10-CM | POA: Diagnosis not present

## 2021-08-18 ENCOUNTER — Encounter: Payer: Self-pay | Admitting: Pulmonary Disease

## 2021-08-18 ENCOUNTER — Other Ambulatory Visit: Payer: Self-pay

## 2021-08-18 ENCOUNTER — Ambulatory Visit (INDEPENDENT_AMBULATORY_CARE_PROVIDER_SITE_OTHER): Payer: Medicare Other | Admitting: Pulmonary Disease

## 2021-08-18 VITALS — BP 124/66 | HR 81 | Temp 97.8°F | Ht 74.0 in | Wt 295.4 lb

## 2021-08-18 DIAGNOSIS — R0609 Other forms of dyspnea: Secondary | ICD-10-CM

## 2021-08-18 MED ORDER — ALBUTEROL SULFATE HFA 108 (90 BASE) MCG/ACT IN AERS: INHALATION_SPRAY | RESPIRATORY_TRACT | 5 refills | Status: AC

## 2021-08-18 MED ORDER — BUDESONIDE-FORMOTEROL FUMARATE 160-4.5 MCG/ACT IN AERO: 2.0000 | INHALATION_SPRAY | Freq: Two times a day (BID) | RESPIRATORY_TRACT | 5 refills | Status: DC

## 2021-08-18 NOTE — Progress Notes (Signed)
Raymond Cuevas    [id number]    1934/08/06  Primary Care Physician:Pharr, Thayer Jew, MD  Referring Physician: Deland Pretty, MD [place],  [place] [address]  Chief complaint: Follow-up for asthma  HPI: 87 year old with history of GERD, allergic rhinitis.  Seen by primary care on 01/15/18 for cough, wheezing, diagnosed with asthmatic bronchitis and treated with azithromycin and steroids.  Chest x-ray some mild scarring at the base with no acute process. He states that his breathing is much improved today.  Denies any cough, sputum production, wheezing He has been outside doing yard work and mowing the lawn yesterday without any shortness of breath or respiratory issues  He had a CT angio in March 2019 which did not show any pulmonary embolism or acute lung abnormality.  There were 2 subcentimeter pulmonary nodules in the right lower lobe and left lower lobe.  Seen back in pulmonary clinic in 2021 after a gap of 2 years for increasing dyspnea on exertion.  States that his neighbors do not let him do outside yard work due to concern for his breathing. Started on Symbicort inhaler  Pets: No pets.  He has a bird feeder in front yard and is exposed to wild birds Occupation: Retired Administrator Exposures: No dampness, mold , jacuzzi,, hot tub Smoking history: Never smoker Travel history: Grew up in [place].  No significant travel  Interim history: Continues on Symbicort inhaler States that dyspnea on exertion is unchanged.  No exacerbations or ER visits  Outpatient Encounter Medications as of 08/18/2021  Medication Sig   albuterol (VENTOLIN HFA) 108 (90 Base) MCG/ACT inhaler INHALE 2 PUFFS BY MOUTH AS NEEDED 4 TIMES DAILY UNTIL WHEEZING EXTINGUISHED. RINSE AND SPIT AFTER USE. DEMONSTRATE PROPER TECHNIQUE INHALATI   aspirin EC 81 MG tablet Take 81 mg by mouth daily.   budesonide-formoterol (SYMBICORT) 160-4.5 MCG/ACT inhaler Inhale 2 puffs into the  lungs in the morning and at bedtime.   calcium-vitamin D (OSCAL WITH D) 500-200 MG-UNIT per tablet Take 1 tablet by mouth.   ferrous sulfate 325 (65 FE) MG tablet Take 1 tablet (325 mg total) by mouth 3 (three) times daily after meals.   fish oil-omega-3 fatty acids 1000 MG capsule Take 1 g by mouth daily.   HYDROcodone-acetaminophen (NORCO/VICODIN) 5-325 MG tablet Take 1-2 tablets by mouth every 6 (six) hours as needed.   levothyroxine (SYNTHROID) 25 MCG tablet Take 25 mcg by mouth. Half a tab for 21 days then 1 tab everyday til blood work taking.   lidocaine-prilocaine (EMLA) cream Apply 1 application topically as needed.   magnesium hydroxide (MILK OF MAGNESIA) 400 MG/5ML suspension Take 30 mLs by mouth at bedtime.   predniSONE (DELTASONE) 10 MG tablet Take 10 mg by mouth daily with breakfast.   losartan (COZAAR) 25 MG tablet Take 1 tablet (25 mg total) by mouth daily.   No facility-administered encounter medications on file as of 08/18/2021.   Physical Exam: Blood pressure 124/66, pulse 81, temperature 97.8 F (36.6 C), temperature source Oral, height 6\' 2"  (1.88 m), weight 295 lb 6.4 oz (134 kg), SpO2 93 %. Gen:      No acute distress HEENT:  EOMI, sclera anicteric Neck:     No masses; no thyromegaly Lungs:    Clear to auscultation bilaterally; normal respiratory effort CV:         Regular rate and rhythm; no murmurs Abd:      + bowel sounds;  soft, non-tender; no palpable masses, no distension Ext:    No edema; adequate peripheral perfusion Skin:      Warm and dry; no rash Neuro: alert and oriented x 3 Psych: normal mood and affect   Data Reviewed: Imaging: Chest x-ray 01/15/17 Mild scarring at the left base, no edema or consolidation.  Stable cardiac silhouette.  CT chest 01/08/2018 No acute lung process.  Mild atelectasis at the bases.  4 mm right lower lobe, 5 mm left lower lobe nodule.  CT chest 05/03/2019-stable pulmonary nodules  Chest x-ray report from primary care  03/25/2020-no acute abnormality.  Chest x-ray 05/06/2020-low lung volumes with atelectasis at the bases. I have reviewed the images personally.  PFTs 03/15/2018 FVC 3.86 [86%], FEV1 2.99 [94%), F/F 78, TLC 91%, DLCO 69%, DLCO/VA 83% No obstruction or restriction.  Mild diffusion defect that corrects for alveolar volume  06/30/2020 FVC 3.19 [72%], FEV1 2.1 [71%], F/F 69 Normal spirometry  FENO 02/09/2018-19   Labs CBC 02/09/2018-WBC 5.9, eos 2.9%, absolute eosinophil count 200 Blood allergy profile/26/19-IgE 20, RAST panel shows mild allergies to tree pollen  Assessment:  Mild asthma PFTs reviewed with no obstruction or eosinophilia Continue on Symbicort Suspect weight loss and deconditioning playing a role in presentation Advised diet and exercise  Plan/Recommendations: Continue Symbicort Weight loss with diet and exercise  [name] [facility] 08/18/2021, 2:41 PM  CC: Deland Pretty, MD

## 2021-08-18 NOTE — Patient Instructions (Signed)
I am glad you are stable with your breathing Continue the Symbicort Work on weight loss with diet and exercise Follow-up in 6 months

## 2021-09-13 NOTE — Progress Notes (Deleted)
Cardiology Clinic Note   Patient Name: Raymond Cuevas Date of Encounter: 09/13/2021  Primary Care Provider:  Deland Pretty, MD Primary Cardiologist:  Glenetta Hew, MD  Patient Profile    Raymond Cuevas presents to the clinic today for follow-up evaluation of his essential hypertension, hyperlipidemia, and aortic stenosis.  Past Medical History    Past Medical History:  Diagnosis Date   Arthritis    osteoarthritis   COPD (chronic obstructive pulmonary disease) (HCC)    GERD (gastroesophageal reflux disease)    occ.   Hyperlipidemia LDL goal <100 09/25/2020   Hypertension    Moderate aortic stenosis by prior echocardiogram 09/06/2018   TTE (12/19/2019): Normal LV size, function with no R WMA.  Moderate concentric LVH.  EF 55-60%.  GR 1 DD.  Normal RV size and function.  Moderate aortic valve stenosis (mean gradient 21 mmHg).   Osteoarthritis    S/P right THA, AA 08/27/2013   Past Surgical History:  Procedure Laterality Date   CATARACT EXTRACTION, BILATERAL Bilateral    CHOLECYSTECTOMY     laparoscopic   EYE SURGERY Left    hole repair   JOINT REPLACEMENT Right    Knee   NM MYOVIEW LTD  11/2016    normal EF (55 to 65%).  No ischemia or infarction.  LOW RISK.   REPLACEMENT TOTAL KNEE     TONSILLECTOMY     TOTAL HIP ARTHROPLASTY Right 08/27/2013   Procedure: RIGHT TOTAL HIP ARTHROPLASTY ANTERIOR APPROACH;  Surgeon: Mauri Pole, MD;  Location: [facility];  Service: Orthopedics;  Laterality: Right;   TRANSTHORACIC ECHOCARDIOGRAM  12/2017   Mild LVH.  Normal EF 55-60%.  GR 1 DD.  No RWMA.  Mild AS (mean gradient 21 mm)   TRANSTHORACIC ECHOCARDIOGRAM  12/2019   Normal LV size, function with no R WMA.  Moderate concentric LVH.  EF 55-60%.  GR 1 DD.  Normal RV size and function.  Moderate aortic valve stenosis (mean gradient 21 mmHg).    Allergies  Allergies  Allergen Reactions   Codeine Other (See Comments)    "Walking the walls"   Morphine And Related Other (See  Comments)    "Walking the walls"   Sulfa Antibiotics Rash    History of Present Illness    Raymond Cuevas has a PMH of essential hypertension, moderate aortic stenosis, morbid obesity, dyspnea on exertion, hyperlipidemia, and is status post right total hip arthroplasty.  He was seen in follow-up 09/14/2020 for 58-month follow-up and remained stable.  He reported that his energy level had improved while being off beta blocking medication.  He did however not notice a significant increase in energy.  He was able to do 4 laps back-and-forth on his driveway which was around 150 feet, with less exertional dyspnea.  While on his beta-blocker he was able to do 4 laps and would need occasional breaks to catch his breath.  He reported he walked slowly and deliberately due to his unsteady gait.  He denied orthopnea and PND.  His lower extremity swelling was well controlled with his diuresis.  His echocardiogram 3/23 showed no changes in his aortic stenosis.  He was seen by Dr. Ellyn Hack 07/09/2021 in follow-up.  He presented with his daughter.  She was transitioning into caregiver role.  She was concerned about his memory.  She noted that he was more forgetful, was getting lost, and was forgetting places.  His long-term memory was intact.  She reported slightly more lower extremity swelling  than usual.  She also noted slightly increased exertional dyspnea.  He reported that he was continuing to walk up and down his driveway.  He was noted to be somewhat declined in his physical activity over the previous year.  It was felt that some of his limitations were due to his knee pain and back pain from his arthritis.  He was no longer on his diuretic.  He also had not been using his Symbicort medication routinely.  This was felt to be a factor in his increased DOE.  He was noted to have bilateral +1 lower extremity edema.  A repeat echocardiogram 07/26/2021 showed EF 60-65%, intermediate diastolic parameters and a  progression of his aortic stenosis from moderate to moderate-severe.  He presents to the clinic today for follow-up evaluation and states***  *** denies chest pain, shortness of breath, lower extremity edema, fatigue, palpitations, melena, hematuria, hemoptysis, diaphoresis, weakness, presyncope, syncope, orthopnea, and PND.   Home Medications    Prior to Admission medications   Medication Sig Start Date End Date Taking? Authorizing Provider  albuterol (VENTOLIN HFA) 108 (90 Base) MCG/ACT inhaler INHALE 2 PUFFS BY MOUTH AS NEEDED 4 TIMES DAILY UNTIL WHEEZING EXTINGUISHED. RINSE AND SPIT AFTER USE. DEMONSTRATE PROPER TECHNIQUE INHALATI 08/18/21   Mannam, Hart Robinsons, MD  aspirin EC 81 MG tablet Take 81 mg by mouth daily.    [provider]  budesonide-formoterol (SYMBICORT) 160-4.5 MCG/ACT inhaler Inhale 2 puffs into the lungs in the morning and at bedtime. 08/18/21   Mannam, Hart Robinsons, MD  calcium-vitamin D (OSCAL WITH D) 500-200 MG-UNIT per tablet Take 1 tablet by mouth.    [provider]  ferrous sulfate 325 (65 FE) MG tablet Take 1 tablet (325 mg total) by mouth 3 (three) times daily after meals. 08/29/13   Danae Orleans, PA-C  fish oil-omega-3 fatty acids 1000 MG capsule Take 1 g by mouth daily.    [provider]  HYDROcodone-acetaminophen (NORCO/VICODIN) 5-325 MG tablet Take 1-2 tablets by mouth every 6 (six) hours as needed. 10/06/20   Volanda Napoleon, PA-C  levothyroxine (SYNTHROID) 25 MCG tablet Take 25 mcg by mouth. Half a tab for 21 days then 1 tab everyday til blood work taking. 07/06/21   [provider]  lidocaine-prilocaine (EMLA) cream Apply 1 application topically as needed. 10/06/20   Volanda Napoleon, PA-C  losartan (COZAAR) 25 MG tablet Take 1 tablet (25 mg total) by mouth daily. 09/10/19 09/14/20  Leonie Man, MD  magnesium hydroxide (MILK OF MAGNESIA) 400 MG/5ML suspension Take 30 mLs by mouth at bedtime.    [provider]   predniSONE (DELTASONE) 10 MG tablet Take 10 mg by mouth daily with breakfast.    [provider]    Family History    Family History  Problem Relation Age of Onset   Sudden death Sister 106   He indicated that his mother is deceased. He indicated that his father is deceased. He indicated that the status of his sister is unknown. He indicated that his brother is deceased. He indicated that his maternal grandmother is deceased. He indicated that his maternal grandfather is deceased. He indicated that his paternal grandmother is deceased. He indicated that his paternal grandfather is deceased.  Social History    Social History   Socioeconomic History   Marital status: Widowed    Spouse name: Not on file   Number of children: Not on file   Years of education: Not on file   Highest education  level: Not on file  Occupational History   Not on file  Tobacco Use   Smoking status: Never   Smokeless tobacco: Never  Vaping Use   Vaping Use: Never used  Substance and Sexual Activity   Alcohol use: Yes    Alcohol/week: 1.0 standard drink    Types: 1 Cans of beer per week    Comment: rarely   Drug use: No   Sexual activity: Never    Birth control/protection: None  Other Topics Concern   Not on file  Social History Narrative   Not on file   Social Determinants of Health   Financial Resource Strain: Not on file  Food Insecurity: Not on file  Transportation Needs: Not on file  Physical Activity: Not on file  Stress: Not on file  Social Connections: Not on file  Intimate Partner Violence: Not on file     Review of Systems    General:  No chills, fever, night sweats or weight changes.  Cardiovascular:  No chest pain, dyspnea on exertion, edema, orthopnea, palpitations, paroxysmal nocturnal dyspnea. Dermatological: No rash, lesions/masses Respiratory: No cough, dyspnea Urologic: No hematuria, dysuria Abdominal:   No nausea, vomiting, diarrhea, bright red blood per  rectum, melena, or hematemesis Neurologic:  No visual changes, wkns, changes in mental status. All other systems reviewed and are otherwise negative except as noted above.  Physical Exam    VS:  There were no vitals taken for this visit. , BMI There is no height or weight on file to calculate BMI. GEN: Well nourished, well developed, in no acute distress. HEENT: normal. Neck: Supple, no JVD, carotid bruits, or masses. Cardiac: RRR, no murmurs, rubs, or gallops. No clubbing, cyanosis, edema.  Radials/DP/PT 2+ and equal bilaterally.  Respiratory:  Respirations regular and unlabored, clear to auscultation bilaterally. GI: Soft, nontender, nondistended, BS + x 4. MS: no deformity or atrophy. Skin: warm and dry, no rash. Neuro:  Strength and sensation are intact. Psych: Normal affect.  Accessory Clinical Findings    Recent Labs: No results found for requested labs within last 8760 hours.   Recent Lipid Panel No results found for: CHOL, TRIG, HDL, CHOLHDL, VLDL, LDLCALC, LDLDIRECT  ECG personally reviewed by me today- *** - No acute changes  Echocardiogram 07/26/2021  IMPRESSIONS     1. Left ventricular ejection fraction, by estimation, is 60 to 65%. The  left ventricle has normal function. The left ventricle has no regional  wall motion abnormalities. Left ventricular diastolic parameters are  indeterminate. The average left  ventricular global longitudinal strain is -24.6 %. The global longitudinal  strain is normal.   2. Right ventricular systolic function is normal. The right ventricular  size is normal. There is mildly elevated pulmonary artery systolic  pressure.   3. Right atrial size was mildly dilated.   4. The mitral valve is normal in structure. No evidence of mitral valve  regurgitation. No evidence of mitral stenosis.   5. The mean AV gradient has increased from 21 mmHg to 35 mmHg since  previous echo in March, 2021.      . The aortic valve is calcified. There is  moderate calcification of  the aortic valve. There is mild thickening of the aortic valve. Aortic  valve regurgitation is trivial. Moderate to severe aortic valve stenosis.   Comparison(s): 12/19/19 EF 55-60%. Moderate AS 74mmHg mean PG, 29mmHg peak  PG.    Assessment & Plan   1.  Aortic stenosis-stable DOE and stable activity tolerance.  Limited in his activity by his back pain and knee pain.  Continues to slowly walk up and down his driveway.  Repeat echocardiogram showed normal LVEF and slightly worse aortic stenosis now moderate-severe.  Details above. Continue losartan Heart healthy low-sodium diet-salty 6 given Increase physical activity as tolerated Repeat echocardiogram 10/23 or when clinically indicated.  Essential hypertension-BP today***.  Well-controlled at home.  Previously on beta blocking medication which increased fatigue. Continue with losartan Heart healthy low-sodium diet-salty 6 given Increase physical activity as tolerated  DOE-remained stable.  Follow-up echocardiogram showed normal LVEF and slightly worse aortic stenosis. Increase physical activity as tolerated Continue heart healthy low-sodium diet  Morbid obesity-weight today***.  Continue with dietary adjustments. Continue weight loss Increase physical activity as tolerated  Hyperlipidemia-LDL*** Continue aspirin, omega-3 fatty acids, Heart healthy low-sodium high-fiber diet Increase physical activity as tolerated Follows with PCP  Follow-up with Dr. Ellyn Hack or me in 4-6 months.  Jossie Ng. Helmer Dull NP-C    09/13/2021, 6:53 AM [place] [address] Office [phone] Fax [phone]  Notice: This dictation was prepared with Dragon dictation along with smaller phrase technology. Any transcriptional errors that result from this process are unintentional and may not be corrected upon review.  I spent***minutes examining this patient, reviewing medications,  and using patient centered shared decision making involving her cardiac care.  Prior to her visit I spent greater than 20 minutes reviewing her past medical history,  medications, and prior cardiac tests.

## 2021-09-15 ENCOUNTER — Ambulatory Visit: Payer: Medicare Other | Admitting: General Practice

## 2021-12-09 DIAGNOSIS — E039 Hypothyroidism, unspecified: Secondary | ICD-10-CM | POA: Diagnosis not present

## 2021-12-14 ENCOUNTER — Encounter (HOSPITAL_COMMUNITY): Payer: Self-pay | Admitting: Cardiology

## 2021-12-14 DIAGNOSIS — E039 Hypothyroidism, unspecified: Secondary | ICD-10-CM | POA: Diagnosis not present

## 2021-12-14 DIAGNOSIS — J4540 Moderate persistent asthma, uncomplicated: Secondary | ICD-10-CM | POA: Diagnosis not present

## 2022-01-25 DIAGNOSIS — E781 Pure hyperglyceridemia: Secondary | ICD-10-CM | POA: Diagnosis not present

## 2022-01-25 DIAGNOSIS — K219 Gastro-esophageal reflux disease without esophagitis: Secondary | ICD-10-CM | POA: Diagnosis not present

## 2022-01-25 DIAGNOSIS — Z7982 Long term (current) use of aspirin: Secondary | ICD-10-CM | POA: Diagnosis not present

## 2022-01-25 DIAGNOSIS — I10 Essential (primary) hypertension: Secondary | ICD-10-CM | POA: Diagnosis not present

## 2022-02-02 ENCOUNTER — Ambulatory Visit (HOSPITAL_COMMUNITY): Payer: Medicare Other | Attending: Internal Medicine

## 2022-02-02 DIAGNOSIS — I350 Nonrheumatic aortic (valve) stenosis: Secondary | ICD-10-CM | POA: Diagnosis not present

## 2022-02-02 HISTORY — PX: TRANSTHORACIC ECHOCARDIOGRAM: SHX275

## 2022-02-02 HISTORY — DX: Nonrheumatic aortic (valve) stenosis: I35.0

## 2022-02-02 LAB — ECHOCARDIOGRAM COMPLETE
AR max vel: 1.02 cm2
AV Area VTI: 1.05 cm2
AV Area mean vel: 0.98 cm2
AV Mean grad: 44 mmHg
AV Peak grad: 74.3 mmHg
Ao pk vel: 4.31 m/s
Area-P 1/2: 2.42 cm2
P 1/2 time: 507 ms
S' Lateral: 3.2 cm

## 2022-02-11 ENCOUNTER — Telehealth: Payer: Self-pay | Admitting: Cardiology

## 2022-02-11 NOTE — Telephone Encounter (Signed)
Pt daughter (Genther,Josettie-emergency contact in telephone notes) informed of providers result & recommendations. Pt verbalized understanding. No further questions . ?Pt has appt in June to discuss.  ?

## 2022-02-11 NOTE — Telephone Encounter (Signed)
Follow Up: ? ? ? ? ?Daughter called and said patient would like to know the results of his Echo that he had on 01-1922 please. ?

## 2022-02-14 NOTE — Telephone Encounter (Signed)
I commented on this echo result 3 to 4 days ago. ?

## 2022-02-15 ENCOUNTER — Ambulatory Visit: Payer: Medicare Other | Admitting: Pulmonary Disease

## 2022-02-28 ENCOUNTER — Ambulatory Visit (INDEPENDENT_AMBULATORY_CARE_PROVIDER_SITE_OTHER): Payer: Medicare Other | Admitting: Cardiology

## 2022-02-28 ENCOUNTER — Encounter: Payer: Self-pay | Admitting: Cardiology

## 2022-02-28 VITALS — BP 132/74 | HR 79 | Ht 74.0 in | Wt 285.0 lb

## 2022-02-28 DIAGNOSIS — Z7189 Other specified counseling: Secondary | ICD-10-CM | POA: Insufficient documentation

## 2022-02-28 DIAGNOSIS — R0609 Other forms of dyspnea: Secondary | ICD-10-CM

## 2022-02-28 DIAGNOSIS — I350 Nonrheumatic aortic (valve) stenosis: Secondary | ICD-10-CM | POA: Diagnosis not present

## 2022-02-28 DIAGNOSIS — E785 Hyperlipidemia, unspecified: Secondary | ICD-10-CM

## 2022-02-28 DIAGNOSIS — I10 Essential (primary) hypertension: Secondary | ICD-10-CM

## 2022-02-28 NOTE — Progress Notes (Signed)
? ?Primary Care Provider: Deland Pretty, MD ?Cardiologist: Glenetta Hew, MD ?Electrophysiologist: None ? ?Clinic Note: ?Chief Complaint  ?Patient presents with  ? Follow-up  ?  Noting memory loss, significant weight loss, fatigue and exertional dyspnea.  ? Aortic Stenosis  ?  Follow-up echocardiogram.  Has had 2 since last visit with progression from mild then moderate and now severe  ? ?=================================== ? ?ASSESSMENT/PLAN  ? ?Problem List Items Addressed This Visit   ? ?  ? Cardiology Problems  ? Essential hypertension (Chronic)  ?  Blood pressure looks pretty well controlled on current dose of losartan.  I suspect that with his weight loss, this will be easier to control. ?.  No longer on beta-blocker or diuretic. ? ?  ?  ? Relevant Orders  ? EKG 12-Lead  ? Severe aortic stenosis by prior echocardiogram - Primary (Chronic)  ?  He has definitely shown Progression in the last year from previous reported mean gradient 21 mmHg up to 35 and now 40+ mmHg.  Clearly meets criteria for severe aortic stenosis, and he is definitely symptomatic with exertional dyspnea and fatigue.  It does not seem like he is having chest pain syncope or near syncope.  And is difficult to tell if his dyspnea is related to the valve or his COPD. ?There is also concern that he is pretty debilitated now.  Not very mobile.  Starting to have sounds of dementia. ? ?We talked about potentially going forward with TAVR I discussed the TAVR with him and discussed right heart cath.  He seems at this point resigned and the idea that he probably would not want to have invasive procedures done.  However he would like to take it under advisement discussed. ? ?We will see him back via telehealth in a few months to discuss whether not he will want to go forward. ? ?  ?  ? Relevant Orders  ? EKG 12-Lead  ? Hyperlipidemia LDL goal <100 (Chronic)  ?  Lipids followed by labs.  Likely well controlled.  He is no longer on a statin.-With  progressive dementia ? ?  ?  ?  ? Other  ? Morbid obesity ([place]) (Chronic)  ?  He had been gaining weight because of sedentary lifestyle, but now he is starting to show signs of almost failure to thrive and that he is lost quite a bit of weight, not eating and drinking well.  Sleeping quite a bit.  Has profound dyspnea. ? ?I am not sure how healthy this weight loss truly is. ? ?  ?  ? Dyspnea on exertion (Chronic)  ?  Multifactorial exertional dyspnea.  Clearly related to deconditioning, obesity and COPD, but also now with progression to severe aortic stenosis, there is probably still a component there.  Hard to tell how much with activity is limited by progression to aortic stenosis versus baseline. ? ?He is not having any exertional chest pain.  At this point, if we are not to proceed with TAVR, I do not think we would do an evaluation for ischemic etiology either. ? ?  ?  ? Relevant Orders  ? EKG 12-Lead  ? Goals of care, counseling/discussion (Chronic)  ?  We did not get into specific details of CODE STATUS during this visit, but we had a frank talk between the patient and his daughter and myself about making decisions that are important going forward i.e. whether or not he would want to be invasive and aggressive with treating his aortic  stenosis.  Obviously this is not wanted done decision, but we need to sort of have a decision now before he gets sick And What Direction He Would Want to Go.  Current state, he insinuates that he is 88 years old, and he is not sure he wants to go through with any procedures.  He has not changed his arthritis pains in his COPD. ? ?In follow-up visit, we will discuss his potential decision in more detail.  I think we can also broach the topic of CODE STATUS if this has not been done by his PCP.  Now that he has an otherwise terminal condition with severe stenosis that is not treated, in the absence of TAVR, CPR would not likely be life saving. ? ?  ?  ? ?Other Visit Diagnoses    ? ? Moderate to severe aortic stenosis      ? Relevant Orders  ? EKG 12-Lead  ? ?  ? ? ?=================================== ? ?HPI:   ? ?NALU TROUBLEFIELD is an obese (previously morbidly obese) 88 y.o. male with a PMH notable for AORTIC STENOSIS-SEVERE (recent progression), COPD, HTN, and HLD who presents today for delayed 61-monthfollow-up to discuss results of follow-up echocardiogram ? ?JCORDARO MUKAIwas last seen on July 09, 2021:   We had stopped his beta-blocker previously for bradycardia and fatigue.  It was doing better.  Palpitations and blood pressure stable.  Noted progressive exertional dyspnea but no PND orthopnea.  Edema not significant either. => Repeat 2D echo ordered to reassess aortic stenosis. ?His daughter has taken over as his primary caregiver.Very concerned about multiple medical issues but also very concerned about his memory loss issues. ?Indicated that he was not using his diuretic, no was using his Symbicort regularly ? ?Recent Hospitalizations: None ? ?Reviewed  CV studies:   ? ?The following studies were reviewed today: (if available, images/films reviewed: From Epic Chart or Care Everywhere) ?TTE 07/2021: EF 60-65%. No RWMA. Mod AS (MG 35 mmHg, PG 37 mmHg) ->  ?Due to progression of disease, repeat checked in 6 months. ?TTE 02/02/2022: EF> 75%.  Moderate LVH.  GR 1 DD.  Normal RV and RVP.  Severe aortic valve calcification with SEVERE AS.  MG 44 mmHg up from 37 mmHg; AVA by VTI 1.05 cm?. ? ? ?Interval History:  ? ?JMertha Baarsreturns here today along with his daughter to discuss results of his echo.He was getting frustrated having to wait and this is something that his daughter indicates is a progressively increasing phenomenon.  He is easily agitated, and his memory is definitely worse.  He did not remember me when I walked in the room.  He does not know his grandson by name. ? ?He has baseline exertional dyspnea but is very sedentary.  Does not do much more than  walking around the house. Has to use a cane or walker.  He does not like doing that.  Notes fatigue more so than just dyspnea with exertion.  No associated chest pain or pressure. ?Interestingly despite lack of activity, he has lost a lot of weight.  Noting decreased appetite. ?He is not sleeping well, wakes up a lot.  He will wake up from the bed and walk Recliner where he does seem to sleep better.  However when asked about PND and orthopnea, he denied that.  Edema pretty well controlled. ? ?He also is quite dizzy and has poor balance.He is almost always scared to walk.  He is scared that  he may have a a fall.  He apparently has had a couple falls in the last few months. ? ?CV Review of Symptoms (Summary) ?Cardiovascular ROS: positive for - dyspnea on exertion, edema, shortness of breath, and Fatigue, poor balance, hypersomnia. ?negative for - chest pain, irregular heartbeat, orthopnea, palpitations, paroxysmal nocturnal dyspnea, rapid heart rate, shortness of breath, or True syncope or near syncope, TIA or amaurosis fugax.  Claudication. ? ?REVIEWED OF SYSTEMS  ? ?Review of Systems  ?Constitutional:  Positive for malaise/fatigue and weight loss (Just not eating as much.  Poor appetite.). Negative for fever.  ?HENT:  Negative for congestion and nosebleeds.   ?Respiratory:  Positive for shortness of breath (With modest exertion.).   ?Cardiovascular:  Positive for leg swelling (Trivial).  ?Gastrointestinal:  Positive for constipation. Negative for blood in stool and melena.  ?Genitourinary:  Positive for frequency. Negative for hematuria.  ?Musculoskeletal:  Positive for back pain, falls and joint pain (Hips and knees.). Negative for myalgias.  ?Neurological:  Positive for dizziness (Poor balance.) and weakness (Generalized). Negative for focal weakness.  ?Psychiatric/Behavioral:  Positive for depression (Any signs of anhedonia.  Weight loss with poor p.o. intake.  Frequent sleeping.) and memory loss (Much more  forgetful now than last visit.). The patient is not nervous/anxious and does not have insomnia (Hypersomnia).   ?Constitutional:  Positive for malaise/fatigue. Negative for weight loss (Gaining weight, partly because of

## 2022-02-28 NOTE — Assessment & Plan Note (Signed)
Blood pressure looks pretty well controlled on current dose of losartan.  I suspect that with his weight loss, this will be easier to control. ?.  No longer on beta-blocker or diuretic. ?

## 2022-02-28 NOTE — Assessment & Plan Note (Signed)
He has definitely shown Progression in the last year from previous reported mean gradient 21 mmHg up to 35 and now 40+ mmHg.  Clearly meets criteria for severe aortic stenosis, and he is definitely symptomatic with exertional dyspnea and fatigue.  It does not seem like he is having chest pain syncope or near syncope.  And is difficult to tell if his dyspnea is related to the valve or his COPD. ?There is also concern that he is pretty debilitated now.  Not very mobile.  Starting to have sounds of dementia. ? ?We talked about potentially going forward with TAVR I discussed the TAVR with him and discussed right heart cath.  He seems at this point resigned and the idea that he probably would not want to have invasive procedures done.  However he would like to take it under advisement discussed. ? ?We will see him back via telehealth in a few months to discuss whether not he will want to go forward. ?

## 2022-02-28 NOTE — Assessment & Plan Note (Signed)
He had been gaining weight because of sedentary lifestyle, but now he is starting to show signs of almost failure to thrive and that he is lost quite a bit of weight, not eating and drinking well.  Sleeping quite a bit.  Has profound dyspnea. ? ?I am not sure how healthy this weight loss truly is. ?

## 2022-02-28 NOTE — Assessment & Plan Note (Signed)
Lipids followed by labs.  Likely well controlled.  He is no longer on a statin.-With progressive dementia ?

## 2022-02-28 NOTE — Assessment & Plan Note (Signed)
Multifactorial exertional dyspnea.  Clearly related to deconditioning, obesity and COPD, but also now with progression to severe aortic stenosis, there is probably still a component there.  Hard to tell how much with activity is limited by progression to aortic stenosis versus baseline. ? ?He is not having any exertional chest pain.  At this point, if we are not to proceed with TAVR, I do not think we would do an evaluation for ischemic etiology either. ?

## 2022-02-28 NOTE — Assessment & Plan Note (Signed)
We did not get into specific details of CODE STATUS during this visit, but we had a frank talk between the patient and his daughter and myself about making decisions that are important going forward i.e. whether or not he would want to be invasive and aggressive with treating his aortic stenosis.  Obviously this is not wanted done decision, but we need to sort of have a decision now before he gets sick And What Direction He Would Want to Go.  Current state, he insinuates that he is 88 years old, and he is not sure he wants to go through with any procedures.  He has not changed his arthritis pains in his COPD. ? ?In follow-up visit, we will discuss his potential decision in more detail.  I think we can also broach the topic of CODE STATUS if this has not been done by his PCP.  Now that he has an otherwise terminal condition with severe stenosis that is not treated, in the absence of TAVR, CPR would not likely be life saving. ?

## 2022-02-28 NOTE — Patient Instructions (Signed)
Medication Instructions:  ? ?No changes ?*If you need a refill on your cardiac medications before your next appointment, please call your pharmacy* ? ? ?Lab Work: ? ?Not needed ? ? ?Testing/Procedures: ? ?Not needed ? ?Follow-Up: ?At [facility], you and your health needs are our priority.  As part of our continuing mission to provide you with exceptional heart care, we have created designated Provider Care Teams.  These Care Teams include your primary Cardiologist (physician) and Advanced Practice Providers (APPs -  Physician Assistants and Nurse Practitioners) who all work together to provide you with the care you need, when you need it. ? ?  ? ?Your next appointment:   ?2 month(s) ? ?The format for your next appointment:   ?In Person ? ?Provider:   ?Glenetta Hew, MD  ? ? ? ?

## 2022-02-28 NOTE — H&P (View-Only) (Signed)
Primary Care Provider: Deland Pretty, MD Cardiologist: Glenetta Hew, MD Electrophysiologist: None  Clinic Note: Chief Complaint  Patient presents with   Follow-up    Noting memory loss, significant weight loss, fatigue and exertional dyspnea.   Aortic Stenosis    Follow-up echocardiogram.  Has had 2 since last visit with progression from mild then moderate and now severe   ===================================  ASSESSMENT/PLAN   Problem List Items Addressed This Visit       Cardiology Problems   Essential hypertension (Chronic)    Blood pressure looks pretty well controlled on current dose of losartan.  I suspect that with his weight loss, this will be easier to control. .  No longer on beta-blocker or diuretic.       Relevant Orders   EKG 12-Lead   Severe aortic stenosis by prior echocardiogram - Primary (Chronic)    He has definitely shown Progression in the last year from previous reported mean gradient 21 mmHg up to 35 and now 40+ mmHg.  Clearly meets criteria for severe aortic stenosis, and he is definitely symptomatic with exertional dyspnea and fatigue.  It does not seem like he is having chest pain syncope or near syncope.  And is difficult to tell if his dyspnea is related to the valve or his COPD. There is also concern that he is pretty debilitated now.  Not very mobile.  Starting to have sounds of dementia.  We talked about potentially going forward with TAVR I discussed the TAVR with him and discussed right heart cath.  He seems at this point resigned and the idea that he probably would not want to have invasive procedures done.  However he would like to take it under advisement discussed.  We will see him back via telehealth in a few months to discuss whether not he will want to go forward.       Relevant Orders   EKG 12-Lead   Hyperlipidemia LDL goal <100 (Chronic)    Lipids followed by labs.  Likely well controlled.  He is no longer on a statin.-With  progressive dementia         Other   Morbid obesity ([place]) (Chronic)    He had been gaining weight because of sedentary lifestyle, but now he is starting to show signs of almost failure to thrive and that he is lost quite a bit of weight, not eating and drinking well.  Sleeping quite a bit.  Has profound dyspnea.  I am not sure how healthy this weight loss truly is.       Dyspnea on exertion (Chronic)    Multifactorial exertional dyspnea.  Clearly related to deconditioning, obesity and COPD, but also now with progression to severe aortic stenosis, there is probably still a component there.  Hard to tell how much with activity is limited by progression to aortic stenosis versus baseline.  He is not having any exertional chest pain.  At this point, if we are not to proceed with TAVR, I do not think we would do an evaluation for ischemic etiology either.       Relevant Orders   EKG 12-Lead   Goals of care, counseling/discussion (Chronic)    We did not get into specific details of CODE STATUS during this visit, but we had a frank talk between the patient and his daughter and myself about making decisions that are important going forward i.e. whether or not he would want to be invasive and aggressive with treating his aortic  stenosis.  Obviously this is not wanted done decision, but we need to sort of have a decision now before he gets sick And What Direction He Would Want to Go.  Current state, he insinuates that he is 88 years old, and he is not sure he wants to go through with any procedures.  He has not changed his arthritis pains in his COPD.  In follow-up visit, we will discuss his potential decision in more detail.  I think we can also broach the topic of CODE STATUS if this has not been done by his PCP.  Now that he has an otherwise terminal condition with severe stenosis that is not treated, in the absence of TAVR, CPR would not likely be life saving.       Other Visit Diagnoses      Moderate to severe aortic stenosis       Relevant Orders   EKG 12-Lead       ===================================  HPI:    ESAI STECKLEIN is an obese (previously morbidly obese) 88 y.o. male with a PMH notable for AORTIC STENOSIS-SEVERE (recent progression), COPD, HTN, and HLD who presents today for delayed 51-monthfollow-up to discuss results of follow-up echocardiogram  JARTIE MCINTYREwas last seen on July 09, 2021:   We had stopped his beta-blocker previously for bradycardia and fatigue.  It was doing better.  Palpitations and blood pressure stable.  Noted progressive exertional dyspnea but no PND orthopnea.  Edema not significant either. => Repeat 2D echo ordered to reassess aortic stenosis. His daughter has taken over as his primary caregiver.Very concerned about multiple medical issues but also very concerned about his memory loss issues. Indicated that he was not using his diuretic, no was using his Symbicort regularly  Recent Hospitalizations: None  Reviewed  CV studies:    The following studies were reviewed today: (if available, images/films reviewed: From Epic Chart or Care Everywhere) TTE 07/2021: EF 60-65%. No RWMA. Mod AS (MG 35 mmHg, PG 37 mmHg) ->  Due to progression of disease, repeat checked in 6 months. TTE 02/02/2022: EF> 75%.  Moderate LVH.  GR 1 DD.  Normal RV and RVP.  Severe aortic valve calcification with SEVERE AS.  MG 44 mmHg up from 37 mmHg; AVA by VTI 1.05 cm.   Interval History:   JPANAGIOTIS OELKERSreturns here today along with his daughter to discuss results of his echo.He was getting frustrated having to wait and this is something that his daughter indicates is a progressively increasing phenomenon.  He is easily agitated, and his memory is definitely worse.  He did not remember me when I walked in the room.  He does not know his grandson by name.  He has baseline exertional dyspnea but is very sedentary.  Does not do much more than  walking around the house. Has to use a cane or walker.  He does not like doing that.  Notes fatigue more so than just dyspnea with exertion.  No associated chest pain or pressure. Interestingly despite lack of activity, he has lost a lot of weight.  Noting decreased appetite. He is not sleeping well, wakes up a lot.  He will wake up from the bed and walk Recliner where he does seem to sleep better.  However when asked about PND and orthopnea, he denied that.  Edema pretty well controlled.  He also is quite dizzy and has poor balance.He is almost always scared to walk.  He is scared that  he may have a a fall.  He apparently has had a couple falls in the last few months.  CV Review of Symptoms (Summary) Cardiovascular ROS: positive for - dyspnea on exertion, edema, shortness of breath, and Fatigue, poor balance, hypersomnia. negative for - chest pain, irregular heartbeat, orthopnea, palpitations, paroxysmal nocturnal dyspnea, rapid heart rate, shortness of breath, or True syncope or near syncope, TIA or amaurosis fugax.  Claudication.  REVIEWED OF SYSTEMS   Review of Systems  Constitutional:  Positive for malaise/fatigue and weight loss (Just not eating as much.  Poor appetite.). Negative for fever.  HENT:  Negative for congestion and nosebleeds.   Respiratory:  Positive for shortness of breath (With modest exertion.).   Cardiovascular:  Positive for leg swelling (Trivial).  Gastrointestinal:  Positive for constipation. Negative for blood in stool and melena.  Genitourinary:  Positive for frequency. Negative for hematuria.  Musculoskeletal:  Positive for back pain, falls and joint pain (Hips and knees.). Negative for myalgias.  Neurological:  Positive for dizziness (Poor balance.) and weakness (Generalized). Negative for focal weakness.  Psychiatric/Behavioral:  Positive for depression (Any signs of anhedonia.  Weight loss with poor p.o. intake.  Frequent sleeping.) and memory loss (Much more  forgetful now than last visit.). The patient is not nervous/anxious and does not have insomnia (Hypersomnia).   Constitutional:  Positive for malaise/fatigue. Negative for weight loss (Gaining weight, partly because of deconditioning, lack of mobility, and "grazing"-eating in between meals.).  I have reviewed and (if needed) personally updated the patient's problem list, medications, allergies, past medical and surgical history, social and family history.   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 => TTE 02/02/2022: EF> 75%.  Moderate LVH.  GR 1 DD.  Normal RV and RVP.  Severe AS.  Mean gradient 44 mmHg up from 37 mmHg    PAST SURGICAL HISTORY   Past Surgical History:  Procedure Laterality Date   CATARACT EXTRACTION, BILATERAL Bilateral    CHOLECYSTECTOMY     laparoscopic   EYE SURGERY Left    hole repair   JOINT REPLACEMENT Right    Knee   NM MYOVIEW LTD  11/2016    normal EF (55 to 65%).  No ischemia or infarction.  LOW RISK.   REPLACEMENT TOTAL KNEE     TONSILLECTOMY     TOTAL HIP ARTHROPLASTY Right 08/27/2013   Procedure: RIGHT TOTAL HIP ARTHROPLASTY ANTERIOR APPROACH;  Surgeon: Mauri Pole, MD;  Location: [facility];  Service: Orthopedics;  Laterality: Right;   TRANSTHORACIC ECHOCARDIOGRAM  12/2017   a) 3/'19: Mild LVH.  Nl EF 55-60%.  GR 1 DD.  No RWMA.  Mild AS (MG 21 mm); Normal LV size, fxn w/o WMA.  Mod Conc VH.  EF 55-60%.  GR 1 DD.  NlRV size & fxn. Mod AS (MG  21 mmHg); b) 12/2019: Mild AS - MG 21 mmHg; c) 07/2021: EF 60-65%. No RWMA. Mod AS (MG 35  mmHg, PG 37 mmHg)   TRANSTHORACIC ECHOCARDIOGRAM  02/02/2022   EF> 75%.  Moderate LVH.  GR 1 DD.  Normal RV and RVP.  Severe aortic valve calcification with SEVERE AS.  MG 44 mmHg up from 37 mmHg; AVA by VTI 1.05 cm.    Immunization History  Administered Date(s) Administered   Influenza, High Dose Seasonal PF 07/31/2017   Influenza,inj,quad, With Preservative 07/17/2017, 07/01/2019   Influenza-Unspecified 08/17/2018   PFIZER(Purple Top)SARS-COV-2 Vaccination 01/23/2020, 02/17/2020, 09/08/2020   Pneumococcal Conjugate-13 11/25/2013   Pneumococcal Polysaccharide-23 12/23/2001, 07/25/2002    MEDICATIONS/ALLERGIES   Current Meds  Medication Sig   albuterol (VENTOLIN HFA) 108 (90 Base) MCG/ACT inhaler INHALE 2 PUFFS BY MOUTH AS NEEDED 4 TIMES DAILY UNTIL WHEEZING EXTINGUISHED. RINSE AND SPIT AFTER USE. DEMONSTRATE PROPER TECHNIQUE INHALATI   aspirin EC 81 MG tablet Take 81 mg by mouth daily.   budesonide-formoterol (SYMBICORT) 160-4.5 MCG/ACT inhaler Inhale 2 puffs into the lungs in the morning and at bedtime.   calcium-vitamin D (OSCAL WITH D) 500-200 MG-UNIT per tablet Take 1 tablet by mouth.   ferrous sulfate 325 (65 FE) MG tablet Take 1 tablet (325 mg total) by mouth 3 (three) times daily after meals.   fish oil-omega-3 fatty acids 1000 MG capsule Take 1 g by mouth daily.   levothyroxine (SYNTHROID) 25 MCG tablet Take 25 mcg by mouth. Half a tab for 21 days then 1 tab everyday til blood work taking.   lidocaine-prilocaine (EMLA) cream Apply 1 application topically as needed.   magnesium hydroxide (MILK OF MAGNESIA) 400 MG/5ML suspension Take 30 mLs by mouth at bedtime.    Allergies  Allergen Reactions   Codeine Other (See Comments)    "Walking the walls"   Morphine And Related Other (See Comments)    "Walking the walls"   Sulfa Antibiotics Rash    SOCIAL HISTORY/FAMILY HISTORY   Reviewed in Epic:  Pertinent findings:  Social History   Tobacco Use   Smoking status: Never    Smokeless tobacco: Never  Vaping Use   Vaping Use: Never used  Substance Use Topics   Alcohol use: Yes    Alcohol/week: 1.0 standard drink    Types: 1 Cans of beer per week    Comment: rarely   Drug use: No   Social History   Social History Narrative   Not on file    OBJCTIVE -PE, EKG, labs   Wt Readings from Last 3 Encounters:  02/28/22 285 lb (129.3 kg)  08/18/21 295 lb 6.4 oz (134 kg)  07/09/21 297 lb 12.8 oz (135.1 kg)    Physical Exam: BP 132/74   Pulse 79   Ht '6\' 2"'$  (1.88 m)   Wt 285 lb (129.3 kg)   SpO2 94%   BMI 36.59 kg/m  Physical Exam Constitutional:      General: He is not in acute distress.    Appearance: He is obese. He is not ill-appearing (Does not look as healthy as last check).     Comments: Mildly disheveled.  Notably less obese than before.  HENT:     Head: Normocephalic and atraumatic.     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 No carotid bruit (Radiated AOV murmur), hepatojugular reflux or JVD (8 cm water).  Cardiovascular:     Rate and Rhythm: Normal rate and regular rhythm. Occasional Extrasystoles are present.    Pulses: Decreased pulses (Palpable pedal pulses).     Heart sounds: S1 normal and S2 normal. Murmur heard.  Medium-pitched harsh crescendo-decrescendo  mid to late systolic murmur is present with a grade of 3/6 at the upper right sternal border radiating to the neck.    No friction rub. No gallop.  Pulmonary:     Effort: Pulmonary effort is normal. No respiratory distress.     Breath sounds: No wheezing, rhonchi or rales.     Comments: Mild interstitial sounds. Chest:     Chest wall: No tenderness.  Musculoskeletal:        General: Swelling (Trivial ankle) present.     Cervical back: Normal range of motion and neck supple.  Skin:    General: Skin is warm and dry.     Coloration: Skin is not jaundiced.  Neurological:     General: No focal deficit present.      Mental Status: He is alert and oriented to person, place, and time.     Cranial Nerves: No cranial nerve deficit.     Motor: Weakness present.     Gait: Gait abnormal.     Comments: However he did not recognize me.  And per his daughter, does not know his grandson's name.  Psychiatric:     Comments: Mood seems somewhat down.  Understands his age and does not seem interested in being aggressive.  He seems to understand the severity of the condition, not sure.     Adult ECG Report  Rate: 79 ;  Rhythm: normal sinus rhythm and Normal axis, normal durations. ;   Narrative Interpretation: Normal  Recent Labs:  Reviewed -- ? Lipids checked in April 2023 - but only TG available - 253; TSH 5.06  07/02/2021 Na+ 144, K+ 4.2, Cl- 107, HCO3-29, BUN 16, Cr 1.26, Glu 99, Ca2+ 9.7; AST 34, ALT 22, AlkP 47; Alb 3.6. CBC: W 8.1, H/H 14.4/43.2, Plt 329; TSH 5.63 (H) TC 109, TG 158, HDL 42, LDL 35-excellent;  No results found for: CHOL, HDL, LDLCALC, LDLDIRECT, TRIG, CHOLHDL    No results found for: HGBA1C No results found for: TSH  ==================================================  COVID-19 Education: The signs and symptoms of COVID-19 were discussed with the patient and how to seek care for testing (follow up with PCP or arrange E-visit).    I spent a total of 26 minutes with the patient spent in direct patient consultation.  Additional time spent with chart review  / charting (studies, outside notes, etc): 36 min => I personally reviewed both echoes.  I have reviewed the images and the calculations with progression error stenosis.  Discussed results with weaning colleagues. Total Time: 62 min  Current medicines are reviewed at length with the patient today.  (+/- concerns) - n/a  This visit occurred during the SARS-CoV-2 public health emergency.  Safety protocols were in place, including screening questions prior to the visit, additional usage of staff PPE, and extensive cleaning of exam room  while observing appropriate contact time as indicated for disinfecting solutions.  Notice: This dictation was prepared with Dragon dictation along with smart phrase technology. Any transcriptional errors that result from this process are unintentional and may not be corrected upon review.  Studies Ordered:   Orders Placed This Encounter  Procedures   EKG 12-Lead   No orders of the defined types were placed in this encounter.   Patient Instructions / Medication Changes & Studies & Tests Ordered   Patient Instructions  Medication Instructions:   No changes *If you need a refill on your cardiac medications before your next appointment, please call your pharmacy*   Lab Work:  Not needed   Testing/Procedures:  Not needed  Follow-Up: At [facility], you and your health needs are our priority.  As part of our continuing mission to provide you with exceptional heart care, we have created designated Provider Care Teams.  These Care Teams include your primary Cardiologist (physician) and Advanced Practice Providers (APPs -  Physician Assistants and Nurse Practitioners) who all work together to provide you with the care you need, when you need it.     Your next appointment:   2 month(s)  The format for your next appointment:   In Person  Provider:   Glenetta Hew, MD         Glenetta Hew, M.D., M.S. Interventional Cardiologist   Pager # [phone] Phone # [phone] [address] [place], [place] [address]   Thank you for choosing Heartcare at [facility]!!

## 2022-03-01 ENCOUNTER — Telehealth: Payer: Self-pay | Admitting: Cardiology

## 2022-03-01 DIAGNOSIS — R0609 Other forms of dyspnea: Secondary | ICD-10-CM

## 2022-03-01 DIAGNOSIS — I350 Nonrheumatic aortic (valve) stenosis: Secondary | ICD-10-CM

## 2022-03-01 NOTE — Telephone Encounter (Signed)
Patient's daughter is calling stating the patient wants to go ahead and do the procedure discussed at his appointment yesterday. She is requesting a callback from the nurse to discuss getting the testing done that is needed prior.  ?

## 2022-03-01 NOTE — Telephone Encounter (Signed)
Patient's wife returning call. 

## 2022-03-01 NOTE — Telephone Encounter (Signed)
Called spoke to patient 's daughter-Josettie. Daughter states patient would like to proceed with Right and left heart cath. RN informed daughter will schedule procedure. ?

## 2022-03-01 NOTE — Telephone Encounter (Signed)
Left message to call back  

## 2022-03-01 NOTE — Telephone Encounter (Signed)
OK  - then we should plan to schedule [facility]HC & send referral to TAVR TEam: ? ?Glenetta Hew, MD ? ?

## 2022-03-01 NOTE — Telephone Encounter (Signed)
Returned call to daughter-OV yesterday with Dr. Ellyn Hack, discussed possible TAVR.   ? ?Patient discussed with daughter and would like to proceed with workup.  Advised would make MD and primary nurse aware.   ?

## 2022-03-01 NOTE — Telephone Encounter (Signed)
RN called daughter . Informed  daughter the procedure is schedule for 03/09/22 at 8:30 am  will need to be at hospital at 6:30 am. ? ?Instruction will be given to patient on Friday 03/04/22  when patient has labs done  CBC, BMP ?Daughter verbalized understanding. ? ? ?

## 2022-03-02 NOTE — Telephone Encounter (Signed)
Good timing ?DH ?

## 2022-03-04 NOTE — Telephone Encounter (Signed)
Patient and Daughter are here at the office to have labs drawn for upcoming right and left heart cath. Instruction sheet given to daughter. She verbalized understanding of instructions.  Daughter states she spoke to Angelena Form PA-C  about whether or not to proceed with cath on Mar 09, 2022 prior to appointment with TAVR-    He is set up to see Dr. Ali Lowe on 5/26. Thank you for the referral.   KT  Daughter states she and father would like to proceed . She did say  he has for got some the conversation  in the past.  Rn informed her if they would like to postpone procedure just call the office next week. She verbalized understanding.

## 2022-03-05 LAB — BASIC METABOLIC PANEL
BUN/Creatinine Ratio: 9 — ABNORMAL LOW (ref 10–24)
BUN: 11 mg/dL (ref 8–27)
CO2: 25 mmol/L (ref 20–29)
Calcium: 9.8 mg/dL (ref 8.6–10.2)
Chloride: 105 mmol/L (ref 96–106)
Creatinine, Ser: 1.21 mg/dL (ref 0.76–1.27)
Glucose: 113 mg/dL — ABNORMAL HIGH (ref 70–99)
Potassium: 4.7 mmol/L (ref 3.5–5.2)
Sodium: 143 mmol/L (ref 134–144)
eGFR: 58 mL/min/{1.73_m2} — ABNORMAL LOW (ref >59)

## 2022-03-05 LAB — CBC
Hematocrit: 43.6 % (ref 37.5–51.0)
Hemoglobin: 15.3 g/dL (ref 13.0–17.7)
MCH: 32.7 pg (ref 26.6–33.0)
MCHC: 35.1 g/dL (ref 31.5–35.7)
MCV: 93 fL (ref 79–97)
Platelets: 402 10*3/uL (ref 150–450)
RBC: 4.68 x10E6/uL (ref 4.14–5.80)
RDW: 13 % (ref 11.6–15.4)
WBC: 8.2 10*3/uL (ref 3.4–10.8)

## 2022-03-08 ENCOUNTER — Telehealth: Payer: Self-pay | Admitting: *Deleted

## 2022-03-08 NOTE — Telephone Encounter (Signed)
RN Cath Lab requests patient arrive 5:30 AM instead of 6:30 AM, pt's daughter, Lorrine Kin,  states pt cannot arrive at 5:30 AM 03/09/22.

## 2022-03-08 NOTE — Telephone Encounter (Signed)
Cardiac Catheterization scheduled at [facility] for: Wednesday Mar 09, 2022 8:30 AM Arrival time and place: [facility] Main Entrance A at: 6:30 AM   Nothing to eat after midnight prior to procedure, clear liquids until 5 AM day of procedure.  Medication instructions: -Usual morning medications can be taken with sips of water including aspirin 81 mg.  Confirmed patient has responsible adult to drive home post procedure and be with patient first 24 hours after arriving home.  Patient reports no new symptoms concerning for COVID-19/no exposure to COVID-19 in the past 10 days.  Reviewed procedure instructions with patient's daughter, Lorrine Kin.

## 2022-03-09 ENCOUNTER — Ambulatory Visit (HOSPITAL_COMMUNITY)
Admission: RE | Admit: 2022-03-09 | Discharge: 2022-03-09 | Disposition: A | Discharge status: Home / Self Care | Payer: Medicare Other | Attending: Cardiology | Admitting: Cardiology

## 2022-03-09 ENCOUNTER — Encounter (HOSPITAL_COMMUNITY)
Admission: RE | Disposition: A | Discharge status: Home / Self Care | Payer: Self-pay | Source: Home / Self Care | Attending: Cardiology

## 2022-03-09 DIAGNOSIS — I2510 Atherosclerotic heart disease of native coronary artery without angina pectoris: Secondary | ICD-10-CM | POA: Insufficient documentation

## 2022-03-09 DIAGNOSIS — J449 Chronic obstructive pulmonary disease, unspecified: Secondary | ICD-10-CM | POA: Insufficient documentation

## 2022-03-09 DIAGNOSIS — Z6836 Body mass index (BMI) 36.0-36.9, adult: Secondary | ICD-10-CM | POA: Diagnosis not present

## 2022-03-09 DIAGNOSIS — I350 Nonrheumatic aortic (valve) stenosis: Secondary | ICD-10-CM

## 2022-03-09 HISTORY — PX: RIGHT/LEFT HEART CATH AND CORONARY ANGIOGRAPHY: CATH118266

## 2022-03-09 LAB — POCT I-STAT EG7
Acid-Base Excess: 0 mmol/L (ref 0.0–2.0)
Acid-Base Excess: 0 mmol/L (ref 0.0–2.0)
Acid-Base Excess: 0 mmol/L (ref 0.0–2.0)
Bicarbonate: 24.4 mmol/L (ref 20.0–28.0)
Bicarbonate: 25.8 mmol/L (ref 20.0–28.0)
Bicarbonate: 25.6 mmol/L (ref 20.0–28.0)
Calcium, Ion: 1.24 mmol/L (ref 1.15–1.40)
Calcium, Ion: 1.25 mmol/L (ref 1.15–1.40)
Calcium, Ion: 1.23 mmol/L (ref 1.15–1.40)
HCT: 39 % (ref 39.0–52.0)
HCT: 40 % (ref 39.0–52.0)
HCT: 39 % (ref 39.0–52.0)
Hemoglobin: 13.3 g/dL (ref 13.0–17.0)
Hemoglobin: 13.6 g/dL (ref 13.0–17.0)
Hemoglobin: 13.3 g/dL (ref 13.0–17.0)
O2 Saturation: 73 %
O2 Saturation: 99 %
O2 Saturation: 73 %
Potassium: 3.8 mmol/L (ref 3.5–5.1)
Potassium: 3.9 mmol/L (ref 3.5–5.1)
Potassium: 3.9 mmol/L (ref 3.5–5.1)
Sodium: 142 mmol/L (ref 135–145)
Sodium: 142 mmol/L (ref 135–145)
Sodium: 143 mmol/L (ref 135–145)
TCO2: 26 mmol/L (ref 22–32)
TCO2: 27 mmol/L (ref 22–32)
TCO2: 27 mmol/L (ref 22–32)
pCO2, Ven: 38.4 mmHg — ABNORMAL LOW (ref 44–60)
pCO2, Ven: 43.2 mmHg — ABNORMAL LOW (ref 44–60)
pCO2, Ven: 43.3 mmHg — ABNORMAL LOW (ref 44–60)
pH, Ven: 7.384 (ref 7.25–7.43)
pH, Ven: 7.412 (ref 7.25–7.43)
pH, Ven: 7.381 (ref 7.25–7.43)
pO2, Ven: 117 mmHg — ABNORMAL HIGH (ref 32–45)
pO2, Ven: 39 mmHg (ref 32–45)
pO2, Ven: 40 mmHg (ref 32–45)

## 2022-03-09 SURGERY — RIGHT/LEFT HEART CATH AND CORONARY ANGIOGRAPHY
Anesthesia: LOCAL

## 2022-03-09 MED ORDER — ACETAMINOPHEN 325 MG PO TABS: 650.0000 mg | ORAL_TABLET | ORAL | Status: DC | PRN

## 2022-03-09 MED ORDER — LIDOCAINE HCL (PF) 1 % IJ SOLN
INTRAMUSCULAR | Status: AC
  Filled 2022-03-09: qty 30

## 2022-03-09 MED ORDER — ONDANSETRON HCL 4 MG/2ML IJ SOLN: 4.0000 mg | Freq: Four times a day (QID) | INTRAMUSCULAR | Status: DC | PRN

## 2022-03-09 MED ORDER — HEPARIN SODIUM (PORCINE) 1000 UNIT/ML IJ SOLN
INTRAMUSCULAR | Status: DC | PRN
  Administered 2022-03-09: 6000 [IU] via INTRAVENOUS

## 2022-03-09 MED ORDER — SODIUM CHLORIDE 0.9 % IV SOLN: 250.0000 mL | INTRAVENOUS | Status: DC | PRN

## 2022-03-09 MED ORDER — HEPARIN SODIUM (PORCINE) 1000 UNIT/ML IJ SOLN
INTRAMUSCULAR | Status: AC
  Filled 2022-03-09: qty 10

## 2022-03-09 MED ORDER — SODIUM CHLORIDE 0.9 % WEIGHT BASED INFUSION: 1.0000 mL/kg/h | INTRAVENOUS | Status: DC

## 2022-03-09 MED ORDER — LABETALOL HCL 5 MG/ML IV SOLN: 10.0000 mg | INTRAVENOUS | Status: DC | PRN

## 2022-03-09 MED ORDER — SODIUM CHLORIDE 0.9% FLUSH
3.0000 mL | INTRAVENOUS | Status: DC | PRN
Start: 2022-03-09 — End: 2022-03-09

## 2022-03-09 MED ORDER — VERAPAMIL HCL 2.5 MG/ML IV SOLN
INTRAVENOUS | Status: AC
  Filled 2022-03-09: qty 2

## 2022-03-09 MED ORDER — HEPARIN (PORCINE) IN NACL 1000-0.9 UT/500ML-% IV SOLN
INTRAVENOUS | Status: DC | PRN
  Administered 2022-03-09 (×2): 500 mL

## 2022-03-09 MED ORDER — VERAPAMIL HCL 2.5 MG/ML IV SOLN
INTRAVENOUS | Status: DC | PRN
  Administered 2022-03-09: 10 mL via INTRA_ARTERIAL

## 2022-03-09 MED ORDER — IOHEXOL 350 MG/ML SOLN
INTRAVENOUS | Status: DC | PRN
  Administered 2022-03-09: 55 mL

## 2022-03-09 MED ORDER — SODIUM CHLORIDE 0.9 % WEIGHT BASED INFUSION
3.0000 mL/kg/h | INTRAVENOUS | Status: AC
  Administered 2022-03-09: 3 mL/kg/h via INTRAVENOUS

## 2022-03-09 MED ORDER — HYDRALAZINE HCL 20 MG/ML IJ SOLN: 10.0000 mg | INTRAMUSCULAR | Status: DC | PRN

## 2022-03-09 MED ORDER — SODIUM CHLORIDE 0.9% FLUSH: 3.0000 mL | INTRAVENOUS | Status: DC | PRN

## 2022-03-09 MED ORDER — SODIUM CHLORIDE 0.9% FLUSH: 3.0000 mL | Freq: Two times a day (BID) | INTRAVENOUS | Status: DC

## 2022-03-09 MED ORDER — LIDOCAINE HCL (PF) 1 % IJ SOLN
INTRAMUSCULAR | Status: DC | PRN
Start: 2022-03-09 — End: 2022-03-09
  Administered 2022-03-09: 5 mL

## 2022-03-09 MED ORDER — ASPIRIN 81 MG PO CHEW: 81.0000 mg | CHEWABLE_TABLET | ORAL | Status: DC

## 2022-03-09 MED ORDER — HEPARIN (PORCINE) IN NACL 1000-0.9 UT/500ML-% IV SOLN
INTRAVENOUS | Status: AC
  Filled 2022-03-09: qty 1000

## 2022-03-09 SURGICAL SUPPLY — 17 items
BAND CMPR LRG ZPHR (HEMOSTASIS) ×1
BAND ZEPHYR COMPRESS 30 LONG (HEMOSTASIS) ×1 IMPLANT
CATH 5FR JL3.5 JR4 ANG PIG MP (CATHETERS) ×1 IMPLANT
CATH BALLN WEDGE 5F 110CM (CATHETERS) ×1 IMPLANT
CATH INFINITI 5FR AL1 (CATHETERS) ×1 IMPLANT
GLIDESHEATH SLEND A-KIT 6F 22G (SHEATH) ×1 IMPLANT
GLIDESHEATH SLEND SS 6F .021 (SHEATH) ×1 IMPLANT
GUIDEWIRE .025 260CM (WIRE) ×1 IMPLANT
GUIDEWIRE INQWIRE 1.5J.035X260 (WIRE) IMPLANT
INQWIRE 1.5J .035X260CM (WIRE) ×2
KIT HEART LEFT (KITS) ×2 IMPLANT
PACK CARDIAC CATHETERIZATION (CUSTOM PROCEDURE TRAY) ×2 IMPLANT
SHEATH GLIDE SLENDER 4/5FR (SHEATH) ×1 IMPLANT
SHEATH PROBE COVER 6X72 (BAG) ×1 IMPLANT
TRANSDUCER W/STOPCOCK (MISCELLANEOUS) ×2 IMPLANT
TUBING CIL FLEX 10 FLL-RA (TUBING) ×2 IMPLANT
WIRE EMERALD ST .035X150CM (WIRE) ×1 IMPLANT

## 2022-03-09 NOTE — Brief Op Note (Signed)
   BRIEF CARDIAC CATHETERIZATION NOTE  03/09/2022 9:37 AM  PRIMARY CARE PHYSICIAN: Deland Pretty, MD PRIMARY CARDIOLOGIST: Glenetta Hew, MD   PROCEDURE:  Procedure(s): RIGHT/LEFT HEART CATH AND CORONARY ANGIOGRAPHY (N/A)  SURGEON:  Surgeon(s) and Role:    * Leonie Man, MD - Primary  PATIENT:  Raymond Cuevas  88 y.o. male With PMH Notable for Progression to Severe Aortic Stenosis with underlying history of COPD, HTN, HLD recently seen in clinic on Feb 28, 2022 for discussion of echocardiogram findings showing progression to severe aortic stenosis with a mean gradient of 44 mmHg.  Long discussion with patient and daughter about whether or not he would want to proceed with catheterization and TAVR evaluation.  Initially indicated that he did not want to proceed, however upon conversation with family at home, they decided to go ahead and proceed with at least the first part of the evaluation.  He therefore presents for right and left heart catheterization.  PRE-OPERATIVE DIAGNOSIS:  Severe aortic stenosis  POST-OPERATIVE DIAGNOSIS:   Severe Aortic Stenosis by Echocardiogram Confirmed by Cath: P-P gradient 53.1 mmHg, mean 42.3 mmHg Normal Right Heart Cath numbers with PCWP and LVEDP of roughly 15 mmHg.  Mean PAP 13 mmHg Angiographically minimal CAD with a Right Dominant System   PROCEDURE PERFORMED Time Out: Verified patient identification, verified procedure, site/side was marked, verified correct patient position, special equipment/implants available, medications/allergies/relevent history reviewed, required imaging and test results available. Performed.  Access:  RIGT ULNAR Artery: 6 Fr sheath -- Seldinger technique using AngioCath Kit (unable to advance a wire into the radial artery, therefore ulnar artery use) Direct ultrasound guidance used.  Permanent image obtained and placed on chart. 10 mL radial cocktail IA; 6000 units IV Heparin  * Right Brachial/Antecubital Vein:  The existing 18-gauge IV was exchanged over a wire for a 5Fr  sheath  Right Heart Catheterization: 5 Fr Gordy Councilman catheter advanced under fluoroscopy with balloon inflated to the RA, RV, then PCWP-PA for hemodynamic measurement.  * Simultaneous FA & PA blood gases checked for SaO2% to calculate FICK CO/CI  * Catheter removed completely out of the body with balloon deflated.  Left Heart Catheterization: 5Fr Catheters advanced or exchanged over a J-wire under direct fluoroscopic guidance into the ascending aorta; JR 4 catheter advanced first.  * LV Hemodynamics (no LV Gram): AL-1 * Left Coronary Artery Cineangiography: JL 3.5 Catheter  * Right Coronary Artery Cineangiography: JR4 Catheter   Upon completion of Angiogaphy, the catheter was removed completely out of the body over a wire, without complication.  Brachial Sheath(s) removed in the Cath Lab with manual pressure for hemostasis.    Radial sheath removed in the Cardiac Catheterization lab with TR Band placed for hemostasis.  TR Band: 0920  Hours; 12 mL air  MEDICATIONS * SQ Lidocaine 60mL * Radial Cocktail: 3 mg Verapmil in 10 mL NS * Isovue Contrast: 45 Ml * Heparin: 6000 units  ANESTHESIA:   local; no sedation  EBL:  minimal  CONTRAST: 45 mL  TOURNIQUET:  Zephyr Band R Ulnar - 12 mL air  DICTATION: .Note written in EPIC  PLAN OF CARE: Discharge to home after PACU; continue with referral to TAVR team  PATIENT DISPOSITION:  PACU - hemodynamically stable.   Delay start of Pharmacological VTE agent (>24hrs) due to surgical blood loss or risk of bleeding: not applicable   Glenetta Hew, MD

## 2022-03-09 NOTE — Interval H&P Note (Signed)
History and Physical Interval Note:  03/09/2022 8:19 AM  Raymond Cuevas  has presented today for surgery, with the diagnosis of aortic stenosis.   After going home, the patient discussed with his family and decided that he did not want to proceed with the initial part of the evaluation for TAVR.  He therefore presents for right left heart catheterization as part of TAVR evaluation.   The various methods of treatment have been discussed with the patient and family. After consideration of risks, benefits and other options for treatment, the patient has consented to  Procedure(s): RIGHT/LEFT HEART CATH AND CORONARY ANGIOGRAPHY (N/A) as a surgical intervention.  The patient's history has been reviewed, patient examined, no change in status, stable for surgery.  I have reviewed the patient's chart and labs.  Questions were answered to the patient's satisfaction.     Glenetta Hew

## 2022-03-09 NOTE — Discharge Instructions (Signed)

## 2022-03-10 ENCOUNTER — Encounter (HOSPITAL_COMMUNITY): Payer: Self-pay | Admitting: Cardiology

## 2022-03-11 ENCOUNTER — Ambulatory Visit (INDEPENDENT_AMBULATORY_CARE_PROVIDER_SITE_OTHER): Payer: Medicare Other | Admitting: Internal Medicine

## 2022-03-11 ENCOUNTER — Encounter: Payer: Self-pay | Admitting: Internal Medicine

## 2022-03-11 VITALS — BP 122/58 | HR 62 | Ht 74.0 in | Wt 286.0 lb

## 2022-03-11 DIAGNOSIS — I350 Nonrheumatic aortic (valve) stenosis: Secondary | ICD-10-CM | POA: Diagnosis not present

## 2022-03-11 DIAGNOSIS — Z6836 Body mass index (BMI) 36.0-36.9, adult: Secondary | ICD-10-CM | POA: Diagnosis not present

## 2022-03-11 DIAGNOSIS — F0390 Unspecified dementia without behavioral disturbance: Secondary | ICD-10-CM

## 2022-03-11 NOTE — Patient Instructions (Addendum)
Medication Instructions:  No changes *If you need a refill on your cardiac medications before your next appointment, please call your pharmacy*   Lab Work: none If you have labs (blood work) drawn today and your tests are completely normal, you will receive your results only by: [place] (if you have MyChart) OR A paper copy in the mail If you have any lab test that is abnormal or we need to change your treatment, we will call you to review the results.   Testing/Procedures: none   Follow-Up: In 3 months as noted below. You have been referred to [facility]   Important Information About Sugar

## 2022-03-11 NOTE — Progress Notes (Signed)
Patient ID: Raymond Cuevas MRN: [id number] DOB/AGE: 86-Nov-1935 86 y.o.  Primary Care Physician:Pharr, Thayer Jew, MD Primary Cardiologist: Glenetta Hew, MD   FOCUSED CARDIOVASCULAR PROBLEM LIST:   1.  Moderate to severe aortic stenosis with an aortic valve area of 1.05 cm, mean gradient of 44 mmHg, and peak velocity of 4.3 m/s with a normal ejection fraction; no conduction abnormalities 2.  Hypertension 3.  Hyperlipidemia 4.  BMI of 36 5.  Possible dementia   HISTORY OF PRESENT ILLNESS: The patient is a 88 y.o. male with the indicated medical history here for recommendations regarding his severe symptomatic aortic stenosis.  He was seen by Dr. Ellyn Hack earlier this month.  He was complaining of dyspnea.  He had his aortic valve interrogated invasively which demonstrated a mean gradient of over 40 mmHg.  His recently performed echocardiogram was also consistent with severe aortic stenosis.  The patient is here with his daughter.  He is very hard of hearing.  He typically stays at home and walks around his house from various rooms but is mostly sedentary.  He does get short of breath when he ambulates.  He denies any shortness of breath at rest.  He denies any exertional angina, presyncope, or syncope.  Fortunately he has not required hospitalizations or emergency room visits for breathing issues.  He has had no signs or symptoms of stroke.  His daughter is very worried about his mental status.  She feels that he may be developing dementia.  He has become forgetful at times.  After his cardiac catheterization by Dr. Ellyn Hack he was very agitated in the holding area following the procedure.  She became very worried about his mental status.  He does not smoke or drink.  He has both upper and lower dentures.  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 => TTE 02/02/2022: EF> 75%.  Moderate LVH.  GR 1 DD.  Normal RV and RVP.  Severe AS.  Mean gradient 44 mmHg up from 37 mmHg    Past Surgical History:  Procedure Laterality Date   CATARACT EXTRACTION, BILATERAL Bilateral    CHOLECYSTECTOMY     laparoscopic   EYE SURGERY Left    hole repair   JOINT REPLACEMENT Right    Knee   NM MYOVIEW LTD  11/2016    normal EF (55 to 65%).  No ischemia or infarction.  LOW RISK.   REPLACEMENT TOTAL KNEE     RIGHT/LEFT HEART CATH AND CORONARY ANGIOGRAPHY N/A 03/09/2022   Procedure: RIGHT/LEFT HEART CATH AND CORONARY ANGIOGRAPHY;  Surgeon: Leonie Man, MD;  Location: [facility];  Service: Cardiovascular;  Laterality: N/A;   TONSILLECTOMY     TOTAL HIP ARTHROPLASTY Right 08/27/2013   Procedure: RIGHT TOTAL HIP ARTHROPLASTY ANTERIOR APPROACH;  Surgeon: Mauri Pole, MD;  Location: [facility];  Service: Orthopedics;  Laterality: Right;   TRANSTHORACIC ECHOCARDIOGRAM  12/2017   a) 3/'19: Mild LVH.  Nl EF 55-60%.  GR 1 DD.  No RWMA.  Mild AS (MG 21 mm); Normal LV size, fxn w/o WMA.  Mod Conc VH.  EF 55-60%.  GR 1 DD.  NlRV size & fxn. Mod AS (MG  21 mmHg); b) 12/2019: Mild AS - MG 21 mmHg; c) 07/2021: EF 60-65%. No RWMA. Mod AS (MG 35 mmHg, PG 37 mmHg)   TRANSTHORACIC ECHOCARDIOGRAM  02/02/2022   EF> 75%.  Moderate LVH.  GR 1 DD.  Normal RV and RVP.  Severe aortic valve calcification with SEVERE AS.  MG 44 mmHg up from 37 mmHg; AVA by VTI 1.05 cm.    Family History  Problem Relation Age of Onset   Sudden death Sister 59    Social History   Socioeconomic History   Marital status: Widowed    Spouse name: Not on file   Number of children: Not on file   Years of education: Not on file    Highest education level: Not on file  Occupational History   Not on file  Tobacco Use   Smoking status: Never   Smokeless tobacco: Never  Vaping Use   Vaping Use: Never used  Substance and Sexual Activity   Alcohol use: Yes    Alcohol/week: 1.0 standard drink    Types: 1 Cans of beer per week    Comment: rarely   Drug use: No   Sexual activity: Never    Birth control/protection: None  Other Topics Concern   Not on file  Social History Narrative   Not on file   Social Determinants of Health   Financial Resource Strain: Not on file  Food Insecurity: Not on file  Transportation Needs: Not on file  Physical Activity: Not on file  Stress: Not on file  Social Connections: Not on file  Intimate Partner Violence: Not on file     Prior to Admission medications   Medication Sig Start Date End Date Taking? Authorizing Provider  albuterol (VENTOLIN HFA) 108 (90 Base) MCG/ACT inhaler INHALE 2 PUFFS BY MOUTH AS NEEDED 4 TIMES DAILY UNTIL WHEEZING EXTINGUISHED. RINSE AND SPIT AFTER USE. DEMONSTRATE PROPER TECHNIQUE INHALATI 08/18/21   Mannam, Praveen, MD  Ascorbic Acid (VITAMIN C) 1000 MG tablet Take 1,000 mg by mouth daily.    [provider]  aspirin EC 81 MG tablet Take 81 mg by mouth daily.    [provider]  budesonide-formoterol (SYMBICORT) 160-4.5 MCG/ACT inhaler Inhale 2 puffs into the lungs in the morning and at bedtime. 08/18/21   Marshell Garfinkel, MD  Calcium Carb-Cholecalciferol 600-20 MG-MCG TABS Take 1 tablet by mouth daily.    [provider]  levothyroxine (SYNTHROID) 25 MCG tablet Take 25 mcg by mouth daily before breakfast. 07/06/21   [provider]  losartan (COZAAR) 25 MG tablet Take 25 mg by mouth daily.    [provider]  magnesium hydroxide (MILK OF MAGNESIA) 400 MG/5ML suspension Take 30 mLs by mouth daily as needed for moderate constipation or heartburn.    [provider]  omeprazole (PRILOSEC OTC) 20 MG  tablet Take 20 mg by mouth daily.    [provider]  rosuvastatin (CRESTOR) 10 MG tablet Take 10 mg by mouth daily.    [provider]  vitamin B-12 (CYANOCOBALAMIN) 1000 MCG tablet Take 1,000 mcg by mouth daily.    [provider]    Allergies  Allergen Reactions   Codeine Other (See Comments)    "Walking the walls"   Morphine And Related Other (See Comments)    "Walking the walls"   Sulfa Antibiotics Rash    REVIEW OF SYSTEMS:  General: no fevers/chills/night sweats Eyes: no blurry vision, diplopia, or amaurosis ENT: no sore throat or hearing loss Resp: no cough, wheezing, or hemoptysis CV: no edema or palpitations GI: no abdominal pain, nausea, vomiting, diarrhea, or constipation GU: no dysuria, frequency, or hematuria Skin: no rash Neuro: no headache, numbness, tingling, or weakness of extremities Musculoskeletal: no joint pain or swelling Heme: no bleeding, DVT, or easy bruising Endo: no polydipsia or polyuria  BP (!) 122/58   Pulse 62   Ht '6\' 2"'$  (1.88 m)   Wt 286 lb (129.7 kg)   SpO2 95%   BMI 36.72 kg/m   PHYSICAL EXAM: GEN:  AO x 3 in no acute distress HEENT: normal Dentition: Dentures Neck: JVP normal. +1 carotid upstrokes without bruits. No thyromegaly. Lungs: equal expansion, clear bilaterally CV: Apex is discrete and nondisplaced, RRR with 3/6 systolic murmur Abd: soft, non-tender, non-distended; no bruit; positive bowel sounds Ext: no edema, ecchymoses, or cyanosis Vascular: 2+ femoral pulses, 2+ radial pulses       Skin: warm and dry without rash Neuro: CN II-XII grossly intact; motor and sensory grossly intact    DATA AND STUDIES:  EKG: Normal sinus rhythm  2D ECHO: Echocardiogram from April 2023 that I reviewed demonstrates a heavily calcified aortic valve with indices consistent with severe aortic stenosis with a preserved ejection fraction.  CARDIAC CATH: Minimal obstructive coronary artery disease and mean  gradient across the aortic valve of 42 mmHg  STS RISK CALCULATOR: Pending  NHYA CLASS: 2    ASSESSMENT AND PLAN:   Severe aortic stenosis by prior echocardiogram  BMI 36.0-36.9,adult  Dementia, unspecified dementia severity, unspecified dementia type, unspecified whether behavioral, psychotic, or mood disturbance or anxiety ([place]) - Plan: Ambulatory referral to Neurology   I had a long conversation with the patient and her daughter.  He has developed some dyspnea with exertion but this is not lifestyle limiting as he is relatively sedentary.  We reviewed the specifics regarding the pathophysiology of aortic stenosis and what he may expect moving forward.  At this point in time he is reluctant to undergo the further evaluation because he does not feel to poorly.  I think this is a reasonable decision in this particular individual.  Because of concerns about dementia I will refer him to neurology for further recommendations.  I will help monitor him along with Dr. Ellyn Hack.  I will see him back in 3 months time to see how he is doing.  I have asked him and his daughter to contact us if his exertional dyspnea becomes much more severe, he develops exertional angina, presyncope, or syncope.  He understands and agrees with this plan.  I have personally reviewed the patients imaging data as summarized above.  I have reviewed the natural history of aortic stenosis with the patient and family members who are present today. We have discussed the limitations of medical therapy and the poor prognosis associated with symptomatic aortic stenosis. We have also reviewed potential treatment options, including palliative medical therapy, conventional surgical aortic valve replacement, and transcatheter aortic valve replacement. We discussed treatment options in the context of this patient's specific comorbid medical conditions.   All of the patient's questions were answered today. Will make further  recommendations based on the results of studies outlined above.   Total time spent with patient today 60 minutes. This includes reviewing records, evaluating the patient and coordinating care.   Early Osmond, MD  03/11/2022 3:03 PM    [facility] [place], [place], [place]  [address] Phone: [phone]; Fax: [phone]

## 2022-03-18 ENCOUNTER — Ambulatory Visit: Payer: Medicare Other | Admitting: Adult Health

## 2022-04-05 ENCOUNTER — Ambulatory Visit (INDEPENDENT_AMBULATORY_CARE_PROVIDER_SITE_OTHER): Payer: Medicare Other | Admitting: Pulmonary Disease

## 2022-04-05 ENCOUNTER — Encounter: Payer: Self-pay | Admitting: Pulmonary Disease

## 2022-04-05 VITALS — BP 122/60 | HR 65 | Ht 74.0 in | Wt 285.4 lb

## 2022-04-05 DIAGNOSIS — R0602 Shortness of breath: Secondary | ICD-10-CM | POA: Diagnosis not present

## 2022-04-05 MED ORDER — IPRATROPIUM-ALBUTEROL 0.5-2.5 (3) MG/3ML IN SOLN: 3.0000 mL | Freq: Four times a day (QID) | RESPIRATORY_TRACT | 11 refills | Status: DC | PRN

## 2022-04-05 NOTE — Patient Instructions (Signed)
I am glad your breathing is stable We will stop the Symbicort as you are not using it on a regular basis Will order nebulizer and DuoNebs from the DME company.  Use up to 4 times a day as needed for dyspnea Follow-up in 6 months.

## 2022-04-05 NOTE — Progress Notes (Signed)
Raymond Cuevas    [id number]    02-26-1934  Primary Care Physician:Pharr, Thayer Jew, MD  Referring Physician: Deland Pretty, MD [place],  [place] [address]  Chief complaint: Follow-up for asthma  HPI: 88 year old with history of GERD, allergic rhinitis.  Seen by primary care on 01/15/18 for cough, wheezing, diagnosed with asthmatic bronchitis and treated with azithromycin and steroids.  Chest x-ray some mild scarring at the base with no acute process. He states that his breathing is much improved today.  Denies any cough, sputum production, wheezing He has been outside doing yard work and mowing the lawn yesterday without any shortness of breath or respiratory issues  He had a CT angio in March 2019 which did not show any pulmonary embolism or acute lung abnormality.  There were 2 subcentimeter pulmonary nodules in the right lower lobe and left lower lobe.  Seen back in pulmonary clinic in 2021 after a gap of 2 years for increasing dyspnea on exertion.  States that his neighbors do not let him do outside yard work due to concern for his breathing. Started on Symbicort inhaler  Pets: No pets.  He has a bird feeder in front yard and is exposed to wild birds Occupation: Retired Administrator Exposures: No dampness, mold , jacuzzi,, hot tub Smoking history: Never smoker Travel history: Grew up in [place].  No significant travel  Interim history: He has been prescribed Symbicort inhaler but is not using it.  Even when he uses it he does not inhale correctly Daughter who is here with him tells me that he is getting more forgetful with dementia symptoms and is due to see neurology shortly He had a recent right and left heart cath which showed severe aortic stenosis.  He was seen by Dr. Ali Lowe but likely is not a candidate for TAVR given advanced age and dementia.  Outpatient Encounter Medications as of 04/05/2022  Medication Sig   albuterol (VENTOLIN  HFA) 108 (90 Base) MCG/ACT inhaler INHALE 2 PUFFS BY MOUTH AS NEEDED 4 TIMES DAILY UNTIL WHEEZING EXTINGUISHED. RINSE AND SPIT AFTER USE. DEMONSTRATE PROPER TECHNIQUE INHALATI   Ascorbic Acid (VITAMIN C) 1000 MG tablet Take 1,000 mg by mouth daily.   aspirin EC 81 MG tablet Take 81 mg by mouth daily.   budesonide-formoterol (SYMBICORT) 160-4.5 MCG/ACT inhaler Inhale 2 puffs into the lungs in the morning and at bedtime.   Calcium Carb-Cholecalciferol 600-20 MG-MCG TABS Take 1 tablet by mouth daily.   levothyroxine (SYNTHROID) 25 MCG tablet Take 25 mcg by mouth daily before breakfast.   losartan (COZAAR) 25 MG tablet Take 25 mg by mouth daily.   magnesium hydroxide (MILK OF MAGNESIA) 400 MG/5ML suspension Take 30 mLs by mouth daily as needed for moderate constipation or heartburn.   omeprazole (PRILOSEC OTC) 20 MG tablet Take 20 mg by mouth daily.   rosuvastatin (CRESTOR) 10 MG tablet Take 10 mg by mouth daily.   vitamin B-12 (CYANOCOBALAMIN) 1000 MCG tablet Take 1,000 mcg by mouth daily.   No facility-administered encounter medications on file as of 04/05/2022.   Physical Exam: Blood pressure 124/66, pulse 81, temperature 97.8 F (36.6 C), temperature source Oral, height '6\' 2"'$  (1.88 m), weight 295 lb 6.4 oz (134 kg), SpO2 93 %. Gen:      No acute distress HEENT:  EOMI, sclera anicteric Neck:     No masses; no thyromegaly Lungs:    Clear to auscultation bilaterally; normal  respiratory effort CV:         Regular rate and rhythm; no murmurs Abd:      + bowel sounds; soft, non-tender; no palpable masses, no distension Ext:    No edema; adequate peripheral perfusion Skin:      Warm and dry; no rash Neuro: alert and oriented x 3 Psych: normal mood and affect   Data Reviewed: Imaging: Chest x-ray 01/15/17 Mild scarring at the left base, no edema or consolidation.  Stable cardiac silhouette.  CT chest 01/08/2018 No acute lung process.  Mild atelectasis at the bases.  4 mm right lower lobe, 5 mm  left lower lobe nodule.  CT chest 05/03/2019-stable pulmonary nodules  Chest x-ray report from primary care 03/25/2020-no acute abnormality.  Chest x-ray 05/06/2020-low lung volumes with atelectasis at the bases. I have reviewed the images personally.  PFTs 03/15/2018 FVC 3.86 [86%], FEV1 2.99 [94%), F/F 78, TLC 91%, DLCO 69%, DLCO/VA 83% No obstruction or restriction.  Mild diffusion defect that corrects for alveolar volume  06/30/2020 FVC 3.19 [72%], FEV1 2.1 [71%], F/F 69 Normal spirometry  FENO 02/09/2018-19   Labs CBC 02/09/2018-WBC 5.9, eos 2.9%, absolute eosinophil count 200 Blood allergy profile/26/19-IgE 20, RAST panel shows mild allergies to tree pollen  Assessment:  Mild asthma PFTs reviewed with no obstruction or eosinophilia He is not using the Symbicort regularly and will discontinue the medication.  He has difficulty with coordination due to dementia Order DuoNebs 4 times a day as needed Suspect weight loss and deconditioning playing a role in presentation Advised diet and exercise  Plan/Recommendations: Discontinue Symbicort, start DuoNebs Weight loss with diet and exercise  Raymond Garfinkel MD [facility] 04/05/2022, 11:08 AM  CC: Deland Pretty, MD

## 2022-04-07 ENCOUNTER — Ambulatory Visit: Payer: Medicare Other | Admitting: Physician Assistant

## 2022-04-13 ENCOUNTER — Encounter: Payer: Self-pay | Admitting: Neurology

## 2022-04-13 ENCOUNTER — Telehealth: Payer: Self-pay | Admitting: Neurology

## 2022-04-13 ENCOUNTER — Ambulatory Visit (INDEPENDENT_AMBULATORY_CARE_PROVIDER_SITE_OTHER): Payer: Medicare Other | Admitting: Neurology

## 2022-04-13 VITALS — BP 127/74 | HR 86 | Ht 74.0 in | Wt 283.5 lb

## 2022-04-13 DIAGNOSIS — F02B0 Dementia in other diseases classified elsewhere, moderate, without behavioral disturbance, psychotic disturbance, mood disturbance, and anxiety: Secondary | ICD-10-CM

## 2022-04-13 DIAGNOSIS — G301 Alzheimer's disease with late onset: Secondary | ICD-10-CM

## 2022-04-13 MED ORDER — DONEPEZIL HCL 10 MG PO TABS: 10.0000 mg | ORAL_TABLET | Freq: Every day | ORAL | 12 refills | Status: DC

## 2022-04-13 MED ORDER — MEMANTINE HCL 10 MG PO TABS: 10.0000 mg | ORAL_TABLET | Freq: Two times a day (BID) | ORAL | 12 refills | Status: DC

## 2022-04-13 NOTE — Progress Notes (Signed)
[facility]  PATIENT: Raymond Cuevas DOB: 1934-06-30  REQUESTING CLINICIAN: Early Osmond, MD HISTORY FROM: Patient and daughter  REASON FOR VISIT: Worsening memory    HISTORICAL  CHIEF COMPLAINT:  Chief Complaint  Patient presents with   New Patient (Initial Visit)    Rm 15. Accompanied by daughter, Gilberto Better. NP/internal referral for dementia.    HISTORY OF PRESENT ILLNESS:  This is a 88 year old male with past medical history of valvular heart disease, lung disease, hypothyroidism, hypertension, and hyperlipidemia who is presenting with memory decline for the past 2 years.  Daughter who accompanied patient today reports that  91-month ago she was called by patient primary care doctor and [facility].  [place]  Since leaving with patient in the past 152-monthshe noted that his memory is very poor.  Patient is forgetful, forget about recent conversation, forgets about name, sometimes will forget about family members.  He has not been driving because at 1 point he got lost and called daughter for directions and it was the last time he drove and this was a year ago.  Daughter reports he forgets to take his medication, forget about doctor's appointment, does not remember what was his recent doctor's recommendations and at this point it is getting very difficult for her to take care of his father.   She reports his behavior is worse, he is very confrontational and argumentative.  His sleep pattern is disrupted he does go to bed very late and sleep most of the morning time.  He does not do much other than sit and watch TV, he does drink a lot of iced tea sodas and alcohol also   TBI:   No past history of TBI Stroke:   no past history of stroke Seizures:   no past history of seizures Sleep:   no history of sleep apnea.   Mood:   patient denies anxiety and depression  Functional  status: Dependent in some ADLs and IADLs Patient lives with daughter. Cooking: Does not cook, wants sandwich, crackers, icecream Cleaning: No  Shopping: Does not do groceries  Bathing: Needs reminder  Toileting: Needs reminder  Driving: not driving anymore, has been lost driving to familiar place Bills: Still pays them   Ever left the stove on by accident?: Denies  Forget how to use items around the house?: yes  Getting lost going to familiar places?: Yes  Forgetting loved ones names?: Yes  Word finding difficulty? Denies Sleep: Sleep is disturbed    OTHER MEDICAL CONDITIONS: Valvular Heart disease, lung disease, hypothyroidism, Vitamin B12 deficiency, Hyperlipidemia    REVIEW OF SYSTEMS: Full 14 system review of systems performed and negative with exception of: unable to fully obtain, due to memory issues   ALLERGIES: Allergies  Allergen Reactions   Codeine Other (See Comments)    "Walking the walls"   Morphine And Related Other (See Comments)    "Walking the walls"   Sulfa Antibiotics Rash    HOME MEDICATIONS: Outpatient Medications Prior to Visit  Medication Sig Dispense Refill   albuterol (VENTOLIN HFA) 108 (90 Base) MCG/ACT inhaler INHALE 2 PUFFS BY MOUTH AS NEEDED 4 TIMES DAILY UNTIL WHEEZING EXTINGUISHED. RINSE AND SPIT AFTER USE. DEMONSTRATE PROPER TECHNIQUE INHALATI 18 g 5   Ascorbic Acid (VITAMIN C) 1000 MG tablet Take 1,000 mg by mouth daily.     aspirin EC 81 MG  tablet Take 81 mg by mouth daily.     Calcium Carb-Cholecalciferol 600-20 MG-MCG TABS Take 1 tablet by mouth daily.     levothyroxine (SYNTHROID) 25 MCG tablet Take 25 mcg by mouth daily before breakfast.     losartan (COZAAR) 25 MG tablet Take 25 mg by mouth daily.     magnesium hydroxide (MILK OF MAGNESIA) 400 MG/5ML suspension Take 30 mLs by mouth daily as needed for moderate constipation or heartburn.     omeprazole (PRILOSEC OTC) 20 MG tablet Take 20 mg by mouth daily.     rosuvastatin (CRESTOR) 10  MG tablet Take 10 mg by mouth daily.     vitamin B-12 (CYANOCOBALAMIN) 1000 MCG tablet Take 1,000 mcg by mouth daily.     ipratropium-albuterol (DUONEB) 0.5-2.5 (3) MG/3ML SOLN Take 3 mLs by nebulization every 6 (six) hours as needed. (Patient not taking: Reported on 04/13/2022) 360 mL 11   budesonide-formoterol (SYMBICORT) 160-4.5 MCG/ACT inhaler Inhale 2 puffs into the lungs in the morning and at bedtime. 10.2 g 5   No facility-administered medications prior to visit.    PAST MEDICAL HISTORY: Past Medical History:  Diagnosis Date   COPD (chronic obstructive pulmonary disease) ([place])    Dementia due to general medical condition without behavioral disturbance (HCC)    GERD (gastroesophageal reflux disease)    occ.   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 => TTE 02/02/2022: EF> 75%.  Moderate LVH.  GR 1 DD.  Normal RV and RVP.  Severe AS.  Mean gradient 44 mmHg up from 37 mmHg    PAST SURGICAL HISTORY: Past Surgical History:  Procedure Laterality Date   CATARACT EXTRACTION, BILATERAL Bilateral    CHOLECYSTECTOMY     laparoscopic   EYE SURGERY Left    hole repair   JOINT REPLACEMENT Right    Knee   NM MYOVIEW LTD  11/2016    normal EF (55 to 65%).  No ischemia or infarction.  LOW RISK.   REPLACEMENT TOTAL KNEE     RIGHT/LEFT HEART CATH AND CORONARY ANGIOGRAPHY N/A 03/09/2022   Procedure: RIGHT/LEFT HEART CATH AND CORONARY ANGIOGRAPHY;  Surgeon: Leonie Man, MD;  Location: [facility];  Service: Cardiovascular;  Laterality: N/A;   TONSILLECTOMY     TOTAL HIP ARTHROPLASTY Right 08/27/2013   Procedure: RIGHT TOTAL HIP ARTHROPLASTY ANTERIOR APPROACH;  Surgeon: Mauri Pole, MD;  Location: [facility];  Service: Orthopedics;  Laterality: Right;   TRANSTHORACIC ECHOCARDIOGRAM  12/2017   a) 3/'19:  Mild LVH.  Nl EF 55-60%.  GR 1 DD.  No RWMA.  Mild AS (MG 21 mm); Normal LV size, fxn w/o WMA.  Mod Conc VH.  EF 55-60%.  GR 1 DD.  NlRV size & fxn. Mod AS (MG  21 mmHg); b) 12/2019: Mild AS - MG 21 mmHg; c) 07/2021: EF 60-65%. No RWMA. Mod AS (MG 35 mmHg, PG 37 mmHg)   TRANSTHORACIC ECHOCARDIOGRAM  02/02/2022   EF> 75%.  Moderate LVH.  GR 1 DD.  Normal RV and RVP.  Severe aortic valve calcification with SEVERE AS.  MG 44 mmHg up from 37 mmHg; AVA by VTI 1.05 cm.    FAMILY HISTORY: Family History  Problem Relation Age of Onset   Sudden death Sister 60    SOCIAL HISTORY: Social  History   Socioeconomic History   Marital status: Widowed    Spouse name: Not on file   Number of children: Not on file   Years of education: Not on file   Highest education level: Not on file  Occupational History   Not on file  Tobacco Use   Smoking status: Never   Smokeless tobacco: Never  Vaping Use   Vaping Use: Never used  Substance and Sexual Activity   Alcohol use: Yes    Alcohol/week: 1.0 standard drink of alcohol    Types: 1 Cans of beer per week    Comment: rarely   Drug use: No   Sexual activity: Never    Birth control/protection: None  Other Topics Concern   Not on file  Social History Narrative   Morning coffee (when up)   Social Determinants of Health   Financial Resource Strain: Not on file  Food Insecurity: Not on file  Transportation Needs: Not on file  Physical Activity: Not on file  Stress: Not on file  Social Connections: Not on file  Intimate Partner Violence: Not on file    PHYSICAL EXAM  GENERAL EXAM/CONSTITUTIONAL: Vitals:  Vitals:   04/13/22 1301  BP: 127/74  Pulse: 86  Weight: 283 lb 8 oz (128.6 kg)  Height: '6\' 2"'$  (1.88 m)   Body mass index is 36.4 kg/m. Wt Readings from Last 3 Encounters:  04/13/22 283 lb 8 oz (128.6 kg)  04/05/22 285 lb 6.4 oz (129.5 kg)  03/11/22 286 lb (129.7 kg)   Patient is in no distress; well developed, nourished and  groomed; neck is supple  EYES: Pupils round and reactive to light, Visual fields full to confrontation, Extraocular movements intacts,   MUSCULOSKELETAL: Gait, strength, tone, movements noted in Neurologic exam below  NEUROLOGIC: MENTAL STATUS:      No data to display            04/13/2022    1:03 PM  Montreal Cognitive Assessment   Visuospatial/ Executive (0/5) 2  Naming (0/3) 3  Attention: Read list of digits (0/2) 2  Attention: Read list of letters (0/1) 1  Attention: Serial 7 subtraction starting at 100 (0/3) 3  Language: Repeat phrase (0/2) 1  Language : Fluency (0/1) 0  Abstraction (0/2) 2  Delayed Recall (0/5) 0  Orientation (0/6) 6  Total 20  Adjusted Score (based on education) 21     CRANIAL NERVE:  2nd, 3rd, 4th, 6th - pupils equal and reactive to light, visual fields full to confrontation, extraocular muscles intact, no nystagmus 5th - facial sensation symmetric 7th - facial strength symmetric 8th - hearing intact 9th - palate elevates symmetrically, uvula midline 11th - shoulder shrug symmetric 12th - tongue protrusion midline  MOTOR:  normal bulk and tone, full strength in the BUE, BLE  SENSORY:  normal and symmetric to light touch  COORDINATION:  finger-nose-finger, fine finger movements normal  REFLEXES:  deep tendon reflexes present and symmetric  GAIT/STATION:  Ambulates with a cane     DIAGNOSTIC DATA (LABS, IMAGING, TESTING) - I reviewed patient records, labs, notes, testing and imaging myself where available.  Lab Results  Component Value Date   WBC 8.2 03/04/2022   HGB 13.3 03/09/2022   HCT 39.0 03/09/2022   MCV 93 03/04/2022   PLT 402 03/04/2022      Component Value Date/Time   NA 142 03/09/2022 0902   NA 143 03/04/2022 1418   K 3.9 03/09/2022 0902   CL 105  03/04/2022 1418   CO2 25 03/04/2022 1418   GLUCOSE 113 (H) 03/04/2022 1418   GLUCOSE 95 09/12/2016 1507   BUN 11 03/04/2022 1418   CREATININE 1.21 03/04/2022  1418   CALCIUM 9.8 03/04/2022 1418   PROT 6.3 11/18/2008 0553   ALBUMIN 3.5 11/18/2008 0553   AST 23 11/18/2008 0553   ALT 25 11/18/2008 0553   ALKPHOS 38 (L) 11/18/2008 0553   BILITOT 0.8 11/18/2008 0553   GFRNONAA 80 (L) 08/29/2013 0425   GFRAA >90 08/29/2013 0425   No results found for: "CHOL", "HDL", "LDLCALC", "LDLDIRECT", "TRIG", "CHOLHDL" No results found for: "HGBA1C" No results found for: "VITAMINB12" No results found for: "TSH"  Reported last TSH and vitamin B12 done at his primary care doctor was within normal limits, he is on Synthroid 25 mcg and cyanocobalamin 1000 mcg daily.   ASSESSMENT AND PLAN  88 y.o. year old male with medical conditions including Hypertension, hyperlipidemia, valvular heart disease, lung disease and hypothyroidism who is presenting with complaint of memory deficit for the past 2 years at least described as forgetting recent conversation, forgetting to take her medications and at some time forgetting to take care of himself needing reminders.  He is currently not driving because he was lost last time he drove and had to call his daughter and asked for direction.  On exam today he scored 21 out of 30 on the Mini-Mental status exam corresponding with memory decline.  Based on history and exam, patient likely has dementia, Alzheimer type.  I will start him on Namenda 10 mg twice daily and also given a prescription for Aricept but I did advise daughter to wait for a month prior to starting the medication.  The goal is for patient to be on both medications.  Discussed side effect of the medication including diarrhea and dizziness.  I will also obtain a head CT for further study and referred patient to home service as daughter is now having difficulty taking care of patient due to her own health issues.  Follow up in a year.    1. Moderate late onset Alzheimer's dementia without behavioral disturbance, psychotic disturbance, mood disturbance, or anxiety (HCC)       Patient Instructions  Continue your other medications as prescribed  Start with Namenda 10 mg twice daily  In on month start Aricept 10 mg nightly  Head CT without contrast  Referral to home services  Return in 1 year     There are well-accepted and sensible ways to reduce risk for Alzheimers disease and other degenerative brain disorders .  Exercise Daily Walk A daily 20 minute walk should be part of your routine. Disease related apathy can be a significant roadblock to exercise and the only way to overcome this is to make it a daily routine and perhaps have a reward at the end (something your loved one loves to eat or drink perhaps) or a personal trainer coming to the home can also be very useful. Most importantly, the patient is much more likely to exercise if the caregiver / spouse does it with him/her. In general a structured, repetitive schedule is best.  General Health: Any diseases which effect your body will effect your brain such as a pneumonia, urinary infection, blood clot, heart attack or stroke. Keep contact with your primary care doctor for regular follow ups.  Sleep. A good nights sleep is healthy for the brain. Seven hours is recommended. If you have insomnia or poor sleep habits we  can give you some instructions. If you have sleep apnea wear your mask.  Diet: Eating a heart healthy diet is also a good idea; fish and poultry instead of red meat, nuts (mostly non-peanuts), vegetables, fruits, olive oil or canola oil (instead of butter), minimal salt (use other spices to flavor foods), whole grain rice, bread, cereal and pasta and wine in moderation.Research is now showing that the MIND diet, which is a combination of The Mediterranean diet and the DASH diet, is beneficial for cognitive processing and longevity. Information about this diet can be found in The MIND Diet, a book by Doyne Keel, MS, RDN, and online at [employer]bookDist[employer].si  Finances,  Power of Attorney and Advance Directives: You should consider putting legal safeguards in place with regard to financial and medical decision making. While the spouse always has power of attorney for medical and financial issues in the absence of any form, you should consider what you want in case the spouse / caregiver is no longer around or capable of making decisions.   Orders Placed This Encounter  Procedures   CT HEAD WO CONTRAST (5MM)   Ambulatory referral to [place] ordered this encounter  Medications   memantine (NAMENDA) 10 MG tablet    Sig: Take 1 tablet (10 mg total) by mouth 2 (two) times daily.    Dispense:  60 tablet    Refill:  12   donepezil (ARICEPT) 10 MG tablet    Sig: Take 1 tablet (10 mg total) by mouth at bedtime.    Dispense:  30 tablet    Refill:  12    Return in about 1 year (around 04/14/2023).  I have spent a total of 65 minutes dedicated to this patient today, preparing to see patient, performing a medically appropriate examination and evaluation, ordering tests and/or medications and procedures, and counseling and educating the patient/family/caregiver; independently interpreting result and communicating results to the family/patient/caregiver; and documenting clinical information in the electronic medical record.   Alric Ran, MD 04/13/2022, 4:00 PM  [facility] [address] [place], [place] [address] [phone]

## 2022-04-13 NOTE — Patient Instructions (Addendum)
Continue your other medications as prescribed  Start with Namenda 10 mg twice daily  In on month start Aricept 10 mg nightly  Head CT without contrast  Referral to home services  Return in 1 year     There are well-accepted and sensible ways to reduce risk for Alzheimers disease and other degenerative brain disorders .  Exercise Daily Walk A daily 20 minute walk should be part of your routine. Disease related apathy can be a significant roadblock to exercise and the only way to overcome this is to make it a daily routine and perhaps have a reward at the end (something your loved one loves to eat or drink perhaps) or a personal trainer coming to the home can also be very useful. Most importantly, the patient is much more likely to exercise if the caregiver / spouse does it with him/her. In general a structured, repetitive schedule is best.  General Health: Any diseases which effect your body will effect your brain such as a pneumonia, urinary infection, blood clot, heart attack or stroke. Keep contact with your primary care doctor for regular follow ups.  Sleep. A good nights sleep is healthy for the brain. Seven hours is recommended. If you have insomnia or poor sleep habits we can give you some instructions. If you have sleep apnea wear your mask.  Diet: Eating a heart healthy diet is also a good idea; fish and poultry instead of red meat, nuts (mostly non-peanuts), vegetables, fruits, olive oil or canola oil (instead of butter), minimal salt (use other spices to flavor foods), whole grain rice, bread, cereal and pasta and wine in moderation.Research is now showing that the MIND diet, which is a combination of The Mediterranean diet and the DASH diet, is beneficial for cognitive processing and longevity. Information about this diet can be found in The MIND Diet, a book by Doyne Keel, MS, RDN, and online at [employer]book[employer].si  Finances, Power of Attorney and Advance  Directives: You should consider putting legal safeguards in place with regard to financial and medical decision making. While the spouse always has power of attorney for medical and financial issues in the absence of any form, you should consider what you want in case the spouse / caregiver is no longer around or capable of making decisions.

## 2022-04-13 NOTE — Telephone Encounter (Signed)
medicare/[employer] NPR sent to GI

## 2022-04-14 ENCOUNTER — Telehealth: Payer: Self-pay | Admitting: Neurology

## 2022-04-14 NOTE — Telephone Encounter (Signed)
[facility] is taking this patient for home health.

## 2022-04-20 ENCOUNTER — Telehealth: Payer: Self-pay | Admitting: *Deleted

## 2022-04-20 NOTE — Telephone Encounter (Signed)
Spoke with daughter.  The July 7 appointment is not needed. With Dr Ellyn Hack. Patient is in the process of having work up  for TAVR with Dr Ali Lowe.   His next appointment will be 06/03/22 with Dr Ali Lowe. Daughter is in agreement . She states patient was sent to neuorolgy for evaluation.

## 2022-04-22 ENCOUNTER — Telehealth: Payer: Medicare Other | Admitting: Cardiology

## 2022-05-02 DIAGNOSIS — F02B4 Dementia in other diseases classified elsewhere, moderate, with anxiety: Secondary | ICD-10-CM | POA: Diagnosis not present

## 2022-05-02 DIAGNOSIS — Z7982 Long term (current) use of aspirin: Secondary | ICD-10-CM | POA: Diagnosis not present

## 2022-05-02 DIAGNOSIS — E785 Hyperlipidemia, unspecified: Secondary | ICD-10-CM | POA: Diagnosis not present

## 2022-05-02 DIAGNOSIS — M1990 Unspecified osteoarthritis, unspecified site: Secondary | ICD-10-CM | POA: Diagnosis not present

## 2022-05-02 DIAGNOSIS — G301 Alzheimer's disease with late onset: Secondary | ICD-10-CM | POA: Diagnosis not present

## 2022-05-02 DIAGNOSIS — M6289 Other specified disorders of muscle: Secondary | ICD-10-CM | POA: Diagnosis not present

## 2022-05-02 DIAGNOSIS — Z96641 Presence of right artificial hip joint: Secondary | ICD-10-CM | POA: Diagnosis not present

## 2022-05-02 DIAGNOSIS — E538 Deficiency of other specified B group vitamins: Secondary | ICD-10-CM | POA: Diagnosis not present

## 2022-05-02 DIAGNOSIS — E039 Hypothyroidism, unspecified: Secondary | ICD-10-CM | POA: Diagnosis not present

## 2022-05-02 DIAGNOSIS — J449 Chronic obstructive pulmonary disease, unspecified: Secondary | ICD-10-CM | POA: Diagnosis not present

## 2022-05-02 DIAGNOSIS — I350 Nonrheumatic aortic (valve) stenosis: Secondary | ICD-10-CM | POA: Diagnosis not present

## 2022-05-02 DIAGNOSIS — I10 Essential (primary) hypertension: Secondary | ICD-10-CM | POA: Diagnosis not present

## 2022-05-02 DIAGNOSIS — K219 Gastro-esophageal reflux disease without esophagitis: Secondary | ICD-10-CM | POA: Diagnosis not present

## 2022-05-03 DIAGNOSIS — I10 Essential (primary) hypertension: Secondary | ICD-10-CM | POA: Diagnosis not present

## 2022-05-03 DIAGNOSIS — E039 Hypothyroidism, unspecified: Secondary | ICD-10-CM | POA: Diagnosis not present

## 2022-05-03 DIAGNOSIS — E785 Hyperlipidemia, unspecified: Secondary | ICD-10-CM | POA: Diagnosis not present

## 2022-05-03 DIAGNOSIS — F02B4 Dementia in other diseases classified elsewhere, moderate, with anxiety: Secondary | ICD-10-CM | POA: Diagnosis not present

## 2022-05-03 DIAGNOSIS — I350 Nonrheumatic aortic (valve) stenosis: Secondary | ICD-10-CM | POA: Diagnosis not present

## 2022-05-03 DIAGNOSIS — G301 Alzheimer's disease with late onset: Secondary | ICD-10-CM | POA: Diagnosis not present

## 2022-05-04 DIAGNOSIS — E039 Hypothyroidism, unspecified: Secondary | ICD-10-CM | POA: Diagnosis not present

## 2022-05-04 DIAGNOSIS — G301 Alzheimer's disease with late onset: Secondary | ICD-10-CM | POA: Diagnosis not present

## 2022-05-04 DIAGNOSIS — I350 Nonrheumatic aortic (valve) stenosis: Secondary | ICD-10-CM | POA: Diagnosis not present

## 2022-05-04 DIAGNOSIS — F02B4 Dementia in other diseases classified elsewhere, moderate, with anxiety: Secondary | ICD-10-CM | POA: Diagnosis not present

## 2022-05-04 DIAGNOSIS — E785 Hyperlipidemia, unspecified: Secondary | ICD-10-CM | POA: Diagnosis not present

## 2022-05-04 DIAGNOSIS — I10 Essential (primary) hypertension: Secondary | ICD-10-CM | POA: Diagnosis not present

## 2022-05-05 DIAGNOSIS — G301 Alzheimer's disease with late onset: Secondary | ICD-10-CM | POA: Diagnosis not present

## 2022-05-05 DIAGNOSIS — I10 Essential (primary) hypertension: Secondary | ICD-10-CM | POA: Diagnosis not present

## 2022-05-05 DIAGNOSIS — E039 Hypothyroidism, unspecified: Secondary | ICD-10-CM | POA: Diagnosis not present

## 2022-05-05 DIAGNOSIS — F02B4 Dementia in other diseases classified elsewhere, moderate, with anxiety: Secondary | ICD-10-CM | POA: Diagnosis not present

## 2022-05-05 DIAGNOSIS — I350 Nonrheumatic aortic (valve) stenosis: Secondary | ICD-10-CM | POA: Diagnosis not present

## 2022-05-05 DIAGNOSIS — E785 Hyperlipidemia, unspecified: Secondary | ICD-10-CM | POA: Diagnosis not present

## 2022-05-10 DIAGNOSIS — I10 Essential (primary) hypertension: Secondary | ICD-10-CM | POA: Diagnosis not present

## 2022-05-10 DIAGNOSIS — F02B4 Dementia in other diseases classified elsewhere, moderate, with anxiety: Secondary | ICD-10-CM | POA: Diagnosis not present

## 2022-05-10 DIAGNOSIS — G301 Alzheimer's disease with late onset: Secondary | ICD-10-CM | POA: Diagnosis not present

## 2022-05-10 DIAGNOSIS — E785 Hyperlipidemia, unspecified: Secondary | ICD-10-CM | POA: Diagnosis not present

## 2022-05-10 DIAGNOSIS — I350 Nonrheumatic aortic (valve) stenosis: Secondary | ICD-10-CM | POA: Diagnosis not present

## 2022-05-10 DIAGNOSIS — E039 Hypothyroidism, unspecified: Secondary | ICD-10-CM | POA: Diagnosis not present

## 2022-05-12 ENCOUNTER — Ambulatory Visit
Admission: RE | Admit: 2022-05-12 | Discharge: 2022-05-12 | Disposition: A | Discharge status: Home / Self Care | Payer: Medicare Other | Source: Ambulatory Visit | Attending: Neurology | Admitting: Neurology

## 2022-05-12 DIAGNOSIS — G301 Alzheimer's disease with late onset: Secondary | ICD-10-CM

## 2022-05-12 DIAGNOSIS — I6782 Cerebral ischemia: Secondary | ICD-10-CM | POA: Diagnosis not present

## 2022-05-12 DIAGNOSIS — C719 Malignant neoplasm of brain, unspecified: Secondary | ICD-10-CM | POA: Diagnosis not present

## 2022-05-12 NOTE — Progress Notes (Signed)
Please call and advise the patient that the recent head CT showed no acute findings except for generalized atrophy that is consistent with his diagnoses of dementia.  No further action is required on this test at this time. Please remind patient to keep any upcoming appointments or tests and to call us with any interim questions, concerns, problems or updates. Thanks,   Alric Ran, MD

## 2022-05-13 ENCOUNTER — Telehealth: Payer: Self-pay | Admitting: Neurology

## 2022-05-13 DIAGNOSIS — G301 Alzheimer's disease with late onset: Secondary | ICD-10-CM | POA: Diagnosis not present

## 2022-05-13 DIAGNOSIS — F02B4 Dementia in other diseases classified elsewhere, moderate, with anxiety: Secondary | ICD-10-CM | POA: Diagnosis not present

## 2022-05-13 DIAGNOSIS — E039 Hypothyroidism, unspecified: Secondary | ICD-10-CM | POA: Diagnosis not present

## 2022-05-13 DIAGNOSIS — E785 Hyperlipidemia, unspecified: Secondary | ICD-10-CM | POA: Diagnosis not present

## 2022-05-13 DIAGNOSIS — I350 Nonrheumatic aortic (valve) stenosis: Secondary | ICD-10-CM | POA: Diagnosis not present

## 2022-05-13 DIAGNOSIS — I10 Essential (primary) hypertension: Secondary | ICD-10-CM | POA: Diagnosis not present

## 2022-05-13 NOTE — Telephone Encounter (Signed)
Pt's daughter, Anthone Prieur (on [place]) pt has gotten worse since starting medications prescribed. He is sleeping so much cannot answer the door for physical therapy when they come. Do not know if its the medication or if he has worsened. Would like a call from the nurse

## 2022-05-16 ENCOUNTER — Telehealth: Payer: Self-pay | Admitting: *Deleted

## 2022-05-16 NOTE — Telephone Encounter (Signed)
Please call patient and daughter and advised them to decrease the medication to 1/2 tablet nightly.   Dr. April Manson

## 2022-05-16 NOTE — Telephone Encounter (Signed)
-----   Message from Alric Ran, MD sent at 05/12/2022  8:14 PM EDT ----- Please call and advise the patient that the recent head CT showed no acute findings except for generalized atrophy that is consistent with his diagnoses of dementia.  No further action is required on this test at this time. Please remind patient to keep any upcoming appointments or tests and to call us with any interim questions, concerns, problems or updates. Thanks,   Alric Ran, MD

## 2022-05-16 NOTE — Telephone Encounter (Signed)
I spoke with the patient's daughter, Gilberto Better. I provided the results to her. She verbalized understanding of the findings and expressed appreciation for the call.

## 2022-05-16 NOTE — Telephone Encounter (Signed)
I left a message for the patient's daughter, Gilberto Better for a return call.

## 2022-05-16 NOTE — Telephone Encounter (Signed)
I left a VM for the patient's daughter, Gilberto Better (as per DPR). I provided the office number for a return call.

## 2022-05-16 NOTE — Telephone Encounter (Signed)
I spoke with the patient's daughter, Gilberto Better. She stated the patient appears to be overly sedated since starting Aricept 10 mg.   The patient was doing well during the first week of Aricept. On the patient's second week of taking the medication his daughter received daily calls from home health. Home health stated that the patient was not answering calls and was not getting up to answer the door.   When the patient's daughter went to check on him she found him still asleep at approximately 1:30 PM. He has regularly been sleeping until the mid afternoon hours since beginning the medication. The patient's daughter says he appears sedated upon waking.  She is unsure what time he is taking his medication. She denies any other changes or side effects from the medication.

## 2022-05-17 NOTE — Telephone Encounter (Signed)
I attempted to contact the patient's daughter, Gilberto Better. I left a VM for a return call.

## 2022-05-17 NOTE — Telephone Encounter (Signed)
I called the patient's daughter, Gilberto Better. I informed her that the patient is to decrease the medication to 1/2 tablet nightly. She agreed to start him on the half tablet tonight. She verbalized understanding and appreciation for the call.

## 2022-05-19 DIAGNOSIS — E785 Hyperlipidemia, unspecified: Secondary | ICD-10-CM | POA: Diagnosis not present

## 2022-05-19 DIAGNOSIS — I10 Essential (primary) hypertension: Secondary | ICD-10-CM | POA: Diagnosis not present

## 2022-05-19 DIAGNOSIS — E039 Hypothyroidism, unspecified: Secondary | ICD-10-CM | POA: Diagnosis not present

## 2022-05-19 DIAGNOSIS — G301 Alzheimer's disease with late onset: Secondary | ICD-10-CM | POA: Diagnosis not present

## 2022-05-19 DIAGNOSIS — I350 Nonrheumatic aortic (valve) stenosis: Secondary | ICD-10-CM | POA: Diagnosis not present

## 2022-05-19 DIAGNOSIS — F02B4 Dementia in other diseases classified elsewhere, moderate, with anxiety: Secondary | ICD-10-CM | POA: Diagnosis not present

## 2022-05-20 ENCOUNTER — Telehealth: Payer: Self-pay | Admitting: Cardiology

## 2022-05-20 DIAGNOSIS — F02B4 Dementia in other diseases classified elsewhere, moderate, with anxiety: Secondary | ICD-10-CM | POA: Diagnosis not present

## 2022-05-20 DIAGNOSIS — I10 Essential (primary) hypertension: Secondary | ICD-10-CM | POA: Diagnosis not present

## 2022-05-20 DIAGNOSIS — I350 Nonrheumatic aortic (valve) stenosis: Secondary | ICD-10-CM | POA: Diagnosis not present

## 2022-05-20 DIAGNOSIS — E039 Hypothyroidism, unspecified: Secondary | ICD-10-CM | POA: Diagnosis not present

## 2022-05-20 DIAGNOSIS — E785 Hyperlipidemia, unspecified: Secondary | ICD-10-CM | POA: Diagnosis not present

## 2022-05-20 DIAGNOSIS — G301 Alzheimer's disease with late onset: Secondary | ICD-10-CM | POA: Diagnosis not present

## 2022-05-20 NOTE — Telephone Encounter (Signed)
Daughter returned RN's call to follow-up.

## 2022-05-20 NOTE — Telephone Encounter (Signed)
Louie Casa, home health nurse with [facility], calling to speak with Almyra Free.

## 2022-05-20 NOTE — Telephone Encounter (Signed)
Called daughter back- advised of discussion with [facility] nurse. She was advised I sent message to MD to review for any further recommendations.  She is aware, I will call back with response.   Patient daughter verbalized understanding

## 2022-05-20 NOTE — Telephone Encounter (Signed)
Call back with MD response.   Patient daughter verbalized understanding.

## 2022-05-20 NOTE — Telephone Encounter (Signed)
Spoke with daughter. Advised that she received information from the home health nurse who advised her his HR yesterday was 48. They denied symptoms. However she called today stating she wanted to know if anybody had sooner appointment. I advised how was he today, patient daughter states she spoke with him this morning and he was fine, however home health will be out there toady at 12:30-1:00. I advised her to have them contact us if they have any issues with his HR today and we could make MD's aware.   However, patient has upcoming appointment on 08/18- I advised to keep this as there was  no sooner appointment with either provider. Daughter verbalized understanding. She will have home health call us today with an update.  Will await update.

## 2022-05-20 NOTE — Telephone Encounter (Signed)
Raymond Cuevas with home health, he advised that patient was asymptomatic today- BP was 142/60- HR 51. Yesterday HR was 48.  [place] states he is taking all of his medications on his list.   Upcoming appointment with Dr.Thukkani on 08/18- will route to Dr.Thukkani to see if he has any recommendations as Dr.Harding is out of office.   Thanks!

## 2022-05-20 NOTE — Telephone Encounter (Signed)
  STAT if HR is under 50 or over 120 (normal HR is 60-100 beats per minute)  What is your heart rate? 48  Do you have a log of your heart rate readings (document readings)?   Do you have any other symptoms? Pt's daughter said, pt had a PT and OT visit yesterday and the home health nurse called her stating pt's HR is at 33 and advised to call his heart doctor. She would like to know what they need to do if pt needs sooner appt. She said, it was Dr. Ellyn Hack ordered the home health visit

## 2022-05-23 DIAGNOSIS — E039 Hypothyroidism, unspecified: Secondary | ICD-10-CM | POA: Diagnosis not present

## 2022-05-23 DIAGNOSIS — I10 Essential (primary) hypertension: Secondary | ICD-10-CM | POA: Diagnosis not present

## 2022-05-23 DIAGNOSIS — E785 Hyperlipidemia, unspecified: Secondary | ICD-10-CM | POA: Diagnosis not present

## 2022-05-23 DIAGNOSIS — F02B4 Dementia in other diseases classified elsewhere, moderate, with anxiety: Secondary | ICD-10-CM | POA: Diagnosis not present

## 2022-05-23 DIAGNOSIS — I350 Nonrheumatic aortic (valve) stenosis: Secondary | ICD-10-CM | POA: Diagnosis not present

## 2022-05-23 DIAGNOSIS — G301 Alzheimer's disease with late onset: Secondary | ICD-10-CM | POA: Diagnosis not present

## 2022-05-25 DIAGNOSIS — E785 Hyperlipidemia, unspecified: Secondary | ICD-10-CM | POA: Diagnosis not present

## 2022-05-25 DIAGNOSIS — I10 Essential (primary) hypertension: Secondary | ICD-10-CM | POA: Diagnosis not present

## 2022-05-25 DIAGNOSIS — F02B4 Dementia in other diseases classified elsewhere, moderate, with anxiety: Secondary | ICD-10-CM | POA: Diagnosis not present

## 2022-05-25 DIAGNOSIS — I350 Nonrheumatic aortic (valve) stenosis: Secondary | ICD-10-CM | POA: Diagnosis not present

## 2022-05-25 DIAGNOSIS — E039 Hypothyroidism, unspecified: Secondary | ICD-10-CM | POA: Diagnosis not present

## 2022-05-25 DIAGNOSIS — G301 Alzheimer's disease with late onset: Secondary | ICD-10-CM | POA: Diagnosis not present

## 2022-05-27 DIAGNOSIS — F02B4 Dementia in other diseases classified elsewhere, moderate, with anxiety: Secondary | ICD-10-CM | POA: Diagnosis not present

## 2022-05-27 DIAGNOSIS — I10 Essential (primary) hypertension: Secondary | ICD-10-CM | POA: Diagnosis not present

## 2022-05-27 DIAGNOSIS — G301 Alzheimer's disease with late onset: Secondary | ICD-10-CM | POA: Diagnosis not present

## 2022-05-27 DIAGNOSIS — I350 Nonrheumatic aortic (valve) stenosis: Secondary | ICD-10-CM | POA: Diagnosis not present

## 2022-05-27 DIAGNOSIS — E785 Hyperlipidemia, unspecified: Secondary | ICD-10-CM | POA: Diagnosis not present

## 2022-05-27 DIAGNOSIS — E039 Hypothyroidism, unspecified: Secondary | ICD-10-CM | POA: Diagnosis not present

## 2022-06-01 DIAGNOSIS — I350 Nonrheumatic aortic (valve) stenosis: Secondary | ICD-10-CM | POA: Diagnosis not present

## 2022-06-01 DIAGNOSIS — F02B4 Dementia in other diseases classified elsewhere, moderate, with anxiety: Secondary | ICD-10-CM | POA: Diagnosis not present

## 2022-06-01 DIAGNOSIS — Z7982 Long term (current) use of aspirin: Secondary | ICD-10-CM | POA: Diagnosis not present

## 2022-06-01 DIAGNOSIS — I10 Essential (primary) hypertension: Secondary | ICD-10-CM | POA: Diagnosis not present

## 2022-06-01 DIAGNOSIS — J449 Chronic obstructive pulmonary disease, unspecified: Secondary | ICD-10-CM | POA: Diagnosis not present

## 2022-06-01 DIAGNOSIS — Z96641 Presence of right artificial hip joint: Secondary | ICD-10-CM | POA: Diagnosis not present

## 2022-06-01 DIAGNOSIS — E538 Deficiency of other specified B group vitamins: Secondary | ICD-10-CM | POA: Diagnosis not present

## 2022-06-01 DIAGNOSIS — M1990 Unspecified osteoarthritis, unspecified site: Secondary | ICD-10-CM | POA: Diagnosis not present

## 2022-06-01 DIAGNOSIS — E785 Hyperlipidemia, unspecified: Secondary | ICD-10-CM | POA: Diagnosis not present

## 2022-06-01 DIAGNOSIS — K219 Gastro-esophageal reflux disease without esophagitis: Secondary | ICD-10-CM | POA: Diagnosis not present

## 2022-06-01 DIAGNOSIS — E039 Hypothyroidism, unspecified: Secondary | ICD-10-CM | POA: Diagnosis not present

## 2022-06-01 DIAGNOSIS — M6289 Other specified disorders of muscle: Secondary | ICD-10-CM | POA: Diagnosis not present

## 2022-06-01 DIAGNOSIS — G301 Alzheimer's disease with late onset: Secondary | ICD-10-CM | POA: Diagnosis not present

## 2022-06-01 NOTE — Progress Notes (Signed)
Patient ID: Raymond Cuevas MRN: [id number] DOB/AGE: Jul 20, 1934 88 y.o.  Primary Care Physician:Pharr, Thayer Jew, MD Primary Cardiologist: Glenetta Hew, MD   FOCUSED CARDIOVASCULAR PROBLEM LIST:   1.  Moderate to severe aortic stenosis with an aortic valve area of 1.05 cm, mean gradient of 44 mmHg, and peak velocity of 4.3 m/s with a normal ejection fraction; no conduction abnormalities 2.  Hypertension 3.  Hyperlipidemia 4.  BMI of 36 5.  Alzheimer's dementia   HISTORY OF PRESENT ILLNESS:  The patient returns for follow-up.  When I saw him in May he was feeling relatively well.  The biggest concern rather than aortic stenosis was his mental status and forgetfulness.  I referred him to neurology who started him Namenda as well as Aricept for treatment of likely Alzheimer's dementia.  The patient is here with his daughter.  It is clear that there is a great deal of conflict between the two due to caregiver stress.  The patient tells me that he denies any shortness of breath or chest pain.  He is able to do most of his activities of daily living.  He is very sedentary at baseline.  Apparently he has had issues with a variety of visiting nurses and therapists.  His daughter is really quite frustrated with his inability to comply with instructions from his care team.  His neurologist did say that he was unfit to drive.  She is concerned about him driving on occasion.  It is clear to me on my interview with the patient that he is quite confused and requires repetition and redirection.  Past Medical History:  Diagnosis Date   COPD (chronic obstructive pulmonary disease) ([place])    Dementia due to general medical condition without behavioral disturbance (HCC)    GERD (gastroesophageal reflux disease)    occ.   Hyperlipidemia LDL goal <100 09/25/2020   Hypertension    Osteoarthritis    S/P right THA, AA 08/27/2013   Severe aortic stenosis by prior echocardiogram 02/02/2022   TTE  (12/19/2019): Moderate AS (mean gradient 21 mmHg). => TTE 02/02/2022: EF> 75%.  Moderate LVH.  GR 1 DD.  Normal RV and RVP.  Severe AS.  Mean gradient 44 mmHg up from 37 mmHg    Past Surgical History:  Procedure Laterality Date   CATARACT EXTRACTION, BILATERAL Bilateral    CHOLECYSTECTOMY     laparoscopic   EYE SURGERY Left    hole repair   JOINT REPLACEMENT Right    Knee   NM MYOVIEW LTD  11/2016    normal EF (55 to 65%).  No ischemia or infarction.  LOW RISK.   REPLACEMENT TOTAL KNEE     RIGHT/LEFT HEART CATH AND CORONARY ANGIOGRAPHY N/A 03/09/2022   Procedure: RIGHT/LEFT HEART CATH AND CORONARY ANGIOGRAPHY;  Surgeon: Leonie Man, MD;  Location: [facility];  Service: Cardiovascular;  Laterality: N/A;   TONSILLECTOMY     TOTAL HIP ARTHROPLASTY Right 08/27/2013   Procedure: RIGHT TOTAL HIP ARTHROPLASTY ANTERIOR APPROACH;  Surgeon: Mauri Pole, MD;  Location: [facility];  Service: Orthopedics;  Laterality: Right;   TRANSTHORACIC ECHOCARDIOGRAM  12/2017   a) 3/'19: Mild LVH.  Nl EF 55-60%.  GR 1 DD.  No RWMA.  Mild AS (MG 21 mm); Normal LV size, fxn w/o WMA.  Mod Conc VH.  EF 55-60%.  GR 1 DD.  NlRV size & fxn. Mod AS (MG  21 mmHg); b) 12/2019: Mild AS - MG 21 mmHg; c) 07/2021:  EF 60-65%. No RWMA. Mod AS (MG 35 mmHg, PG 37 mmHg)   TRANSTHORACIC ECHOCARDIOGRAM  02/02/2022   EF> 75%.  Moderate LVH.  GR 1 DD.  Normal RV and RVP.  Severe aortic valve calcification with SEVERE AS.  MG 44 mmHg up from 37 mmHg; AVA by VTI 1.05 cm.    Family History  Problem Relation Age of Onset   Sudden death Sister 86    Social History   Socioeconomic History   Marital status: Widowed    Spouse name: Not on file   Number of children: Not on file   Years of education: Not on file   Highest education level: Not on file  Occupational History   Not on file  Tobacco Use   Smoking status: Never   Smokeless tobacco: Never  Vaping Use   Vaping Use: Never used  Substance and Sexual Activity    Alcohol use: Yes    Alcohol/week: 1.0 standard drink of alcohol    Types: 1 Cans of beer per week    Comment: rarely   Drug use: No   Sexual activity: Never    Birth control/protection: None  Other Topics Concern   Not on file  Social History Narrative   Morning coffee (when up)   Social Determinants of Health   Financial Resource Strain: Not on file  Food Insecurity: Not on file  Transportation Needs: Not on file  Physical Activity: Not on file  Stress: Not on file  Social Connections: Not on file  Intimate Partner Violence: Not on file     Prior to Admission medications   Medication Sig Start Date End Date Taking? Authorizing Provider  albuterol (VENTOLIN HFA) 108 (90 Base) MCG/ACT inhaler INHALE 2 PUFFS BY MOUTH AS NEEDED 4 TIMES DAILY UNTIL WHEEZING EXTINGUISHED. RINSE AND SPIT AFTER USE. DEMONSTRATE PROPER TECHNIQUE INHALATI 08/18/21   Mannam, Praveen, MD  Ascorbic Acid (VITAMIN C) 1000 MG tablet Take 1,000 mg by mouth daily.    [provider]  aspirin EC 81 MG tablet Take 81 mg by mouth daily.    [provider]  budesonide-formoterol (SYMBICORT) 160-4.5 MCG/ACT inhaler Inhale 2 puffs into the lungs in the morning and at bedtime. 08/18/21   Marshell Garfinkel, MD  Calcium Carb-Cholecalciferol 600-20 MG-MCG TABS Take 1 tablet by mouth daily.    [provider]  levothyroxine (SYNTHROID) 25 MCG tablet Take 25 mcg by mouth daily before breakfast. 07/06/21   [provider]  losartan (COZAAR) 25 MG tablet Take 25 mg by mouth daily.    [provider]  magnesium hydroxide (MILK OF MAGNESIA) 400 MG/5ML suspension Take 30 mLs by mouth daily as needed for moderate constipation or heartburn.    [provider]  omeprazole (PRILOSEC OTC) 20 MG tablet Take 20 mg by mouth daily.    [provider]  rosuvastatin (CRESTOR) 10 MG tablet Take 10 mg by mouth daily.    [provider]  vitamin B-12 (CYANOCOBALAMIN) 1000 MCG  tablet Take 1,000 mcg by mouth daily.    [provider]    Allergies  Allergen Reactions   Codeine Other (See Comments)    "Walking the walls"   Morphine And Related Other (See Comments)    "Walking the walls"   Sulfa Antibiotics Rash    REVIEW OF SYSTEMS:  General: no fevers/chills/night sweats Eyes: no blurry vision, diplopia, or amaurosis ENT: no sore throat or hearing loss Resp: no cough, wheezing, or hemoptysis CV: no edema or  palpitations GI: no abdominal pain, nausea, vomiting, diarrhea, or constipation GU: no dysuria, frequency, or hematuria Skin: no rash Neuro: no headache, numbness, tingling, or weakness of extremities Musculoskeletal: no joint pain or swelling Heme: no bleeding, DVT, or easy bruising Endo: no polydipsia or polyuria  BP 126/70   Pulse 92   Ht '6\' 2"'$  (1.88 m)   Wt 268 lb (121.6 kg)   BMI 34.41 kg/m   PHYSICAL EXAM: GEN:  AO x 3 in no acute distress HEENT: normal Dentition: Dentures Neck: JVP normal. +1 carotid upstrokes without bruits. No thyromegaly. Lungs: equal expansion, clear bilaterally CV: Apex is discrete and nondisplaced, RRR with 3/6 systolic murmur Abd: soft, non-tender, non-distended; no bruit; positive bowel sounds Ext: no edema, ecchymoses, or cyanosis Vascular: 2+ femoral pulses, 2+ radial pulses       Skin: warm and dry without rash Neuro: CN II-XII grossly intact; motor and sensory grossly intact    DATA AND STUDIES:  EKG: Normal sinus rhythm  2D ECHO: Echocardiogram from April 2023 that I reviewed demonstrates a heavily calcified aortic valve with indices consistent with severe aortic stenosis with a preserved ejection fraction.  CARDIAC CATH: Minimal obstructive coronary artery disease and mean gradient across the aortic valve of 42 mmHg  STS RISK CALCULATOR: Pending  NHYA CLASS: 2    ASSESSMENT AND PLAN:   Severe aortic stenosis by prior echocardiogram  BMI 36.0-36.9,adult  Dementia, unspecified  dementia severity, unspecified dementia type, unspecified whether behavioral, psychotic, or mood disturbance or anxiety ([place])  Given the patient's dementia I do not think that he is a candidate for transcatheter aortic valve replacement.  Went along discussion with he and his daughter being quite argumentative about this.  It was interesting to note that the patient did reiterate many things during the conversation that I just been talked about.  It is clear that his dementia is progressive.  We will communicate with the [facility] about his driving privileges which I think should be revoked.  I do not think he is a candidate for transcatheter aortic valve replacement.  No further follow-up with the structural heart disease clinic is required.  All of the patient's questions were answered today. Will make further recommendations based on the results of studies outlined above.   Total time spent with patient today 40 minutes. This includes reviewing records, evaluating the patient and coordinating care.   Early Osmond, MD  06/03/2022 2:33 PM    [facility] [address], [place], [place]  [address] Phone: [phone]; Fax: [phone]

## 2022-06-02 DIAGNOSIS — E039 Hypothyroidism, unspecified: Secondary | ICD-10-CM | POA: Diagnosis not present

## 2022-06-02 DIAGNOSIS — F02B4 Dementia in other diseases classified elsewhere, moderate, with anxiety: Secondary | ICD-10-CM | POA: Diagnosis not present

## 2022-06-02 DIAGNOSIS — I350 Nonrheumatic aortic (valve) stenosis: Secondary | ICD-10-CM | POA: Diagnosis not present

## 2022-06-02 DIAGNOSIS — I10 Essential (primary) hypertension: Secondary | ICD-10-CM | POA: Diagnosis not present

## 2022-06-02 DIAGNOSIS — G301 Alzheimer's disease with late onset: Secondary | ICD-10-CM | POA: Diagnosis not present

## 2022-06-02 DIAGNOSIS — E785 Hyperlipidemia, unspecified: Secondary | ICD-10-CM | POA: Diagnosis not present

## 2022-06-03 ENCOUNTER — Encounter: Payer: Self-pay | Admitting: Internal Medicine

## 2022-06-03 ENCOUNTER — Ambulatory Visit (INDEPENDENT_AMBULATORY_CARE_PROVIDER_SITE_OTHER): Payer: Medicare Other | Admitting: Internal Medicine

## 2022-06-03 VITALS — BP 126/70 | HR 92 | Ht 74.0 in | Wt 268.0 lb

## 2022-06-03 DIAGNOSIS — I350 Nonrheumatic aortic (valve) stenosis: Secondary | ICD-10-CM

## 2022-06-03 DIAGNOSIS — F0390 Unspecified dementia without behavioral disturbance: Secondary | ICD-10-CM | POA: Diagnosis not present

## 2022-06-03 DIAGNOSIS — G301 Alzheimer's disease with late onset: Secondary | ICD-10-CM | POA: Diagnosis not present

## 2022-06-03 DIAGNOSIS — Z6836 Body mass index (BMI) 36.0-36.9, adult: Secondary | ICD-10-CM | POA: Diagnosis not present

## 2022-06-03 DIAGNOSIS — E039 Hypothyroidism, unspecified: Secondary | ICD-10-CM | POA: Diagnosis not present

## 2022-06-03 DIAGNOSIS — I10 Essential (primary) hypertension: Secondary | ICD-10-CM | POA: Diagnosis not present

## 2022-06-03 DIAGNOSIS — F02B4 Dementia in other diseases classified elsewhere, moderate, with anxiety: Secondary | ICD-10-CM | POA: Diagnosis not present

## 2022-06-03 DIAGNOSIS — E785 Hyperlipidemia, unspecified: Secondary | ICD-10-CM | POA: Diagnosis not present

## 2022-06-03 NOTE — Patient Instructions (Signed)
Medication Instructions:  No changes *If you need a refill on your cardiac medications before your next appointment, please call your pharmacy*   Lab Work: none If you have labs (blood work) drawn today and your tests are completely normal, you will receive your results only by: [place] (if you have MyChart) OR A paper copy in the mail If you have any lab test that is abnormal or we need to change your treatment, we will call you to review the results.   Testing/Procedures: none   Follow-Up: As needed

## 2022-06-08 DIAGNOSIS — E785 Hyperlipidemia, unspecified: Secondary | ICD-10-CM | POA: Diagnosis not present

## 2022-06-08 DIAGNOSIS — I350 Nonrheumatic aortic (valve) stenosis: Secondary | ICD-10-CM | POA: Diagnosis not present

## 2022-06-08 DIAGNOSIS — G301 Alzheimer's disease with late onset: Secondary | ICD-10-CM | POA: Diagnosis not present

## 2022-06-08 DIAGNOSIS — F02B4 Dementia in other diseases classified elsewhere, moderate, with anxiety: Secondary | ICD-10-CM | POA: Diagnosis not present

## 2022-10-27 ENCOUNTER — Encounter: Payer: Self-pay | Admitting: Cardiology

## 2022-10-27 NOTE — Telephone Encounter (Signed)
Based upon my last note in May 2022 - plan was 2 month f/u.  HE was subsequently seen by TAVR Team MD on 2 occasions.  I am fine with him being added into my schedule whenver - I had expected him all fall.   [place]

## 2022-11-29 NOTE — Progress Notes (Unsigned)
Cardiology Clinic Note   Patient Name: Raymond Cuevas Date of Encounter: 11/30/2022  Primary Care Provider:  Deland Pretty, MD Primary Cardiologist:  Glenetta Hew, MD  Patient Profile    Raymond Cuevas 89 year old male presents the clinic today for follow-up evaluation of his hypertension and aortic stenosis.  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 => TTE 02/02/2022: EF> 75%.  Moderate LVH.  GR 1 DD.  Normal RV and RVP.  Severe AS.  Mean gradient 44 mmHg up from 37 mmHg   Past Surgical History:  Procedure Laterality Date   CATARACT EXTRACTION, BILATERAL Bilateral    CHOLECYSTECTOMY     laparoscopic   EYE SURGERY Left    hole repair   JOINT REPLACEMENT Right    Knee   NM MYOVIEW LTD  11/2016    normal EF (55 to 65%).  No ischemia or infarction.  LOW RISK.   REPLACEMENT TOTAL KNEE     RIGHT/LEFT HEART CATH AND CORONARY ANGIOGRAPHY N/A 03/09/2022   Procedure: RIGHT/LEFT HEART CATH AND CORONARY ANGIOGRAPHY;  Surgeon: Leonie Man, MD;  Location: [facility];  Service: Cardiovascular;  Laterality: N/A;   TONSILLECTOMY     TOTAL HIP ARTHROPLASTY Right 08/27/2013   Procedure: RIGHT TOTAL HIP ARTHROPLASTY ANTERIOR APPROACH;  Surgeon: Mauri Pole, MD;  Location: [facility];  Service: Orthopedics;  Laterality: Right;   TRANSTHORACIC ECHOCARDIOGRAM  12/2017   a) 3/'19: Mild LVH.  Nl EF 55-60%.  GR 1 DD.  No RWMA.  Mild AS (MG 21 mm); Normal LV size, fxn w/o WMA.  Mod Conc VH.  EF 55-60%.  GR 1 DD.  NlRV size & fxn. Mod AS (MG  21 mmHg); b)  12/2019: Mild AS - MG 21 mmHg; c) 07/2021: EF 60-65%. No RWMA. Mod AS (MG 35 mmHg, PG 37 mmHg)   TRANSTHORACIC ECHOCARDIOGRAM  02/02/2022   EF> 75%.  Moderate LVH.  GR 1 DD.  Normal RV and RVP.  Severe aortic valve calcification with SEVERE AS.  MG 44 mmHg up from 37 mmHg; AVA by VTI 1.05 cm.    Allergies  Allergies  Allergen Reactions   Codeine Other (See Comments)    "Walking the walls"   Morphine And Related Other (See Comments)    "Walking the walls"   Sulfa Antibiotics Rash    History of Present Illness    Raymond Cuevas has a PMH of severe aortic stenosis, HTN, HLD, obesity, COPD, osteoarthritis, and Alzheimer's dementia.  Cardiac catheterization 03/09/2022 showed severe aortic stenosis mildly elevated LVEDP.  He was noted to have minimal nonobstructive CAD.  Follow-up with structural heart team was recommended.  He was seen by Dr. Ellyn Hack on 02/28/2022 for review of his echocardiogram which showed severe aortic stenosis with a mean gradient of 44 mmHg.  He agreed to proceed with cardiac catheterization and TAVR evaluation.  He initially stated that he did not want to proceed with surgery however, after having conversation with family at home he decided to go ahead with the  cardiac cath cessation.  Post catheterization he was noted to be very agitated in the holding area following the procedure.  His family became worried about his mental status.  He was seen in follow-up by Dr. Ali Lowe on 06/03/2022.  He reported that he was feeling well.  The biggest concern was his mental status and forgetfulness.  He had been referred to neurology.  Neurology had started him on Namenda and Aricept for treatment of his dementia.  He presented with his daughter.  He denied chest pain and shortness of breath.  He was able to do most of his activities of daily living.  He was sedentary.  He did not like having therapists and nurses come to their house.  His daughter was frustrated with his noncompliance  with care team instructions.  Neurology felt that he was unsafe to drive.  He required repetition and redirection due to confusion during the exam.  He presents to the clinic today for follow-up evaluation and states he feels that he is doing fairly well.  He reports that he forgets to take his medication regularly.  He presents with a family friend who reports that his daughter had a stroke last year.  His neighbors and friends have been checking in on him.  He reports that he has been taking his aspirin regularly.  He is somewhat physically active walking up and down his driveway and around his house.  We reviewed the importance of follow-up with his PCP and pulmonology.  He would benefit from some daily assistance with his medications and supervision with his ADLs.  I will order a CBC today, BMP and plan follow-up for 6 months.  Today he denies chest pain, shortness of breath, lower extremity edema, fatigue, palpitations, melena, hematuria, hemoptysis, diaphoresis, weakness, presyncope, syncope, orthopnea, and PND.     Home Medications    Prior to Admission medications   Medication Sig Start Date End Date Taking? Authorizing Provider  albuterol (VENTOLIN HFA) 108 (90 Base) MCG/ACT inhaler INHALE 2 PUFFS BY MOUTH AS NEEDED 4 TIMES DAILY UNTIL WHEEZING EXTINGUISHED. RINSE AND SPIT AFTER USE. DEMONSTRATE PROPER TECHNIQUE INHALATI 08/18/21   Mannam, Praveen, MD  Ascorbic Acid (VITAMIN C) 1000 MG tablet Take 1,000 mg by mouth daily.    [provider]  aspirin EC 81 MG tablet Take 81 mg by mouth daily.    [provider]  Calcium Carb-Cholecalciferol 600-20 MG-MCG TABS Take 1 tablet by mouth daily.    [provider]  donepezil (ARICEPT) 10 MG tablet Take 1 tablet (10 mg total) by mouth at bedtime. Patient not taking: Reported on 06/03/2022 04/13/22   Alric Ran, MD  ipratropium-albuterol (DUONEB) 0.5-2.5 (3) MG/3ML SOLN Take 3 mLs by nebulization every 6 (six) hours as  needed. Patient not taking: Reported on 04/13/2022 04/05/22   Marshell Garfinkel, MD  levothyroxine (SYNTHROID) 25 MCG tablet Take 25 mcg by mouth daily before breakfast. 07/06/21   [provider]  losartan (COZAAR) 25 MG tablet Take 25 mg by mouth daily.    [provider]  magnesium hydroxide (MILK OF MAGNESIA) 400 MG/5ML suspension Take 30 mLs by mouth daily as needed for moderate constipation or heartburn.    [provider]  memantine (NAMENDA) 10 MG tablet Take 1 tablet (10 mg total) by mouth 2 (two) times daily. Patient not taking: Reported on 06/03/2022 04/13/22 05/08/23  Alric Ran, MD  omeprazole (PRILOSEC OTC) 20 MG tablet Take 20 mg by mouth daily.    [provider]  rosuvastatin (CRESTOR) 10 MG tablet Take 10 mg by mouth daily.    [provider]  vitamin B-12 (CYANOCOBALAMIN) 1000 MCG tablet Take 1,000 mcg by mouth daily.    [provider]    Family History    Family History  Problem Relation Age of Onset   Sudden death Sister 74   He indicated that his mother is deceased. He indicated that his father is deceased. He indicated that the status of his sister is unknown. He indicated that his brother is deceased. He indicated that his maternal grandmother is deceased. He indicated that his maternal grandfather is deceased. He indicated that his paternal grandmother is deceased. He indicated that his paternal grandfather is deceased.  Social History    Social History   Socioeconomic History   Marital status: Widowed    Spouse name: Not on file   Number of children: Not on file   Years of education: Not on file   Highest education level: Not on file  Occupational History   Not on file  Tobacco Use   Smoking status: Never   Smokeless tobacco: Never  Vaping Use   Vaping Use: Never used  Substance and Sexual Activity   Alcohol use: Yes    Alcohol/week: 1.0 standard drink of alcohol    Types: 1 Cans of beer per week     Comment: rarely   Drug use: No   Sexual activity: Never    Birth control/protection: None  Other Topics Concern   Not on file  Social History Narrative   Morning coffee (when up)   Social Determinants of Health   Financial Resource Strain: Not on file  Food Insecurity: Not on file  Transportation Needs: Not on file  Physical Activity: Not on file  Stress: Not on file  Social Connections: Not on file  Intimate Partner Violence: Not on file     Review of Systems    General:  No chills, fever, night sweats or weight changes.  Cardiovascular:  No chest pain, dyspnea on exertion, edema, orthopnea, palpitations, paroxysmal nocturnal dyspnea. Dermatological: No rash, lesions/masses Respiratory: No cough, dyspnea Urologic: No hematuria, dysuria Abdominal:   No nausea, vomiting, diarrhea, bright red blood per rectum, melena, or hematemesis Neurologic:  No visual changes, wkns, changes in mental status. All other systems reviewed and are otherwise negative except as noted above.  Physical Exam    VS:  BP 122/64 (BP Location: Left Arm, Patient Position: Sitting, Cuff Size: Normal)   Pulse 83   Ht 6' 2"$  (1.88 m)   Wt 247 lb (112 kg)   BMI 31.71 kg/m  , BMI Body mass index is 31.71 kg/m. GEN: Well nourished, well developed, in no acute distress. HEENT: normal. Neck: Supple, no JVD, carotid bruits, or masses. Cardiac: RRR, no murmurs, rubs, or gallops. No clubbing, cyanosis, edema.  Radials/DP/PT 2+ and equal bilaterally.  Respiratory:  Respirations regular and unlabored, clear to auscultation bilaterally. GI: Soft, nontender, nondistended, BS + x 4. MS: no deformity or atrophy. Skin: warm and dry, no rash. Neuro:  Strength and sensation are intact. Psych: Normal affect.  Accessory Clinical Findings    Recent Labs: 03/04/2022: BUN 11; Creatinine, Ser 1.21; Platelets 402 03/09/2022: Hemoglobin 13.3; Potassium 3.9; Sodium 142   Recent Lipid Panel No results found for: "CHOL",  "TRIG", "HDL", "CHOLHDL", "VLDL", "LDLCALC", "LDLDIRECT"       ECG personally reviewed by me today-normal sinus rhythm no ectopy 83 bpm- No acute changes  Echocardiogram  02/02/2022  IMPRESSIONS     1. Left ventricular ejection fraction, by estimation, is >75%. The left  ventricle has hyperdynamic function. The left ventricle has no regional  wall motion abnormalities. There is moderate left ventricular hypertrophy.  Left ventricular diastolic  parameters are consistent with Grade I diastolic dysfunction (impaired  relaxation).   2. Right ventricular systolic function is normal. The right ventricular  size is normal. There is normal pulmonary artery systolic pressure. The  estimated right ventricular systolic pressure is XX123456 mmHg.   3. The mitral valve is grossly normal. Trivial mitral valve  regurgitation.   4. The aortic valve is tricuspid. There is severe calcifcation of the  aortic valve. There is moderate thickening of the aortic valve. Aortic  valve regurgitation is mild. Severe aortic valve stenosis. Aortic  regurgitation PHT measures 507 msec. Aortic  valve area, by VTI measures 1.05 cm. Aortic valve mean gradient measures  44.0 mmHg. Aortic valve Vmax measures 4.31 m/s. DI is 0.30.   5. The inferior vena cava is normal in size with greater than 50%  respiratory variability, suggesting right atrial pressure of 3 mmHg.   6. Cannot exclude a small PFO.   Comparison(s): Changes from prior study are noted. 07/26/2021: LVEF  60-65%, moderate AS with mean gradient 35 mmHg.   FINDINGS   Left Ventricle: Left ventricular ejection fraction, by estimation, is  >75%. The left ventricle has hyperdynamic function. The left ventricle has  no regional wall motion abnormalities. The left ventricular internal  cavity size was normal in size. There  is moderate left ventricular hypertrophy. Left ventricular diastolic  parameters are consistent with Grade I diastolic dysfunction  (impaired  relaxation). Indeterminate filling pressures.   Right Ventricle: The right ventricular size is normal. No increase in  right ventricular wall thickness. Right ventricular systolic function is  normal. There is normal pulmonary artery systolic pressure. The tricuspid  regurgitant velocity is 2.46 m/s, and   with an assumed right atrial pressure of 3 mmHg, the estimated right  ventricular systolic pressure is [id number] mmHg.   Left Atrium: Left atrial size was normal in size.   Right Atrium: Right atrial size was normal in size.   Pericardium: There is no evidence of pericardial effusion.   Mitral Valve: The mitral valve is grossly normal. Trivial mitral valve  regurgitation.   Tricuspid Valve: The tricuspid valve is grossly normal. Tricuspid valve  regurgitation is trivial.   Aortic Valve: The aortic valve is tricuspid. There is severe calcifcation  of the aortic valve. There is moderate thickening of the aortic valve.  Aortic valve regurgitation is mild. Aortic regurgitation PHT measures 507  msec. Severe aortic stenosis is  present. Aortic valve mean gradient measures 44.0 mmHg. Aortic valve peak  gradient measures 74.3 mmHg. Aortic valve area, by VTI measures 1.05 cm.   Pulmonic Valve: The pulmonic valve was grossly normal. Pulmonic valve  regurgitation is trivial.   Aorta: The aortic root and ascending aorta are structurally normal, with  no evidence of dilitation.   Venous: The inferior vena cava is normal in size with greater than 50%  respiratory variability, suggesting right atrial pressure of 3 mmHg.   IAS/Shunts: The interatrial septum is aneurysmal. Cannot exclude a small  PFO.   Cardiac catheterization 03/09/2022    Angiographically minimal CAD   There is SEVERE AORTIC VALVE STENOSIS.   LV end diastolic pressure is mildly elevated.   Hemodynamic findings consistent with aortic valve stenosis.   POST-OPERATIVE DIAGNOSIS:  Severe Aortic Stenosis by  Echocardiogram Confirmed by Cath: P-P gradient 53.1 mmHg, mean 42.3 mmHg Normal Right Heart Cath numbers with PCWP and LVEDP of roughly 15 mmHg.  Mean PAP 13 mmHg Angiographically minimal CAD with a Right Dominant System       Plan: He will follow-up with Dr. Lenna Sciara on Friday, 03/12/2019 to discuss potential options for TAVR       Glenetta Hew, MD  Assessment & Plan   1.  Severe aortic stenosis-remains fairly sedentary.  Able to do activities of daily living.  Denies chest pain or shortness of breath.  Was seen and evaluated by structural heart team.  He is not a candidate for TAVR due to dementia. Maintain physical activity as tolerated Continue medical therapy  Essential hypertension-BP today 122/64 Heart healthy low-sodium diet Increase physical activity as tolerated Order BMP, CBC  Hyperlipidemia-LDL 31 on 09/04/20 Continue rosuvastatin, aspirin Heart healthy low-sodium diet-salty 6 given Increase physical activity as tolerated  Morbid obesity-weight today 247 lbs Heart healthy reduced calorie diet Increase physical activity as tolerated  Dementia-follows with neurology.  Neurology felt that it was unsafe for him to drive. Continue Aricept and Namenda  Medication management-has been having trouble remembering to take medication.  Will contact pharmacy to see if he may have his medications placed in peel packs.   Disposition: Follow-up with Dr. Ellyn Hack in 6 months.   Jossie Ng. Mckaylee Dimalanta NP-C     11/30/2022, 1:59 PM [place] [address] Office [phone] Fax [phone]    I spent 14 minutes examining this patient, reviewing medications, and using patient centered shared decision making involving her cardiac care.  Prior to her visit I spent greater than 20 minutes reviewing her past medical history,  medications, and prior cardiac tests.

## 2022-11-30 ENCOUNTER — Encounter: Payer: Self-pay | Admitting: General Practice

## 2022-11-30 ENCOUNTER — Ambulatory Visit: Payer: Medicare Other | Attending: General Practice | Admitting: General Practice

## 2022-11-30 VITALS — BP 122/64 | HR 83 | Ht 74.0 in | Wt 247.0 lb

## 2022-11-30 DIAGNOSIS — E785 Hyperlipidemia, unspecified: Secondary | ICD-10-CM | POA: Diagnosis not present

## 2022-11-30 DIAGNOSIS — I350 Nonrheumatic aortic (valve) stenosis: Secondary | ICD-10-CM

## 2022-11-30 DIAGNOSIS — I10 Essential (primary) hypertension: Secondary | ICD-10-CM | POA: Diagnosis not present

## 2022-11-30 DIAGNOSIS — F0390 Unspecified dementia without behavioral disturbance: Secondary | ICD-10-CM | POA: Diagnosis not present

## 2022-11-30 DIAGNOSIS — Z79899 Other long term (current) drug therapy: Secondary | ICD-10-CM | POA: Diagnosis not present

## 2022-11-30 NOTE — Patient Instructions (Signed)
Medication Instructions:  The current medical regimen is effective;  continue present plan and medications as directed. Please refer to the Current Medication list given to you today.  *If you need a refill on your cardiac medications before your next appointment, please call your pharmacy*   Lab Work: [place] If you have labs (blood work) drawn today and your tests are completely normal, you will receive your results only by: [place] (if you have MyChart) OR  A paper copy in the mail If you have any lab test that is abnormal or we need to change your treatment, we will call you to review the results.  Testing/Procedures: NONE  Other Instructions MAINTAIN PHYSICAL ACTIVITY AS TOLERATED  FOLLOW UP WITH PCP ABOUT YOUR MEMORY, THYROID AND DISCUSS ASSISTED LIVING FOR YOU  FOLLOW UP WITH PULMONARY FOR YOUR COPD  PHARMACY FOR YOUR MEDICATION TO BE  BLISTER PACKED  PLEASE READ AND FOLLOW ATTACHED  SAL[facility] 6  Follow-Up: At [facility], you and your health needs are our priority.  As part of our continuing mission to provide you with exceptional heart care, we have created designated Provider Care Teams.  These Care Teams include your primary Cardiologist (physician) and Advanced Practice Providers (APPs -  Physician Assistants and Nurse Practitioners) who all work together to provide you with the care you need, when you need it.  Your next appointment:   6 month(s)  Provider:   Glenetta Hew, MD

## 2022-12-01 LAB — CBC
Hematocrit: 46.2 % (ref 37.5–51.0)
Hemoglobin: 15.5 g/dL (ref 13.0–17.7)
MCH: 31.8 pg (ref 26.6–33.0)
MCHC: 33.5 g/dL (ref 31.5–35.7)
MCV: 95 fL (ref 79–97)
Platelets: 375 10*3/uL (ref 150–450)
RBC: 4.87 x10E6/uL (ref 4.14–5.80)
RDW: 12.4 % (ref 11.6–15.4)
WBC: 6.5 10*3/uL (ref 3.4–10.8)

## 2022-12-01 LAB — BASIC METABOLIC PANEL
BUN/Creatinine Ratio: 10 (ref 10–24)
BUN: 11 mg/dL (ref 8–27)
CO2: 22 mmol/L (ref 20–29)
Calcium: 9.8 mg/dL (ref 8.6–10.2)
Chloride: 106 mmol/L (ref 96–106)
Creatinine, Ser: 1.15 mg/dL (ref 0.76–1.27)
Glucose: 95 mg/dL (ref 70–99)
Potassium: 4.6 mmol/L (ref 3.5–5.2)
Sodium: 142 mmol/L (ref 134–144)
eGFR: 62 mL/min/{1.73_m2} (ref >59)

## 2022-12-07 ENCOUNTER — Ambulatory Visit: Payer: Medicare Other | Admitting: Pulmonary Disease

## 2022-12-21 ENCOUNTER — Ambulatory Visit: Payer: Medicare Other | Admitting: Pulmonary Disease

## 2022-12-27 ENCOUNTER — Encounter (HOSPITAL_COMMUNITY): Payer: Self-pay | Admitting: Emergency Medicine

## 2022-12-27 ENCOUNTER — Emergency Department (HOSPITAL_COMMUNITY): Payer: Medicare Other

## 2022-12-27 ENCOUNTER — Other Ambulatory Visit: Payer: Self-pay

## 2022-12-27 ENCOUNTER — Emergency Department (HOSPITAL_COMMUNITY)
Admission: EM | Admit: 2022-12-27 | Discharge: 2022-12-27 | Disposition: A | Discharge status: Home / Self Care | Payer: Medicare Other | Attending: Emergency Medicine | Admitting: Emergency Medicine

## 2022-12-27 DIAGNOSIS — Z1152 Encounter for screening for COVID-19: Secondary | ICD-10-CM | POA: Diagnosis not present

## 2022-12-27 DIAGNOSIS — I10 Essential (primary) hypertension: Secondary | ICD-10-CM | POA: Insufficient documentation

## 2022-12-27 DIAGNOSIS — Z8673 Personal history of transient ischemic attack (TIA), and cerebral infarction without residual deficits: Secondary | ICD-10-CM | POA: Insufficient documentation

## 2022-12-27 DIAGNOSIS — Z7982 Long term (current) use of aspirin: Secondary | ICD-10-CM | POA: Insufficient documentation

## 2022-12-27 DIAGNOSIS — F03A0 Unspecified dementia, mild, without behavioral disturbance, psychotic disturbance, mood disturbance, and anxiety: Secondary | ICD-10-CM | POA: Diagnosis not present

## 2022-12-27 DIAGNOSIS — R0789 Other chest pain: Secondary | ICD-10-CM | POA: Diagnosis not present

## 2022-12-27 DIAGNOSIS — Z79899 Other long term (current) drug therapy: Secondary | ICD-10-CM | POA: Insufficient documentation

## 2022-12-27 DIAGNOSIS — J441 Chronic obstructive pulmonary disease with (acute) exacerbation: Secondary | ICD-10-CM | POA: Insufficient documentation

## 2022-12-27 DIAGNOSIS — R0602 Shortness of breath: Secondary | ICD-10-CM | POA: Diagnosis not present

## 2022-12-27 DIAGNOSIS — R079 Chest pain, unspecified: Secondary | ICD-10-CM | POA: Diagnosis not present

## 2022-12-27 LAB — TROPONIN I (HIGH SENSITIVITY): Troponin I (High Sensitivity): 11 ng/L (ref <18)

## 2022-12-27 LAB — CBC WITH DIFFERENTIAL/PLATELET
Abs Immature Granulocytes: 0.04 10*3/uL (ref 0.00–0.07)
Basophils Absolute: 0 10*3/uL (ref 0.0–0.1)
Basophils Relative: 0 %
Eosinophils Absolute: 0.2 10*3/uL (ref 0.0–0.5)
Eosinophils Relative: 2 %
HCT: 38.4 % — ABNORMAL LOW (ref 39.0–52.0)
Hemoglobin: 13.2 g/dL (ref 13.0–17.0)
Immature Granulocytes: 0 %
Lymphocytes Relative: 11 %
Lymphs Abs: 1 10*3/uL (ref 0.7–4.0)
MCH: 32.5 pg (ref 26.0–34.0)
MCHC: 34.4 g/dL (ref 30.0–36.0)
MCV: 94.6 fL (ref 80.0–100.0)
Monocytes Absolute: 0.8 10*3/uL (ref 0.1–1.0)
Monocytes Relative: 8 %
Neutro Abs: 7.1 10*3/uL (ref 1.7–7.7)
Neutrophils Relative %: 79 %
Platelets: 440 10*3/uL — ABNORMAL HIGH (ref 150–400)
RBC: 4.06 MIL/uL — ABNORMAL LOW (ref 4.22–5.81)
RDW: 12.7 % (ref 11.5–15.5)
WBC: 9 10*3/uL (ref 4.0–10.5)
nRBC: 0 % (ref 0.0–0.2)

## 2022-12-27 LAB — BASIC METABOLIC PANEL
Anion gap: 11 (ref 5–15)
BUN: 11 mg/dL (ref 8–23)
CO2: 23 mmol/L (ref 22–32)
Calcium: 8.4 mg/dL — ABNORMAL LOW (ref 8.9–10.3)
Chloride: 105 mmol/L (ref 98–111)
Creatinine, Ser: 1.03 mg/dL (ref 0.61–1.24)
GFR, Estimated: >60 mL/min (ref >60)
Glucose, Bld: 110 mg/dL — ABNORMAL HIGH (ref 70–99)
Potassium: 3.1 mmol/L — ABNORMAL LOW (ref 3.5–5.1)
Sodium: 139 mmol/L (ref 135–145)

## 2022-12-27 LAB — RESP PANEL BY RT-PCR (RSV, FLU A&B, COVID)  RVPGX2
Influenza A by PCR: NEGATIVE
Influenza B by PCR: NEGATIVE
Resp Syncytial Virus by PCR: NEGATIVE
SARS Coronavirus 2 by RT PCR: NEGATIVE

## 2022-12-27 LAB — BRAIN NATRIURETIC PEPTIDE: B Natriuretic Peptide: 136.3 pg/mL — ABNORMAL HIGH (ref 0.0–100.0)

## 2022-12-27 LAB — MAGNESIUM: Magnesium: 2.3 mg/dL (ref 1.7–2.4)

## 2022-12-27 MED ORDER — ALBUTEROL SULFATE HFA 108 (90 BASE) MCG/ACT IN AERS
1.0000 | INHALATION_SPRAY | RESPIRATORY_TRACT | Status: DC | PRN
  Administered 2022-12-27: 1 via RESPIRATORY_TRACT
  Filled 2022-12-27: qty 6.7

## 2022-12-27 MED ORDER — IPRATROPIUM-ALBUTEROL 0.5-2.5 (3) MG/3ML IN SOLN
3.0000 mL | Freq: Once | RESPIRATORY_TRACT | Status: AC
  Administered 2022-12-27: 3 mL via RESPIRATORY_TRACT
  Filled 2022-12-27: qty 3

## 2022-12-27 MED ORDER — POTASSIUM CHLORIDE CRYS ER 20 MEQ PO TBCR
40.0000 meq | EXTENDED_RELEASE_TABLET | Freq: Once | ORAL | Status: AC
  Administered 2022-12-27: 40 meq via ORAL
  Filled 2022-12-27: qty 2

## 2022-12-27 MED ORDER — AEROCHAMBER Z-STAT PLUS/MEDIUM MISC
1.0000 | Freq: Once | Status: AC
  Administered 2022-12-27: 1
  Filled 2022-12-27: qty 1

## 2022-12-27 NOTE — ED Notes (Signed)
Ambulated patient down the hallway. Patient O2 sats stayed between 97-99% while ambulating. Patient has no difficulty. MD notified.

## 2022-12-27 NOTE — ED Triage Notes (Signed)
Per [facility] pt coming from home where he lives alone. C/o shortness of breath when waking this morning. EMS reports patient walking to truck and in NAD.

## 2022-12-27 NOTE — ED Provider Notes (Signed)
[place] [facility] Provider Note   CSN: [id number] Arrival date & time: 12/27/22  1007     History  Chief Complaint  Patient presents with   Shortness of Breath    Raymond Cuevas is a 89 y.o. male.  Pt is a 89 yo male with pmhx significant for gerd, copd, htn, hld, oa, aortic stenosis, and mild dementia.  Pt called EMS today b/c he was sob.  Pt lives alone.  His daughter was checking on him, but she had a stroke and is paralyzed on one side and no longer drives.  His sister (mid-70s) now checks on him.  Sister was supposed to bring him his meds (he ran out yesterday), but she was busy with her daughter and could not bring them.  So, he did not have any of his meds today.  Pt denies f/c.  He feels like he can't get a deep breath.       Home Medications Prior to Admission medications   Medication Sig Start Date End Date Taking? Authorizing Provider  albuterol (VENTOLIN HFA) 108 (90 Base) MCG/ACT inhaler INHALE 2 PUFFS BY MOUTH AS NEEDED 4 TIMES DAILY UNTIL WHEEZING EXTINGUISHED. RINSE AND SPIT AFTER USE. DEMONSTRATE PROPER TECHNIQUE INHALATI 08/18/21   Mannam, Praveen, MD  Ascorbic Acid (VITAMIN C) 1000 MG tablet Take 1,000 mg by mouth daily.    [provider]  aspirin EC 81 MG tablet Take 81 mg by mouth daily.    [provider]  Calcium Carb-Cholecalciferol 600-20 MG-MCG TABS Take 1 tablet by mouth daily.    [provider]  ipratropium-albuterol (DUONEB) 0.5-2.5 (3) MG/3ML SOLN Take 3 mLs by nebulization every 6 (six) hours as needed. 04/05/22   Marshell Garfinkel, MD  levothyroxine (SYNTHROID) 25 MCG tablet Take 25 mcg by mouth daily before breakfast. 07/06/21   [provider]  losartan (COZAAR) 25 MG tablet Take 25 mg by mouth daily.    [provider]  magnesium hydroxide (MILK OF MAGNESIA) 400 MG/5ML suspension Take 30 mLs by mouth daily as needed for moderate constipation or heartburn.     [provider]  memantine (NAMENDA) 10 MG tablet Take 1 tablet (10 mg total) by mouth 2 (two) times daily. 04/13/22 05/08/23  Alric Ran, MD  omeprazole (PRILOSEC OTC) 20 MG tablet Take 20 mg by mouth daily.    [provider]  rosuvastatin (CRESTOR) 10 MG tablet Take 10 mg by mouth daily.    [provider]  vitamin B-12 (CYANOCOBALAMIN) 1000 MCG tablet Take 1,000 mcg by mouth daily.    [provider]      Allergies    Codeine, Morphine and related, and Sulfa antibiotics    Review of Systems   Review of Systems  Respiratory:  Positive for shortness of breath.   All other systems reviewed and are negative.   Physical Exam Updated Vital Signs BP (!) 131/59 (BP Location: Left Arm)   Pulse 79   Temp 98.5 F (36.9 C) (Oral)   Resp 15   Ht '6\' 2"'$  (1.88 m)   Wt 111.1 kg   SpO2 98%   BMI 31.46 kg/m  Physical Exam Vitals and nursing note reviewed.  Constitutional:      Appearance: He is well-developed.  HENT:     Head: Normocephalic and atraumatic.     Mouth/Throat:     Mouth: Mucous membranes are moist.     Pharynx: Oropharynx is clear.  Eyes:  Extraocular Movements: Extraocular movements intact.     Pupils: Pupils are equal, round, and reactive to light.  Cardiovascular:     Rate and Rhythm: Normal rate and regular rhythm.  Pulmonary:     Effort: Pulmonary effort is normal.     Breath sounds: Normal breath sounds.  Abdominal:     General: Bowel sounds are normal.     Palpations: Abdomen is soft.  Musculoskeletal:        General: Normal range of motion.     Cervical back: Normal range of motion and neck supple.  Skin:    General: Skin is warm.     Capillary Refill: Capillary refill takes less than 2 seconds.  Neurological:     General: No focal deficit present.     Mental Status: He is alert and oriented to person, place, and time.  Psychiatric:        Mood and Affect: Mood normal.     ED Results / Procedures /  Treatments   Labs (all labs ordered are listed, but only abnormal results are displayed) Labs Reviewed  BASIC METABOLIC PANEL - Abnormal; Notable for the following components:      Result Value   Potassium 3.1 (*)    Glucose, Bld 110 (*)    Calcium 8.4 (*)    All other components within normal limits  BRAIN NATRIURETIC PEPTIDE - Abnormal; Notable for the following components:   B Natriuretic Peptide 136.3 (*)    All other components within normal limits  CBC WITH DIFFERENTIAL/PLATELET - Abnormal; Notable for the following components:   RBC 4.06 (*)    HCT 38.4 (*)    Platelets 440 (*)    All other components within normal limits  RESP PANEL BY RT-PCR (RSV, FLU A&B, COVID)  RVPGX2  MAGNESIUM  TROPONIN I (HIGH SENSITIVITY)  TROPONIN I (HIGH SENSITIVITY)    EKG EKG Interpretation  Date/Time:  Tuesday December 27 2022 10:15:08 EDT Ventricular Rate:  78 PR Interval:  240 QRS Duration: 92 QT Interval:  392 QTC Calculation: 447 R Axis:   42 Text Interpretation: Sinus rhythm Prolonged PR interval No old tracing to compare Confirmed by Isla Pence [phone]) on 12/27/2022 10:34:15 AM  Radiology DG Chest Port 1 View  Result Date: 12/27/2022 CLINICAL DATA:  Per chart - Per [facility] pt coming from home where he lives alone. C/o shortness of breath when waking this morning. EMS reports patient walking to truck and in NAD EXAM: PORTABLE CHEST - 1 VIEW COMPARISON:  05/06/2020 FINDINGS: Lungs are clear. Heart size and mediastinal contours are within normal limits. No effusion. Vertebral endplate spurring at multiple levels in the mid thoracic spine. IMPRESSION: No acute cardiopulmonary disease. Electronically Signed   By: Lucrezia Europe M.D.   On: 12/27/2022 10:34    Procedures Procedures    Medications Ordered in ED Medications  albuterol (VENTOLIN HFA) 108 (90 Base) MCG/ACT inhaler 1-2 puff (has no administration in time range)  aerochamber Z-Stat Plus/medium 1 each (has no administration in  time range)  ipratropium-albuterol (DUONEB) 0.5-2.5 (3) MG/3ML nebulizer solution 3 mL (3 mLs Nebulization Given 12/27/22 1035)  potassium chloride SA (KLOR-CON M) CR tablet 40 mEq (40 mEq Oral Given 12/27/22 1141)    ED Course/ Medical Decision Making/ A&P                             Medical Decision Making Amount and/or Complexity of Data Reviewed Labs: ordered.  Radiology: ordered.  Risk Prescription drug management.   This patient presents to the ED for concern of sob, this involves an extensive number of treatment options, and is a complaint that carries with it a high risk of complications and morbidity.  The differential diagnosis includes pna, copd, covid/flu/rsv, anemia   Co morbidities that complicate the patient evaluation  gerd, copd, htn, hld, oa, aortic stenosis, and mild dementia   Additional history obtained:  Additional history obtained from epic chart review External records from outside source obtained and reviewed including EMS report   Lab Tests:  I Ordered, and personally interpreted labs.  The pertinent results include:  cbc nl, bmp nl other than k low at 3.1, trop nl   Imaging Studies ordered:  I ordered imaging studies including cxr  I independently visualized and interpreted imaging which showed No acute cardiopulmonary disease.  I agree with the radiologist interpretation   Cardiac Monitoring:  The patient was maintained on a cardiac monitor.  I personally viewed and interpreted the cardiac monitored which showed an underlying rhythm of: nsr   Medicines ordered and prescription drug management:  I ordered medication including duoneb  for sob  Reevaluation of the patient after these medicines showed that the patient improved I have reviewed the patients home medicines and have made adjustments as needed   Test Considered:  cxr   Problem List / ED Course:  SOB:  likely from COPD.  He is out of his inhaler and is feeling better after  neb.  He is given an inhaler and spacer.  He is able to ambulate without sob and O2 sats stay excellent.  Pt is stable for d/c.  Return if worse.  F/u with pcp.   Reevaluation:  After the interventions noted above, I reevaluated the patient and found that they have :improved   Social Determinants of Health:  Lives at home   Dispostion:  After consideration of the diagnostic results and the patients response to treatment, I feel that the patent would benefit from discharge with outpatient f/u.          Final Clinical Impression(s) / ED Diagnoses Final diagnoses:  COPD exacerbation [facility])    Rx / DC Orders ED Discharge Orders     None         Isla Pence, MD 12/27/22 1222

## 2022-12-30 ENCOUNTER — Ambulatory Visit: Payer: Medicare Other | Admitting: Nurse Practitioner

## 2023-01-02 ENCOUNTER — Ambulatory Visit: Payer: Medicare Other | Admitting: Nurse Practitioner

## 2023-01-18 ENCOUNTER — Telehealth (HOSPITAL_COMMUNITY): Payer: Self-pay | Admitting: Licensed Clinical Social Worker

## 2023-01-18 ENCOUNTER — Telehealth: Payer: Self-pay | Admitting: Cardiology

## 2023-01-18 ENCOUNTER — Telehealth: Payer: Self-pay | Admitting: *Deleted

## 2023-01-18 DIAGNOSIS — I10 Essential (primary) hypertension: Secondary | ICD-10-CM

## 2023-01-18 NOTE — Progress Notes (Signed)
  Care Coordination  Outreach Note  01/18/2023 Name: Raymond Cuevas MRN: [id number] DOB: 09/28/1934   Care Coordination Outreach Attempts: An unsuccessful telephone outreach was attempted today to offer the patient information about available care coordination services as a benefit of their health plan.   Follow Up Plan:  Additional outreach attempts will be made to offer the patient care coordination information and services.   Encounter Outcome:  No Answer  [place]  Direct Dial: [phone]

## 2023-01-18 NOTE — Progress Notes (Signed)
H&V Care Navigation CSW Progress Note  Clinical Social Worker consulted to reach out to pt contact, [name], regarding current concerns with patient safety.  Friend called the office and stated they hadn't seen the patient in a week or been able to contact by phone.  Worried about recent weight loss, self admitted not to take medications, and eating poorly- wondering if he needs placement given diagnosed dementia.  CSW attempted to call pt but no answer and line when to error message.  CSW then called [name] to discuss further as she is listed on DPR.  [name] is actually the dtr of the patients long term girlfriend who passed away in recent years.  Pt and Susans mom were never married but were together for a long time.  Pt does have a biological dtr but she had a massive stroke last year and is in a SNF.  States that last time she saw the dtr she was struggling to speak still so unsure if she can provide additional information.  Pt lives alone and [name] helps to go to the grocery store and set up pill boxes but pt apparently doesn't take his medications and is aware he isn't taking it.  [name] unaware if this is on purpose or he forgets.  [name] went to the house to check on patient and found him safely at home.  CSW asked to speak with pt.  He admits to being forgetful with medications.  Reports he has memory concerns but does not feel unsafe living alone.  Utilizes the microwave to cook and completes ADLs independently.  Relies on his friend to drive him places.  Only eats 2x a day due to appetite- has access to sufficient food.  Pt not interested in discussing placement- likes his home and has no desire to leave.  Pt agreeable to CSW placing [facility] consults for further assistance with med management and further evaluation of home safety.  SDOH Screenings   Tobacco Use: Low Risk  (12/27/2022)    Raymond Ny, LCSW Clinical Social Worker [facility] Desk#: [phone] Cell#:  [phone]

## 2023-01-18 NOTE — Telephone Encounter (Signed)
[name] patient's friend called stating she has been trying to get in contact with this patient and hasn't been able to reach him since last week Wednesday.   She states he is not taking his medication, he wasn't taking them even when he came to his last office visit.  There was discussion that he might need to be in assisted living she said.  She said he has lost a lot of weight, he is not eating very well. She is concerned and doesn't now what to do. She said she was going to try and call social services.

## 2023-01-18 NOTE — Progress Notes (Signed)
  Care Coordination   Note   01/18/2023 Name: TEMILOLUWA SPURLOCK MRN: [id number] DOB: 1934/07/30  MILFRED ZEINER is a 89 y.o. year old male who sees Deland Pretty, MD for primary care. I reached out to [name] by phone today to offer care coordination services.  Mr. Bundick was given information about Care Coordination services today including:   The Care Coordination services include support from the care team which includes your Nurse Coordinator, Clinical Social Worker, or Pharmacist.  The Care Coordination team is here to help remove barriers to the health concerns and goals most important to you. Care Coordination services are voluntary, and the patient may decline or stop services at any time by request to their care team member.   Care Coordination Consent Status: Patient care giver Bozovich,Susan DPR on file agreed to services and verbal consent obtained.   Follow up plan:  Telephone appointment with care coordination team member scheduled for:  RNCM scheduled 01/19/23 and SW 01/20/23  Encounter Outcome:  Pt. Scheduled  [place]  Direct Dial: [phone]

## 2023-01-19 ENCOUNTER — Telehealth: Payer: Self-pay | Admitting: *Deleted

## 2023-01-19 ENCOUNTER — Ambulatory Visit: Payer: Self-pay

## 2023-01-19 DIAGNOSIS — I10 Essential (primary) hypertension: Secondary | ICD-10-CM

## 2023-01-19 NOTE — Patient Outreach (Addendum)
  Care Coordination   Initial Visit Note   01/19/2023 Name: Raymond Cuevas MRN: [id number] DOB: [birth year]  Raymond Cuevas is a 89 y.o. year old male who sees Deland Pretty, MD for primary care. I  spoke with Doree Fudge (DPR).  Patient has some dementia and is HOH.  Patient lives alone. [name] is his step daughter and goes by to check on him. Patient does not have a POA but [name] will discuss with SW on 01/20/23. Patient not compliant with medications despite pillbox being fixed by [name]. Patient does not want to go to ALF at this time.Susan's biggest concern is medications and safety of patient at home. Patient still able to bath and dress himself, microwave meals, drives some to close places,  [name] does not want to take his car away at this time.  She is able to get into home now has she had keys made to the home.Upstream referral made for medications and referral for meals due to recent weight loss.     What matters to the patients health and wellness today?  Medication adherence and safety    Goals Addressed             This Visit's Progress    Medication Adherence and help with meals       Patient Goals/Self Care Activities: -Patient/Caregiver will self-administer medications as prescribed as evidenced by self-report/primary caregiver report  -Patient/Caregiver will attend all scheduled provider appointments as evidenced by clinician review of documented attendance to scheduled appointments and patient/caregiver report  Food insecurities Referral to care guide for meals on wheels.  Patient has dementia and unable to cook meals. Recent weight loss.  Patient also has issues with medication adherence. Upstream referral made for medication management. Discussed using an alarm for medications as a reminder. UPDATE: 2:33 pm Upstream appointment 02/08/23.   Interventions Today    Flowsheet Row Most Recent Value  Chronic Disease   Chronic disease during today's visit Hypertension (HTN)   General Interventions   General Interventions Discussed/Reviewed General Interventions Discussed, Doctor Visits, Communication with  Doctor Visits Discussed/Reviewed Doctor Visits Discussed  Communication with Pharmacists  [Referral to Upstream  UPDATE: 2:33 pm  Patient asssigned to upstream with appointment on 02-08-23]  Education Interventions   Education Provided Provided Education  Provided Verbal Education On Medication, Nutrition  Nutrition Interventions   Nutrition Discussed/Reviewed Nutrition Discussed, Decreasing salt  Pharmacy Interventions   Pharmacy Dicussed/Reviewed Pharmacy Topics Discussed, Medications and their functions              SDOH assessments and interventions completed:  Yes  SDOH Interventions Today    Flowsheet Row Most Recent Value  SDOH Interventions   Food Insecurity Interventions Intervention Not Indicated  Transportation Interventions Intervention Not Indicated        Care Coordination Interventions:  Yes, provided   Follow up plan: Follow up call scheduled for 01/26/23    Encounter Outcome:  Pt. Visit Completed   Jone Baseman, RN, MSN [facility] Care Management Coordinator Direct Line [phone]

## 2023-01-19 NOTE — Patient Instructions (Signed)
Visit Information  Thank you for taking time to visit with me today. Please don't hesitate to contact me if I can be of assistance to you.   Following are the goals we discussed today:   Goals Addressed             This Visit's Progress    Medication Adherence and help with meals       Patient Goals/Self Care Activities: -Patient/Caregiver will self-administer medications as prescribed as evidenced by self-report/primary caregiver report  -Patient/Caregiver will attend all scheduled provider appointments as evidenced by clinician review of documented attendance to scheduled appointments and patient/caregiver report  Food insecurities Referral to care guide for meals on wheels.  Patient has dementia and unable to cook meals. Recent weight loss.  Patient also has issues with medication adherence. Upstream referral made for medication management. Discussed using an alarm for medications as a reminder.   Interventions Today    Flowsheet Row Most Recent Value  Chronic Disease   Chronic disease during today's visit Hypertension (HTN)  General Interventions   General Interventions Discussed/Reviewed General Interventions Discussed, Doctor Visits, Communication with  Doctor Visits Discussed/Reviewed Doctor Visits Discussed  Communication with Pharmacists  [Referral to Upstream]  Education Interventions   Education Provided Provided Education  Provided Verbal Education On Medication, Nutrition  Nutrition Interventions   Nutrition Discussed/Reviewed Nutrition Discussed, Decreasing salt  Pharmacy Interventions   Pharmacy Dicussed/Reviewed Pharmacy Topics Discussed, Medications and their functions              Our next appointment is by telephone on 01/26/23 at 1030  Please call the care guide team at [phone] if you need to cancel or reschedule your appointment.   If you are experiencing a Mental Health or [place] or need someone to talk to, please call the  Suicide and Crisis Lifeline: 988   Patient verbalizes understanding of instructions and care plan provided today and agrees to view in [place]. Active MyChart status and patient understanding of how to access instructions and care plan via MyChart confirmed with patient.     The patient has been provided with contact information for the care management team and has been advised to call with any health related questions or concerns.   Jone Baseman, RN, MSN [place] [facility] Care Management Coordinator Direct Line [phone]

## 2023-01-20 ENCOUNTER — Ambulatory Visit: Payer: Self-pay | Admitting: *Deleted

## 2023-01-23 NOTE — Patient Outreach (Signed)
  Care Coordination   Initial Visit Note  Late Entry 01/23/2023 Name: JAYLEN ORNE MRN: [id number] DOB: [birth year]  EDILSON GAN is a 89 y.o. year old male who sees Merri Brunette, MD for primary care. I  spoke with pt's DRP/"step-daughter" on 01/20/23  What matters to the patients health and wellness today?  Concerns related to pt living alone and with STM decline.    Goals Addressed             This Visit's Progress    Provide resources,support and guidance for caregiving/needs        Activities and task to complete in order to accomplish goals.   Call your insurance provider for more information about your Enhanced Benefits   Review private pay home care options provided and discussed Review insurance policy to see if you have Long-term care insurance or a custodial care benefit Find out more about the Aid and Attendance program to see if you qualify [phone] Complete Advance Directive packet being mailed to you  Have advance directive notarized and provide a copy to provider office   Consider home devices (life alert, alexa, cameras,etc) Pharmacy team to discuss medication support to ensure compliance and accuracy          SDOH assessments and interventions completed:  Yes  SDOH Interventions Today    Flowsheet Row Most Recent Value  SDOH Interventions   Food Insecurity Interventions Intervention Not Indicated  Housing Interventions Intervention Not Indicated  [Pt has "lifetime right"]  Transportation Interventions Intervention Not Indicated  Utilities Interventions Intervention Not Indicated        Care Coordination Interventions:  Yes, provided  Interventions Today    Flowsheet Row Most Recent Value  Chronic Disease   Chronic disease during today's visit Hypertension (HTN), Other  [STM loss]  General Interventions   General Interventions Discussed/Reviewed General Interventions Discussed, General Interventions Reviewed, [employer], Level  of Care  Level of Care Applications  [discussed private pay care support options and [place]  Aide and Attendance programs]  Education Interventions   Provided Verbal Education On [employer]       Follow up plan: Follow up call scheduled for 01/24/23    Encounter Outcome:  Pt. Visit Completed

## 2023-01-23 NOTE — Patient Instructions (Signed)
Visit Information  Thank you for taking time to visit with me today. Please don't hesitate to contact me if I can be of assistance to you.   Following are the goals we discussed today:   Goals Addressed             This Visit's Progress    Provide resources,support and guidance for caregiving/needs        Activities and task to complete in order to accomplish goals.   Call your insurance provider for more information about your Enhanced Benefits   Review private pay home care options provided and discussed Review insurance policy to see if you have Long-term care insurance or a custodial care benefit Find out more about the Aid and Attendance program to see if you qualify [phone] Complete Advance Directive packet being mailed to you  Have advance directive notarized and provide a copy to provider office   Consider home devices (life alert, alexa, cameras,etc) Pharmacy team to discuss medication support to ensure compliance and accuracy          Our next appointment is by telephone on 01/24/23   Please call the care guide team at [phone] if you need to cancel or reschedule your appointment.   If you are experiencing a Mental Health or Behavioral Health Crisis or need someone to talk to, please call the Suicide and Crisis Lifeline: 988 call 911   The patient verbalized understanding of instructions, educational materials, and care plan provided today and DECLINED offer to receive copy of patient instructions, educational materials, and care plan.   Telephone follow up appointment with care management team member scheduled for:01/24/23  Reece Levy, MSW, LCSW Clinical Social Worker [employer] [phone]

## 2023-01-26 ENCOUNTER — Ambulatory Visit: Payer: Self-pay

## 2023-01-26 NOTE — Patient Instructions (Signed)
Visit Information  Thank you for taking time to visit with me today. Please don't hesitate to contact me if I can be of assistance to you.   Following are the goals we discussed today:   Goals Addressed             This Visit's Progress    Medication Adherence and help with meals       Patient Goals/Self Care Activities: -Patient/Caregiver will self-administer medications as prescribed as evidenced by self-report/primary caregiver report  -Patient/Caregiver will attend all scheduled provider appointments as evidenced by clinician review of documented attendance to scheduled appointments and patient/caregiver report  Food insecurities Referral to care guide for meals on wheels.  Patient has dementia and unable to cook meals. Recent weight loss.  Patient also has issues with medication adherence. Upstream referral made for medication management. Discussed using an alarm for medications as a reminder. UPDATE: 2:33 pm Upstream appointment 02/08/23.   Interventions Today    Flowsheet Row Most Recent Value  Chronic Disease   Chronic disease during today's visit Hypertension (HTN), Other  [Memory loss]  General Interventions   General Interventions Discussed/Reviewed General Interventions Reviewed  Doctor Visits Discussed/Reviewed Doctor Visits Discussed  Education Interventions   Education Provided Provided Education  Provided Verbal Education On Nutrition, Insurance Plans  Nutrition Interventions   Nutrition Discussed/Reviewed Nutrition Reviewed, Decreasing salt  Pharmacy Interventions   Pharmacy Dicussed/Reviewed Medication Adherence  Medication Adherence Not taking medication  Safety Interventions   Safety Discussed/Reviewed Safety Discussed  Advanced Directive Interventions   Advanced Directives Discussed/Reviewed Advanced Directives Discussed              Our next appointment is by telephone on 02/16/23 at 1000  Please call the care guide team at [phone] if you need  to cancel or reschedule your appointment.   If you are experiencing a Mental Health or Behavioral Health Crisis or need someone to talk to, please call the Suicide and Crisis Lifeline: 988   Patient verbalizes understanding of instructions and care plan provided today and agrees to view in MyChart. Active MyChart status and patient understanding of how to access instructions and care plan via MyChart confirmed with patient.     The patient has been provided with contact information for the care management team and has been advised to call with any health related questions or concerns.   Bary Leriche, RN, MSN [facility] Care Management Care Management Coordinator Direct Line [phone]

## 2023-01-26 NOTE — Patient Outreach (Addendum)
  Care Coordination   Follow Up Visit Note   01/26/2023 Name: Raymond Cuevas MRN: [id number] DOB: Jul 13, 1934  Raymond Cuevas is a 89 y.o. year old male who sees Merri Brunette, MD for primary care. I spoke with  Narda Rutherford by phone today.  What matters to the patients health and wellness today?  Maintain health    Goals Addressed             This Visit's Progress    Medication Adherence and help with meals       Patient Goals/Self Care Activities: -Patient/Caregiver will self-administer medications as prescribed as evidenced by self-report/primary caregiver report  -Patient/Caregiver will attend all scheduled provider appointments as evidenced by clinician review of documented attendance to scheduled appointments and patient/caregiver report  Food insecurities Referral to care guide for meals on wheels.  Patient has dementia and unable to cook meals. Recent weight loss.  Patient also has issues with medication adherence. Upstream referral made for medication management. Discussed using an alarm for medications as a reminder. UPDATE: 2:33 pm Upstream appointment 02/08/23.   Interventions Today    Flowsheet Row Most Recent Value  Chronic Disease   Chronic disease during today's visit Hypertension (HTN), Other  [Memory loss]  General Interventions   General Interventions Discussed/Reviewed General Interventions Reviewed  Doctor Visits Discussed/Reviewed Doctor Visits Discussed  Education Interventions   Education Provided Provided Education  Provided Verbal Education On Nutrition, Insurance Plans  Nutrition Interventions   Nutrition Discussed/Reviewed Nutrition Reviewed, Decreasing salt  Pharmacy Interventions   Pharmacy Dicussed/Reviewed Medication Adherence  Medication Adherence Not taking medication  Safety Interventions   Safety Discussed/Reviewed Safety Discussed  Advanced Directive Interventions   Advanced Directives Discussed/Reviewed Advanced Directives Discussed               SDOH assessments and interventions completed:  Yes     Care Coordination Interventions:  Yes, provided   Follow up plan: Follow up call scheduled for May    Encounter Outcome:  Pt. Visit Completed   Bary Leriche, RN, MSN [facility] Care Management Care Management Coordinator Direct Line [phone]

## 2023-02-01 DIAGNOSIS — E781 Pure hyperglyceridemia: Secondary | ICD-10-CM | POA: Diagnosis not present

## 2023-02-01 DIAGNOSIS — R739 Hyperglycemia, unspecified: Secondary | ICD-10-CM | POA: Diagnosis not present

## 2023-02-08 ENCOUNTER — Encounter: Payer: Self-pay | Admitting: *Deleted

## 2023-02-08 DIAGNOSIS — R0989 Other specified symptoms and signs involving the circulatory and respiratory systems: Secondary | ICD-10-CM | POA: Diagnosis not present

## 2023-02-08 DIAGNOSIS — I2510 Atherosclerotic heart disease of native coronary artery without angina pectoris: Secondary | ICD-10-CM | POA: Diagnosis not present

## 2023-02-08 DIAGNOSIS — J4521 Mild intermittent asthma with (acute) exacerbation: Secondary | ICD-10-CM | POA: Diagnosis not present

## 2023-02-08 DIAGNOSIS — D692 Other nonthrombocytopenic purpura: Secondary | ICD-10-CM | POA: Diagnosis not present

## 2023-02-08 DIAGNOSIS — E039 Hypothyroidism, unspecified: Secondary | ICD-10-CM | POA: Diagnosis not present

## 2023-02-08 DIAGNOSIS — Z8639 Personal history of other endocrine, nutritional and metabolic disease: Secondary | ICD-10-CM | POA: Diagnosis not present

## 2023-02-08 DIAGNOSIS — G309 Alzheimer's disease, unspecified: Secondary | ICD-10-CM | POA: Diagnosis not present

## 2023-02-08 DIAGNOSIS — I350 Nonrheumatic aortic (valve) stenosis: Secondary | ICD-10-CM | POA: Diagnosis not present

## 2023-02-08 DIAGNOSIS — B351 Tinea unguium: Secondary | ICD-10-CM | POA: Diagnosis not present

## 2023-02-08 DIAGNOSIS — I10 Essential (primary) hypertension: Secondary | ICD-10-CM | POA: Diagnosis not present

## 2023-02-08 DIAGNOSIS — K219 Gastro-esophageal reflux disease without esophagitis: Secondary | ICD-10-CM | POA: Diagnosis not present

## 2023-02-14 DIAGNOSIS — J4521 Mild intermittent asthma with (acute) exacerbation: Secondary | ICD-10-CM | POA: Diagnosis not present

## 2023-02-14 DIAGNOSIS — I10 Essential (primary) hypertension: Secondary | ICD-10-CM | POA: Diagnosis not present

## 2023-02-14 DIAGNOSIS — K219 Gastro-esophageal reflux disease without esophagitis: Secondary | ICD-10-CM | POA: Diagnosis not present

## 2023-02-14 DIAGNOSIS — E781 Pure hyperglyceridemia: Secondary | ICD-10-CM | POA: Diagnosis not present

## 2023-02-16 ENCOUNTER — Ambulatory Visit: Payer: Self-pay

## 2023-02-16 NOTE — Patient Outreach (Signed)
  Care Coordination   02/16/2023 Name: Raymond Cuevas MRN: [id number] DOB: 1933-11-27   Care Coordination Outreach Attempts:  An unsuccessful telephone outreach was attempted today to offer the patient information about available care coordination services.  Follow Up Plan:  Additional outreach attempts will be made to offer the patient care coordination information and services.   Encounter Outcome:  No Answer   Care Coordination Interventions:  No, not indicated    Bary Leriche, RN, MSN [facility] Care Management Care Management Coordinator Direct Line [phone]

## 2023-02-20 ENCOUNTER — Ambulatory Visit: Payer: Medicare Other | Admitting: Podiatry

## 2023-02-20 ENCOUNTER — Telehealth: Payer: Self-pay

## 2023-02-20 NOTE — Patient Outreach (Signed)
  Care Coordination   02/20/2023 Name: Raymond Cuevas MRN: [id number] DOB: 02/04/1934   Care Coordination Outreach Attempts:  A second unsuccessful outreach was attempted today to offer the patient with information about available care coordination services.  Follow Up Plan:  Additional outreach attempts will be made to offer the patient care coordination information and services.   Encounter Outcome:  No Answer   Care Coordination Interventions:  No, not indicated    Bary Leriche, RN, MSN [facility] Care Management Care Management Coordinator Direct Line [phone]

## 2023-02-21 ENCOUNTER — Telehealth: Payer: Self-pay | Admitting: *Deleted

## 2023-02-21 NOTE — Progress Notes (Signed)
  Care Coordination Note  02/21/2023 Name: Raymond Cuevas MRN: [id number] DOB: 1934/04/27  Raymond Cuevas is a 89 y.o. year old male who is a primary care patient of Merri Brunette, MD and is actively engaged with the care management team. I reached out to Flossie Buffy by phone today to assist with re-scheduling a follow up visit with the RN Case Manager and Licensed Clinical Social Worker  Follow up plan: Unsuccessful telephone outreach attempt made. A HIPAA compliant phone message was left for the patient providing contact information and requesting a return call.   [facility]  Care Coordination Care Guide  Direct Dial: [phone]

## 2023-02-24 NOTE — Progress Notes (Signed)
  Care Coordination Note  02/24/2023 Name: Raymond Cuevas MRN: [id number] DOB: 04-May-1934  [name] is a 89 y.o. year old male who is a primary care patient of Merri Brunette, MD and is actively engaged with the care management team. I reached out to Flossie Buffy by phone today to assist with re-scheduling a follow up visit with the RN Case Manager and Licensed Clinical Social Worker  Follow up plan: Telephone appointment with care management team member scheduled for:5/16 with SW and 5/28 with [facility]  [facility] Coordination Care Guide  Direct Dial: [phone]

## 2023-02-24 NOTE — Progress Notes (Signed)
  Care Coordination Note  02/24/2023 Name: Raymond Cuevas MRN: [id number] DOB: 06/20/1934  Raymond Cuevas is a 89 y.o. year old male who is a primary care patient of Merri Brunette, MD and is actively engaged with the care management team. I reached out to Flossie Buffy by phone today to assist with re-scheduling a follow up visit with the RN Case Manager and Licensed Clinical Social Worker  Follow up plan: Unsuccessful telephone outreach attempt made. A HIPAA compliant phone message was left for the patient providing contact information and requesting a return call.   SIGNATURE

## 2023-02-27 ENCOUNTER — Telehealth: Payer: Self-pay

## 2023-02-27 NOTE — Patient Outreach (Addendum)
  Care Coordination   02/27/2023 Name: Raymond Cuevas MRN: [id number] DOB: 15-Jan-1934   Care Coordination Outreach Attempts:  A third unsuccessful outreach was attempted today to offer the patient with information about available care coordination services.  Follow Up Plan:  Additional outreach attempts will be made to offer the patient care coordination information and services.   Encounter Outcome:  No Answer   Care Coordination Interventions:  No, not indicated    Bary Leriche, RN, MSN [facility] Care Management Care Management Coordinator Direct Line [phone]

## 2023-03-02 ENCOUNTER — Encounter: Payer: Self-pay | Admitting: *Deleted

## 2023-03-06 ENCOUNTER — Telehealth: Payer: Self-pay | Admitting: *Deleted

## 2023-03-06 NOTE — Patient Outreach (Signed)
  Care Coordination   03/06/2023 Late Entry for 03/02/23 Name: Raymond Cuevas MRN: [id number] DOB: 08-03-1934   Care Coordination Outreach Attempts:  An unsuccessful telephone outreach was attempted today to offer the patient information about available care coordination services.  Follow Up Plan:  Additional outreach attempts will be made to offer the patient care coordination information and services.   Encounter Outcome:  No Answer   Care Coordination Interventions:  No, not indicated   Reece Levy, MSW, LCSW Clinical Social Worker [facility] [employer] [facility] [phone]

## 2023-03-14 ENCOUNTER — Ambulatory Visit: Payer: Self-pay

## 2023-03-14 NOTE — Patient Outreach (Signed)
  Care Coordination   Follow Up Visit Note   03/14/2023 Name: Raymond Cuevas MRN: [id number] DOB: 04-Aug-1934  Raymond Cuevas is a 89 y.o. year old male who sees Merri Brunette, MD for primary care. I spoke with  Flossie Buffy by phone today.  What matters to the patients health and wellness today?  Staying healthy    Goals Addressed             This Visit's Progress    Medication Adherence and help with meals       Patient Goals/Self Care Activities: -Patient/Caregiver will self-administer medications as prescribed as evidenced by self-report/primary caregiver report  -Patient/Caregiver will attend all scheduled provider appointments as evidenced by clinician review of documented attendance to scheduled appointments and patient/caregiver report  Food insecurities Patient reports he has meals in the freezer that he warms up.    Patient reports he is taking medications as prescribed and that Darl Pikes sets his pillbox up for him.  Patient feels like he is doing well and able to care for himself.  CM mentioned to patient Darl Pikes.  He states he saw her a couple of days ago. Advised him that CM as tried to reach her but unsuccessful.             SDOH assessments and interventions completed:  Yes     Care Coordination Interventions:  Yes, provided   Follow up plan: Follow up call scheduled for June    Encounter Outcome:  Pt. Visit Completed   Bary Leriche, RN, MSN [facility] Care Management Care Management Coordinator Direct Line [phone]

## 2023-03-14 NOTE — Patient Instructions (Signed)
Visit Information  Thank you for taking time to visit with me today. Please don't hesitate to contact me if I can be of assistance to you.   Following are the goals we discussed today:   Goals Addressed             This Visit's Progress    Medication Adherence and help with meals       Patient Goals/Self Care Activities: -Patient/Caregiver will self-administer medications as prescribed as evidenced by self-report/primary caregiver report  -Patient/Caregiver will attend all scheduled provider appointments as evidenced by clinician review of documented attendance to scheduled appointments and patient/caregiver report  Food insecurities Patient reports he has meals in the freezer that he warms up.    Patient reports he is taking medications as prescribed and that Darl Pikes sets his pillbox up for him.  Patient feels like he is doing well and able to care for himself.  CM mentioned to patient Darl Pikes.  He states he saw her a couple of days ago. Advised him that CM as tried to reach her but unsuccessful.             Our next appointment is by telephone on 04/03/23 at 100pm  Please call the care guide team at [phone] if you need to cancel or reschedule your appointment.   If you are experiencing a Mental Health or Behavioral Health Crisis or need someone to talk to, please call the Suicide and Crisis Lifeline: 988   The patient verbalized understanding of instructions, educational materials, and care plan provided today and DECLINED offer to receive copy of patient instructions, educational materials, and care plan.   The patient has been provided with contact information for the care management team and has been advised to call with any health related questions or concerns.   Bary Leriche, RN, MSN [facility] Care Management Care Management Coordinator Direct Line [phone]

## 2023-03-23 ENCOUNTER — Emergency Department (HOSPITAL_COMMUNITY): Payer: Medicare Other

## 2023-03-23 ENCOUNTER — Inpatient Hospital Stay (HOSPITAL_COMMUNITY)
Admission: EM | Admit: 2023-03-23 | Discharge: 2023-03-28 | DRG: 565 | Disposition: A | Discharge status: Skilled Nursing Facility | Payer: Medicare Other | Attending: Family Medicine | Admitting: Family Medicine

## 2023-03-23 ENCOUNTER — Observation Stay (HOSPITAL_COMMUNITY): Payer: Medicare Other

## 2023-03-23 ENCOUNTER — Other Ambulatory Visit: Payer: Self-pay

## 2023-03-23 ENCOUNTER — Encounter (HOSPITAL_COMMUNITY): Payer: Self-pay

## 2023-03-23 DIAGNOSIS — Z6831 Body mass index (BMI) 31.0-31.9, adult: Secondary | ICD-10-CM | POA: Diagnosis not present

## 2023-03-23 DIAGNOSIS — S51011D Laceration without foreign body of right elbow, subsequent encounter: Secondary | ICD-10-CM | POA: Diagnosis not present

## 2023-03-23 DIAGNOSIS — E669 Obesity, unspecified: Secondary | ICD-10-CM | POA: Diagnosis present

## 2023-03-23 DIAGNOSIS — S51011A Laceration without foreign body of right elbow, initial encounter: Secondary | ICD-10-CM | POA: Diagnosis present

## 2023-03-23 DIAGNOSIS — E785 Hyperlipidemia, unspecified: Secondary | ICD-10-CM | POA: Diagnosis present

## 2023-03-23 DIAGNOSIS — X30XXXA Exposure to excessive natural heat, initial encounter: Secondary | ICD-10-CM | POA: Diagnosis not present

## 2023-03-23 DIAGNOSIS — T50916A Underdosing of multiple unspecified drugs, medicaments and biological substances, initial encounter: Secondary | ICD-10-CM | POA: Diagnosis present

## 2023-03-23 DIAGNOSIS — G8929 Other chronic pain: Secondary | ICD-10-CM | POA: Diagnosis present

## 2023-03-23 DIAGNOSIS — I350 Nonrheumatic aortic (valve) stenosis: Secondary | ICD-10-CM

## 2023-03-23 DIAGNOSIS — E440 Moderate protein-calorie malnutrition: Secondary | ICD-10-CM | POA: Diagnosis not present

## 2023-03-23 DIAGNOSIS — E878 Other disorders of electrolyte and fluid balance, not elsewhere classified: Secondary | ICD-10-CM

## 2023-03-23 DIAGNOSIS — S199XXA Unspecified injury of neck, initial encounter: Secondary | ICD-10-CM | POA: Diagnosis not present

## 2023-03-23 DIAGNOSIS — Y92009 Unspecified place in unspecified non-institutional (private) residence as the place of occurrence of the external cause: Secondary | ICD-10-CM | POA: Diagnosis not present

## 2023-03-23 DIAGNOSIS — Z79890 Hormone replacement therapy (postmenopausal): Secondary | ICD-10-CM

## 2023-03-23 DIAGNOSIS — R410 Disorientation, unspecified: Secondary | ICD-10-CM | POA: Diagnosis not present

## 2023-03-23 DIAGNOSIS — M1712 Unilateral primary osteoarthritis, left knee: Secondary | ICD-10-CM | POA: Diagnosis present

## 2023-03-23 DIAGNOSIS — N401 Enlarged prostate with lower urinary tract symptoms: Secondary | ICD-10-CM | POA: Diagnosis not present

## 2023-03-23 DIAGNOSIS — D72829 Elevated white blood cell count, unspecified: Secondary | ICD-10-CM | POA: Diagnosis not present

## 2023-03-23 DIAGNOSIS — R062 Wheezing: Secondary | ICD-10-CM | POA: Diagnosis not present

## 2023-03-23 DIAGNOSIS — Z79899 Other long term (current) drug therapy: Secondary | ICD-10-CM

## 2023-03-23 DIAGNOSIS — H9193 Unspecified hearing loss, bilateral: Secondary | ICD-10-CM | POA: Diagnosis not present

## 2023-03-23 DIAGNOSIS — J4489 Other specified chronic obstructive pulmonary disease: Secondary | ICD-10-CM | POA: Diagnosis present

## 2023-03-23 DIAGNOSIS — E860 Dehydration: Secondary | ICD-10-CM | POA: Diagnosis present

## 2023-03-23 DIAGNOSIS — N179 Acute kidney failure, unspecified: Secondary | ICD-10-CM | POA: Diagnosis present

## 2023-03-23 DIAGNOSIS — R7989 Other specified abnormal findings of blood chemistry: Secondary | ICD-10-CM | POA: Diagnosis not present

## 2023-03-23 DIAGNOSIS — T675XXA Heat exhaustion, unspecified, initial encounter: Secondary | ICD-10-CM | POA: Diagnosis present

## 2023-03-23 DIAGNOSIS — Z9842 Cataract extraction status, left eye: Secondary | ICD-10-CM

## 2023-03-23 DIAGNOSIS — R109 Unspecified abdominal pain: Secondary | ICD-10-CM | POA: Diagnosis not present

## 2023-03-23 DIAGNOSIS — Z7982 Long term (current) use of aspirin: Secondary | ICD-10-CM | POA: Diagnosis not present

## 2023-03-23 DIAGNOSIS — I10 Essential (primary) hypertension: Secondary | ICD-10-CM | POA: Diagnosis present

## 2023-03-23 DIAGNOSIS — E872 Acidosis, unspecified: Secondary | ICD-10-CM | POA: Diagnosis not present

## 2023-03-23 DIAGNOSIS — T796XXA Traumatic ischemia of muscle, initial encounter: Principal | ICD-10-CM | POA: Diagnosis present

## 2023-03-23 DIAGNOSIS — M23222 Derangement of posterior horn of medial meniscus due to old tear or injury, left knee: Secondary | ICD-10-CM | POA: Diagnosis present

## 2023-03-23 DIAGNOSIS — M11262 Other chondrocalcinosis, left knee: Secondary | ICD-10-CM | POA: Diagnosis present

## 2023-03-23 DIAGNOSIS — C4492 Squamous cell carcinoma of skin, unspecified: Secondary | ICD-10-CM | POA: Diagnosis present

## 2023-03-23 DIAGNOSIS — Z9049 Acquired absence of other specified parts of digestive tract: Secondary | ICD-10-CM

## 2023-03-23 DIAGNOSIS — R600 Localized edema: Secondary | ICD-10-CM | POA: Diagnosis not present

## 2023-03-23 DIAGNOSIS — Z602 Problems related to living alone: Secondary | ICD-10-CM | POA: Diagnosis present

## 2023-03-23 DIAGNOSIS — R531 Weakness: Secondary | ICD-10-CM | POA: Diagnosis not present

## 2023-03-23 DIAGNOSIS — E876 Hypokalemia: Secondary | ICD-10-CM | POA: Diagnosis present

## 2023-03-23 DIAGNOSIS — Z7401 Bed confinement status: Secondary | ICD-10-CM | POA: Diagnosis not present

## 2023-03-23 DIAGNOSIS — F02A0 Dementia in other diseases classified elsewhere, mild, without behavioral disturbance, psychotic disturbance, mood disturbance, and anxiety: Secondary | ICD-10-CM | POA: Diagnosis present

## 2023-03-23 DIAGNOSIS — S0990XA Unspecified injury of head, initial encounter: Secondary | ICD-10-CM | POA: Diagnosis not present

## 2023-03-23 DIAGNOSIS — Z885 Allergy status to narcotic agent status: Secondary | ICD-10-CM

## 2023-03-23 DIAGNOSIS — J4520 Mild intermittent asthma, uncomplicated: Secondary | ICD-10-CM | POA: Diagnosis not present

## 2023-03-23 DIAGNOSIS — M25462 Effusion, left knee: Secondary | ICD-10-CM | POA: Diagnosis not present

## 2023-03-23 DIAGNOSIS — F0390 Unspecified dementia without behavioral disturbance: Secondary | ICD-10-CM | POA: Diagnosis present

## 2023-03-23 DIAGNOSIS — H9110 Presbycusis, unspecified ear: Secondary | ICD-10-CM | POA: Diagnosis present

## 2023-03-23 DIAGNOSIS — Z882 Allergy status to sulfonamides status: Secondary | ICD-10-CM

## 2023-03-23 DIAGNOSIS — Z466 Encounter for fitting and adjustment of urinary device: Secondary | ICD-10-CM | POA: Diagnosis not present

## 2023-03-23 DIAGNOSIS — M25562 Pain in left knee: Secondary | ICD-10-CM | POA: Diagnosis not present

## 2023-03-23 DIAGNOSIS — K219 Gastro-esophageal reflux disease without esophagitis: Secondary | ICD-10-CM | POA: Diagnosis not present

## 2023-03-23 DIAGNOSIS — E538 Deficiency of other specified B group vitamins: Secondary | ICD-10-CM | POA: Diagnosis not present

## 2023-03-23 DIAGNOSIS — Z96641 Presence of right artificial hip joint: Secondary | ICD-10-CM | POA: Diagnosis present

## 2023-03-23 DIAGNOSIS — R338 Other retention of urine: Secondary | ICD-10-CM | POA: Diagnosis not present

## 2023-03-23 DIAGNOSIS — J8489 Other specified interstitial pulmonary diseases: Secondary | ICD-10-CM | POA: Diagnosis not present

## 2023-03-23 DIAGNOSIS — M6282 Rhabdomyolysis: Secondary | ICD-10-CM | POA: Diagnosis present

## 2023-03-23 DIAGNOSIS — R972 Elevated prostate specific antigen [PSA]: Secondary | ICD-10-CM | POA: Diagnosis present

## 2023-03-23 DIAGNOSIS — E6609 Other obesity due to excess calories: Secondary | ICD-10-CM | POA: Diagnosis not present

## 2023-03-23 DIAGNOSIS — E039 Hypothyroidism, unspecified: Secondary | ICD-10-CM | POA: Diagnosis not present

## 2023-03-23 DIAGNOSIS — W19XXXD Unspecified fall, subsequent encounter: Secondary | ICD-10-CM | POA: Diagnosis not present

## 2023-03-23 DIAGNOSIS — W07XXXA Fall from chair, initial encounter: Secondary | ICD-10-CM | POA: Diagnosis present

## 2023-03-23 DIAGNOSIS — Z5911 Inadequate housing environmental temperature: Secondary | ICD-10-CM

## 2023-03-23 DIAGNOSIS — R001 Bradycardia, unspecified: Secondary | ICD-10-CM | POA: Diagnosis present

## 2023-03-23 DIAGNOSIS — I959 Hypotension, unspecified: Secondary | ICD-10-CM | POA: Diagnosis not present

## 2023-03-23 DIAGNOSIS — W19XXXA Unspecified fall, initial encounter: Secondary | ICD-10-CM | POA: Diagnosis not present

## 2023-03-23 DIAGNOSIS — Z9841 Cataract extraction status, right eye: Secondary | ICD-10-CM

## 2023-03-23 DIAGNOSIS — K5900 Constipation, unspecified: Secondary | ICD-10-CM | POA: Diagnosis not present

## 2023-03-23 DIAGNOSIS — E559 Vitamin D deficiency, unspecified: Secondary | ICD-10-CM | POA: Diagnosis not present

## 2023-03-23 DIAGNOSIS — N320 Bladder-neck obstruction: Secondary | ICD-10-CM | POA: Diagnosis not present

## 2023-03-23 DIAGNOSIS — R339 Retention of urine, unspecified: Secondary | ICD-10-CM | POA: Diagnosis present

## 2023-03-23 DIAGNOSIS — Z91128 Patient's intentional underdosing of medication regimen for other reason: Secondary | ICD-10-CM

## 2023-03-23 DIAGNOSIS — Z96651 Presence of right artificial knee joint: Secondary | ICD-10-CM | POA: Diagnosis present

## 2023-03-23 LAB — BLOOD GAS, VENOUS
Acid-base deficit: 2 mmol/L (ref 0.0–2.0)
Bicarbonate: 23.1 mmol/L (ref 20.0–28.0)
O2 Saturation: 53 %
Patient temperature: 37
pCO2, Ven: 40 mmHg — ABNORMAL LOW (ref 44–60)
pH, Ven: 7.37 (ref 7.25–7.43)
pO2, Ven: 32 mmHg (ref 32–45)

## 2023-03-23 LAB — COMPREHENSIVE METABOLIC PANEL
ALT: 35 U/L (ref 0–44)
ALT: 49 U/L — ABNORMAL HIGH (ref 0–44)
AST: 129 U/L — ABNORMAL HIGH (ref 15–41)
AST: 171 U/L — ABNORMAL HIGH (ref 15–41)
Albumin: 2 g/dL — ABNORMAL LOW (ref 3.5–5.0)
Albumin: 2.7 g/dL — ABNORMAL LOW (ref 3.5–5.0)
Alkaline Phosphatase: 23 U/L — ABNORMAL LOW (ref 38–126)
Alkaline Phosphatase: 33 U/L — ABNORMAL LOW (ref 38–126)
Anion gap: 6 (ref 5–15)
Anion gap: 9 (ref 5–15)
BUN: 21 mg/dL (ref 8–23)
BUN: 27 mg/dL — ABNORMAL HIGH (ref 8–23)
CO2: 14 mmol/L — ABNORMAL LOW (ref 22–32)
CO2: 21 mmol/L — ABNORMAL LOW (ref 22–32)
Calcium: 5.1 mg/dL — CL (ref 8.9–10.3)
Calcium: 7.6 mg/dL — ABNORMAL LOW (ref 8.9–10.3)
Chloride: 120 mmol/L — ABNORMAL HIGH (ref 98–111)
Chloride: 107 mmol/L (ref 98–111)
Creatinine, Ser: 0.76 mg/dL (ref 0.61–1.24)
Creatinine, Ser: 1.35 mg/dL — ABNORMAL HIGH (ref 0.61–1.24)
GFR, Estimated: >60 mL/min (ref >60)
GFR, Estimated: 51 mL/min — ABNORMAL LOW (ref >60)
Glucose, Bld: 77 mg/dL (ref 70–99)
Glucose, Bld: 112 mg/dL — ABNORMAL HIGH (ref 70–99)
Potassium: 2.3 mmol/L — CL (ref 3.5–5.1)
Potassium: 3.5 mmol/L (ref 3.5–5.1)
Sodium: 140 mmol/L (ref 135–145)
Sodium: 137 mmol/L (ref 135–145)
Total Bilirubin: 1.4 mg/dL — ABNORMAL HIGH (ref 0.3–1.2)
Total Bilirubin: 1.5 mg/dL — ABNORMAL HIGH (ref 0.3–1.2)
Total Protein: 3.9 g/dL — ABNORMAL LOW (ref 6.5–8.1)
Total Protein: 5.8 g/dL — ABNORMAL LOW (ref 6.5–8.1)

## 2023-03-23 LAB — URINALYSIS, W/ REFLEX TO CULTURE (INFECTION SUSPECTED)
Bacteria, UA: NONE SEEN
Bilirubin Urine: NEGATIVE
Glucose, UA: NEGATIVE mg/dL
Ketones, ur: 20 mg/dL — AB
Leukocytes,Ua: NEGATIVE
Nitrite: NEGATIVE
Protein, ur: 30 mg/dL — AB
Specific Gravity, Urine: 1.027 (ref 1.005–1.030)
pH: 5 (ref 5.0–8.0)

## 2023-03-23 LAB — CBC WITH DIFFERENTIAL/PLATELET
Abs Immature Granulocytes: 0.08 10*3/uL — ABNORMAL HIGH (ref 0.00–0.07)
Basophils Absolute: 0 10*3/uL (ref 0.0–0.1)
Basophils Relative: 0 %
Eosinophils Absolute: 0 10*3/uL (ref 0.0–0.5)
Eosinophils Relative: 0 %
HCT: 38.8 % — ABNORMAL LOW (ref 39.0–52.0)
Hemoglobin: 12.8 g/dL — ABNORMAL LOW (ref 13.0–17.0)
Immature Granulocytes: 1 %
Lymphocytes Relative: 5 %
Lymphs Abs: 0.7 10*3/uL (ref 0.7–4.0)
MCH: 32.1 pg (ref 26.0–34.0)
MCHC: 33 g/dL (ref 30.0–36.0)
MCV: 97.2 fL (ref 80.0–100.0)
Monocytes Absolute: 1.9 10*3/uL — ABNORMAL HIGH (ref 0.1–1.0)
Monocytes Relative: 14 %
Neutro Abs: 11.1 10*3/uL — ABNORMAL HIGH (ref 1.7–7.7)
Neutrophils Relative %: 80 %
Platelets: 346 10*3/uL (ref 150–400)
RBC: 3.99 MIL/uL — ABNORMAL LOW (ref 4.22–5.81)
RDW: 14 % (ref 11.5–15.5)
WBC: 13.8 10*3/uL — ABNORMAL HIGH (ref 4.0–10.5)
nRBC: 0 % (ref 0.0–0.2)

## 2023-03-23 LAB — LACTIC ACID, PLASMA
Lactic Acid, Venous: 1.1 mmol/L (ref 0.5–1.9)
Lactic Acid, Venous: 1.3 mmol/L (ref 0.5–1.9)

## 2023-03-23 LAB — MAGNESIUM
Magnesium: 1.3 mg/dL — ABNORMAL LOW (ref 1.7–2.4)
Magnesium: 2.3 mg/dL (ref 1.7–2.4)

## 2023-03-23 LAB — CK
Total CK: 2990 U/L — ABNORMAL HIGH (ref 49–397)
Total CK: 3548 U/L — ABNORMAL HIGH (ref 49–397)

## 2023-03-23 LAB — OSMOLALITY: Osmolality: 297 mOsm/kg — ABNORMAL HIGH (ref 275–295)

## 2023-03-23 LAB — CREATININE, URINE, RANDOM: Creatinine, Urine: 323 mg/dL

## 2023-03-23 LAB — SODIUM, URINE, RANDOM: Sodium, Ur: 50 mmol/L

## 2023-03-23 LAB — PHOSPHORUS: Phosphorus: 3.4 mg/dL (ref 2.5–4.6)

## 2023-03-23 LAB — TSH: TSH: 2.675 u[IU]/mL (ref 0.350–4.500)

## 2023-03-23 LAB — AMMONIA: Ammonia: 11 umol/L (ref 9–35)

## 2023-03-23 MED ORDER — ALBUTEROL SULFATE (2.5 MG/3ML) 0.083% IN NEBU: 2.5000 mg | INHALATION_SOLUTION | RESPIRATORY_TRACT | Status: DC | PRN

## 2023-03-23 MED ORDER — CALCIUM GLUCONATE-NACL 2-0.675 GM/100ML-% IV SOLN
2.0000 g | Freq: Once | INTRAVENOUS | Status: DC
  Filled 2023-03-23 (×2): qty 100

## 2023-03-23 MED ORDER — HYDROCODONE-ACETAMINOPHEN 5-325 MG PO TABS
1.0000 | ORAL_TABLET | ORAL | Status: DC | PRN
  Administered 2023-03-23 – 2023-03-28 (×10): 2 via ORAL
  Filled 2023-03-23 (×11): qty 2

## 2023-03-23 MED ORDER — ACETAMINOPHEN 325 MG PO TABS
650.0000 mg | ORAL_TABLET | Freq: Four times a day (QID) | ORAL | Status: DC | PRN
  Filled 2023-03-23: qty 2

## 2023-03-23 MED ORDER — LEVOTHYROXINE SODIUM 25 MCG PO TABS
25.0000 ug | ORAL_TABLET | Freq: Every day | ORAL | Status: DC
  Administered 2023-03-25 – 2023-03-28 (×4): 25 ug via ORAL
  Filled 2023-03-23 (×4): qty 1

## 2023-03-23 MED ORDER — POTASSIUM CHLORIDE 10 MEQ/100ML IV SOLN
10.0000 meq | INTRAVENOUS | Status: AC
  Administered 2023-03-23 (×2): 10 meq via INTRAVENOUS
  Filled 2023-03-23 (×2): qty 100

## 2023-03-23 MED ORDER — SODIUM CHLORIDE 0.9 % IV BOLUS
1000.0000 mL | Freq: Once | INTRAVENOUS | Status: AC
  Administered 2023-03-23: 1000 mL via INTRAVENOUS

## 2023-03-23 MED ORDER — ALBUTEROL SULFATE HFA 108 (90 BASE) MCG/ACT IN AERS: 2.0000 | INHALATION_SPRAY | RESPIRATORY_TRACT | Status: DC | PRN

## 2023-03-23 MED ORDER — ACETAMINOPHEN 650 MG RE SUPP: 650.0000 mg | Freq: Four times a day (QID) | RECTAL | Status: DC | PRN

## 2023-03-23 MED ORDER — MAGNESIUM SULFATE 2 GM/50ML IV SOLN
2.0000 g | Freq: Once | INTRAVENOUS | Status: AC
  Administered 2023-03-23: 2 g via INTRAVENOUS
  Filled 2023-03-23: qty 50

## 2023-03-23 MED ORDER — IPRATROPIUM-ALBUTEROL 0.5-2.5 (3) MG/3ML IN SOLN
3.0000 mL | Freq: Four times a day (QID) | RESPIRATORY_TRACT | Status: DC
  Administered 2023-03-23: 3 mL via RESPIRATORY_TRACT
  Filled 2023-03-23: qty 3

## 2023-03-23 MED ORDER — ACETAMINOPHEN 500 MG PO TABS
1000.0000 mg | ORAL_TABLET | Freq: Once | ORAL | Status: AC
  Administered 2023-03-23: 1000 mg via ORAL
  Filled 2023-03-23: qty 2

## 2023-03-23 MED ORDER — POTASSIUM CHLORIDE CRYS ER 20 MEQ PO TBCR
40.0000 meq | EXTENDED_RELEASE_TABLET | Freq: Once | ORAL | Status: AC
  Administered 2023-03-23: 40 meq via ORAL
  Filled 2023-03-23: qty 2

## 2023-03-23 MED ORDER — SODIUM CHLORIDE 0.9 % IV SOLN: INTRAVENOUS | Status: DC

## 2023-03-23 MED ORDER — POTASSIUM CHLORIDE 10 MEQ/100ML IV SOLN
10.0000 meq | INTRAVENOUS | Status: DC
  Administered 2023-03-23: 10 meq via INTRAVENOUS
  Filled 2023-03-23: qty 100

## 2023-03-23 MED ORDER — FINASTERIDE 5 MG PO TABS
5.0000 mg | ORAL_TABLET | Freq: Every day | ORAL | Status: DC
  Administered 2023-03-23 – 2023-03-28 (×6): 5 mg via ORAL
  Filled 2023-03-23 (×6): qty 1

## 2023-03-23 MED ORDER — MEMANTINE HCL 10 MG PO TABS
10.0000 mg | ORAL_TABLET | Freq: Two times a day (BID) | ORAL | Status: DC
  Administered 2023-03-24 – 2023-03-28 (×8): 10 mg via ORAL
  Filled 2023-03-23 (×8): qty 1

## 2023-03-23 MED ORDER — ONDANSETRON HCL 4 MG/2ML IJ SOLN: 4.0000 mg | Freq: Four times a day (QID) | INTRAMUSCULAR | Status: DC | PRN

## 2023-03-23 MED ORDER — ONDANSETRON HCL 4 MG PO TABS: 4.0000 mg | ORAL_TABLET | Freq: Four times a day (QID) | ORAL | Status: DC | PRN

## 2023-03-23 MED ORDER — PANTOPRAZOLE SODIUM 40 MG PO TBEC
40.0000 mg | DELAYED_RELEASE_TABLET | Freq: Every day | ORAL | Status: DC
  Administered 2023-03-25 – 2023-03-28 (×4): 40 mg via ORAL
  Filled 2023-03-23 (×4): qty 1

## 2023-03-23 NOTE — Assessment & Plan Note (Addendum)
Continue Namenda and monitor for any sign of sundowning

## 2023-03-23 NOTE — ED Provider Notes (Signed)
[place] EMERGENCY DEPARTMENT AT [facility] Provider Note  CSN: [id number] Arrival date & time: 03/23/23 4098  Chief Complaint(s) Fall  HPI Raymond Cuevas is a 89 y.o. male with history of COPD, hypertension, hyperlipidemia, aortic stenosis, lives alone presenting to the emergency department after fall.  The patient was apparently at home, fell while trying to get up from a chair.  Patient reports he was on the ground for around 6 hours.  Fire department was eventually notified and they reported when they got there the house was 94 degrees and patient was not able to stand.  They needed significant assistance to get the patient onto a gurney.  Patient lives alone and apparently his normal caregiver has not been able to help as much at home as they have had a stroke.  Patient reports primarily pain to the left knee.  He does not think he hit his head.  No fevers or chills or other symptoms.  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 => TTE 02/02/2022: EF> 75%.  Moderate LVH.  GR 1 DD.  Normal RV and RVP.  Severe AS.  Mean gradient 44 mmHg up from 37 mmHg   Patient Active Problem List   Diagnosis Date Noted   Rhabdomyolysis 03/23/2023   Dementia without behavioral disturbance (HCC) 03/23/2023   Hypokalemia 03/23/2023   Acute urinary retention 03/23/2023   Elevated LFTs 03/23/2023   AKI (acute kidney injury) (HCC) 03/23/2023   Goals of care, counseling/discussion 02/28/2022   Severe aortic stenosis by prior echocardiogram 02/02/2022   Hyperlipidemia LDL  goal <100 09/25/2020   Dyspnea on exertion 01/21/2017   Essential hypertension 01/21/2017   Morbid obesity (HCC) 08/28/2013   S/P right THA, AA 08/27/2013   Home Medication(s) Prior to Admission medications   Medication Sig Start Date End Date Taking? Authorizing Provider  albuterol (VENTOLIN HFA) 108 (90 Base) MCG/ACT inhaler INHALE 2 PUFFS BY MOUTH AS NEEDED 4 TIMES DAILY UNTIL WHEEZING EXTINGUISHED. RINSE AND SPIT AFTER USE. DEMONSTRATE PROPER TECHNIQUE INHALATI 08/18/21   Mannam, Praveen, MD  Ascorbic Acid (VITAMIN C) 1000 MG tablet Take 1,000 mg by mouth daily.    [provider]  aspirin EC 81 MG tablet Take 81 mg by mouth daily.    [provider]  Calcium Carb-Cholecalciferol 600-20 MG-MCG TABS Take 1 tablet by mouth daily.    [provider]  ipratropium-albuterol (DUONEB) 0.5-2.5 (3) MG/3ML SOLN Take 3 mLs by nebulization every 6 (six) hours as needed. 04/05/22   Chilton Greathouse, MD  levothyroxine (SYNTHROID) 25 MCG tablet Take 25 mcg by mouth daily before breakfast. 07/06/21   [provider]  losartan (COZAAR) 25 MG tablet Take 25 mg by mouth daily.    [provider]  magnesium hydroxide (MILK OF MAGNESIA) 400 MG/5ML suspension Take 30 mLs by mouth daily as needed for moderate constipation or heartburn.    [provider]  memantine (NAMENDA) 10 MG tablet Take 1 tablet (10 mg total) by mouth 2 (two) times daily. 04/13/22 05/08/23  Windell Norfolk, MD  omeprazole (  PRILOSEC OTC) 20 MG tablet Take 20 mg by mouth daily.    [provider]  rosuvastatin (CRESTOR) 10 MG tablet Take 10 mg by mouth daily.    [provider]  vitamin B-12 (CYANOCOBALAMIN) 1000 MCG tablet Take 1,000 mcg by mouth daily.    [provider]                                                                                                                                    Past Surgical History Past Surgical History:  Procedure Laterality  Date   CATARACT EXTRACTION, BILATERAL Bilateral    CHOLECYSTECTOMY     laparoscopic   EYE SURGERY Left    hole repair   JOINT REPLACEMENT Right    Knee   NM MYOVIEW LTD  11/2016    normal EF (55 to 65%).  No ischemia or infarction.  LOW RISK.   REPLACEMENT TOTAL KNEE     RIGHT/LEFT HEART CATH AND CORONARY ANGIOGRAPHY N/A 03/09/2022   Procedure: RIGHT/LEFT HEART CATH AND CORONARY ANGIOGRAPHY;  Surgeon: Marykay Lex, MD;  Location: [facility] INVASIVE CV LAB;  Service: Cardiovascular;  Laterality: N/A;   TONSILLECTOMY     TOTAL HIP ARTHROPLASTY Right 08/27/2013   Procedure: RIGHT TOTAL HIP ARTHROPLASTY ANTERIOR APPROACH;  Surgeon: [name], MD;  Location: [facility] ORS;  Service: Orthopedics;  Laterality: Right;   TRANSTHORACIC ECHOCARDIOGRAM  12/2017   a) 3/'19: Mild LVH.  Nl EF 55-60%.  GR 1 DD.  No RWMA.  Mild AS (MG 21 mm); Normal LV size, fxn w/o WMA.  Mod Conc VH.  EF 55-60%.  GR 1 DD.  NlRV size & fxn. Mod AS (MG  21 mmHg); b) 12/2019: Mild AS - MG 21 mmHg; c) 07/2021: EF 60-65%. No RWMA. Mod AS (MG 35 mmHg, PG 37 mmHg)   TRANSTHORACIC ECHOCARDIOGRAM  02/02/2022   EF> 75%.  Moderate LVH.  GR 1 DD.  Normal RV and RVP.  Severe aortic valve calcification with SEVERE AS.  MG 44 mmHg up from 37 mmHg; AVA by VTI 1.05 cm.   Family History Family History  Problem Relation Age of Onset   Sudden death Sister 61    Social History Social History   Tobacco Use   Smoking status: Never   Smokeless tobacco: Never  Vaping Use   Vaping Use: Never used  Substance Use Topics   Alcohol use: Yes    Alcohol/week: 1.0 standard drink of alcohol    Types: 1 Cans of beer per week    Comment: rarely   Drug use: No   Allergies Codeine, Morphine and codeine, and Sulfa antibiotics  Review of Systems Review of Systems  All other systems reviewed and are negative.   Physical Exam Vital Signs  I have reviewed the triage vital signs BP 117/66   Pulse 76   Temp 97.8 F (36.6 C) (Oral)   Resp 18  Ht 6\' 2"  (1.88 m)   Wt 111 kg   SpO2 98%   BMI 31.42 kg/m  Physical Exam Vitals and nursing note reviewed.  Constitutional:      General: He is not in acute distress.    Appearance: Normal appearance.  HENT:     Mouth/Throat:     Mouth: Mucous membranes are moist.  Eyes:     Conjunctiva/sclera: Conjunctivae normal.  Cardiovascular:     Rate and Rhythm: Normal rate and regular rhythm.     Heart sounds: Murmur heard.  Pulmonary:     Effort: Pulmonary effort is normal. No respiratory distress.     Breath sounds: Normal breath sounds.  Abdominal:     General: Abdomen is flat.     Palpations: Abdomen is soft.     Tenderness: There is no abdominal tenderness.  Musculoskeletal:     Comments: Mild tenderness and swelling to the left knee, painful range of motion.  Otherwise, no midline C, T, L-spine tenderness.  No focal bony tenderness or limited range of motion to the remainder of the upper and lower extremities.   Skin:    General: Skin is warm and dry.     Capillary Refill: Capillary refill takes less than 2 seconds.     Comments: Small skin tear to the right elbow without underlying bony tenderness  Neurological:     Mental Status: He is alert and oriented to person, place, and time. Mental status is at baseline.  Psychiatric:        Mood and Affect: Mood normal.        Behavior: Behavior normal.     ED Results and Treatments Labs (all labs ordered are listed, but only abnormal results are displayed) Labs Reviewed  COMPREHENSIVE METABOLIC PANEL - Abnormal; Notable for the following components:      Result Value   Potassium 2.3 (*)    Chloride 120 (*)    CO2 14 (*)    Calcium 5.1 (*)    Total Protein 3.9 (*)    Albumin 2.0 (*)    AST 129 (*)    Alkaline Phosphatase 23 (*)    Total Bilirubin 1.4 (*)    All other components within normal limits  CBC WITH DIFFERENTIAL/PLATELET - Abnormal; Notable for the following components:   WBC 13.8 (*)    RBC 3.99 (*)     Hemoglobin 12.8 (*)    HCT 38.8 (*)    Neutro Abs 11.1 (*)    Monocytes Absolute 1.9 (*)    Abs Immature Granulocytes 0.08 (*)    All other components within normal limits  CK - Abnormal; Notable for the following components:   Total CK 2,990 (*)    All other components within normal limits  MAGNESIUM - Abnormal; Notable for the following components:   Magnesium 1.3 (*)    All other components within normal limits  COMPREHENSIVE METABOLIC PANEL - Abnormal; Notable for the following components:   CO2 21 (*)    Glucose, Bld 112 (*)    BUN 27 (*)    Creatinine, Ser 1.35 (*)    Calcium 7.6 (*)    Total Protein 5.8 (*)    Albumin 2.7 (*)    AST 171 (*)    ALT 49 (*)    Alkaline Phosphatase 33 (*)    Total Bilirubin 1.5 (*)    GFR, Estimated 51 (*)    All other components within normal limits  CK - Abnormal; Notable for  the following components:   Total CK 3,548 (*)    All other components within normal limits  BLOOD GAS, VENOUS - Abnormal; Notable for the following components:   pCO2, Ven 40 (*)    All other components within normal limits  MAGNESIUM  PHOSPHORUS  LACTIC ACID, PLASMA  AMMONIA  URINALYSIS, W/ REFLEX TO CULTURE (INFECTION SUSPECTED)  LACTIC ACID, PLASMA  TSH  CK  CALCIUM, IONIZED  OSMOLALITY, URINE  OSMOLALITY  CREATININE, URINE, RANDOM  SODIUM, URINE, RANDOM                                                                                                                          Radiology DG Abd 1 View  Result Date: 03/23/2023 CLINICAL DATA:  Abdominal pain and constipation EXAM: ABDOMEN - 1 VIEW COMPARISON:  None Available. FINDINGS: Relative paucity of bowel gas is noted. No obstructive changes are seen. No free air is noted. Degenerative changes of lumbar spine are seen. No significant retained fecal material is noted. Right hip replacement is seen. IMPRESSION: No significant changes of constipation. Electronically Signed   By: Alcide Clever M.D.   On:  03/23/2023 20:07   CT Knee Left Wo Contrast  Result Date: 03/23/2023 CLINICAL DATA:  Weakness.  Knee trauma.  Fall EXAM: CT OF THE LEFT KNEE WITHOUT CONTRAST TECHNIQUE: Multidetector CT imaging of the left knee was performed according to the standard protocol. Multiplanar CT image reconstructions were also generated. RADIATION DOSE REDUCTION: This exam was performed according to the departmental dose-optimization program which includes automated exposure control, adjustment of the mA and/or kV according to patient size and/or use of iterative reconstruction technique. COMPARISON:  X-ray earlier 03/23/2023. FINDINGS: Bones/Joint/Cartilage No fracture identified. No dislocation. As seen on x-ray there are osteophytes seen of all 3 compartments, greatest of the lateral compartment. There is peaking of the tibial spines. Mild degenerative changes as well along the proximal tibiofibular joint some sclerosis and small osteophytes. Subchondral cyst formation identified along the posterior aspect of the medial tibial plateau towards the midline. Few other areas as well along the tibial plateau of both mediolateral compartments. Ligaments Suboptimally assessed by CT. Muscles and Tendons Areas of fatty muscle atrophy diffusely. Preserved patellar and quadriceps tendon. Soft tissues Chondrocalcinosis. Small joint effusion. Vascular calcifications are seen about the knee. Mild subcutaneous edema anteriorly. IMPRESSION: Tricompartmental degenerative changes. Chondrocalcinosis. Small joint effusion. Overall if there is further concern of internal derangement dedicated MRI imaging can be considered if the patient is clinically compatible and when clinically appropriate Electronically Signed   By: Karen Kays M.D.   On: 03/23/2023 18:06   DG Chest Port 1 View  Result Date: 03/23/2023 CLINICAL DATA:  Weakness. EXAM: PORTABLE CHEST 1 VIEW COMPARISON:  Chest radiograph 12/27/2022. FINDINGS: Low lung volumes accentuate the  pulmonary vasculature and cardiomediastinal silhouette. No consolidation or pulmonary edema. No pleural effusion or pneumothorax. Visualized bones and upper abdomen are unremarkable. IMPRESSION: No evidence of acute cardiopulmonary disease. Electronically Signed  By: Orvan Falconer M.D.   On: 03/23/2023 16:53   DG Knee Complete 4 Views Left  Result Date: 03/23/2023 CLINICAL DATA:  Weakness. EXAM: LEFT KNEE - COMPLETE 4+ VIEW COMPARISON:  Left knee radiographs 09/16/2019. FINDINGS: Four views of the left knee. No acute fracture or dislocation. Possible small knee joint effusion. 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 Atherosclerotic vascular changes in the surrounding soft tissues. IMPRESSION: 1. No acute fracture or dislocation of the left knee. 2. 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 Electronically Signed   By: Orvan Falconer M.D.   On: 03/23/2023 16:44   CT Head Wo Contrast  Result Date: 03/23/2023 CLINICAL DATA:  Head trauma, minor (Age >= 65y); Neck trauma (Age >= 65y). Fall. EXAM: CT HEAD WITHOUT CONTRAST CT CERVICAL SPINE WITHOUT CONTRAST TECHNIQUE: Multidetector CT imaging of the head and cervical spine was performed following the standard protocol without intravenous contrast. Multiplanar CT image reconstructions of the cervical spine were also generated. RADIATION DOSE REDUCTION: This exam was performed according to the departmental dose-optimization program which includes automated exposure control, adjustment of the mA and/or kV according to patient size and/or use of iterative reconstruction technique. COMPARISON:  Head CT 05/12/2022. FINDINGS: CT HEAD FINDINGS Brain: No acute intracranial hemorrhage. Gray-white differentiation is preserved. No hydrocephalus or extra-axial collection. No mass effect or midline shift. Vascular: No hyperdense vessel or unexpected calcification. Skull: No calvarial fracture or suspicious bone  lesion. Skull base is unremarkable. Sinuses/Orbits: Unremarkable. Other: Retained metallic fragment superior to the left ear. CT CERVICAL SPINE FINDINGS Alignment: Trace degenerative retrolisthesis of C3 on C4. Skull base and vertebrae: No acute fracture. Normal craniocervical junction. No suspicious bone lesions. Soft tissues and spinal canal: No prevertebral fluid or swelling. No visible canal hematoma. Disc levels: Mild cervical spondylosis without high-grade spinal canal stenosis. Upper chest: Unremarkable. Other: None. IMPRESSION: 1. No acute intracranial abnormality. 2. No acute cervical spine fracture or traumatic malalignment. Electronically Signed   By: Orvan Falconer M.D.   On: 03/23/2023 16:34   CT Cervical Spine Wo Contrast  Result Date: 03/23/2023 CLINICAL DATA:  Head trauma, minor (Age >= 65y); Neck trauma (Age >= 65y). Fall. EXAM: CT HEAD WITHOUT CONTRAST CT CERVICAL SPINE WITHOUT CONTRAST TECHNIQUE: Multidetector CT imaging of the head and cervical spine was performed following the standard protocol without intravenous contrast. Multiplanar CT image reconstructions of the cervical spine were also generated. RADIATION DOSE REDUCTION: This exam was performed according to the departmental dose-optimization program which includes automated exposure control, adjustment of the mA and/or kV according to patient size and/or use of iterative reconstruction technique. COMPARISON:  Head CT 05/12/2022. FINDINGS: CT HEAD FINDINGS Brain: No acute intracranial hemorrhage. Gray-white differentiation is preserved. No hydrocephalus or extra-axial collection. No mass effect or midline shift. Vascular: No hyperdense vessel or unexpected calcification. Skull: No calvarial fracture or suspicious bone lesion. Skull base is unremarkable. Sinuses/Orbits: Unremarkable. Other: Retained metallic fragment superior to the left ear. CT CERVICAL SPINE FINDINGS Alignment: Trace degenerative retrolisthesis of C3 on C4. Skull base  and vertebrae: No acute fracture. Normal craniocervical junction. No suspicious bone lesions. Soft tissues and spinal canal: No prevertebral fluid or swelling. No visible canal hematoma. Disc levels: Mild cervical spondylosis without high-grade spinal canal stenosis. Upper chest: Unremarkable. Other: None. IMPRESSION: 1. No acute intracranial abnormality. 2. No acute cervical spine fracture or traumatic malalignment. Electronically Signed   By: Orvan Falconer M.D.   On: 03/23/2023 16:34    Pertinent labs & imaging results that  were available during my care of the patient were reviewed by me and considered in my medical decision making (see MDM for details).  Medications Ordered in ED Medications  calcium gluconate 2 g/ 100 mL sodium chloride IVPB (has no administration in time range)  finasteride (PROSCAR) tablet 5 mg (has no administration in time range)  potassium chloride 10 mEq in 100 mL IVPB (has no administration in time range)  sodium chloride 0.9 % bolus 1,000 mL (0 mLs Intravenous Stopped 03/23/23 1954)  acetaminophen (TYLENOL) tablet 1,000 mg (1,000 mg Oral Given 03/23/23 1620)  potassium chloride SA (KLOR-CON M) CR tablet 40 mEq (40 mEq Oral Given 03/23/23 1844)  magnesium sulfate IVPB 2 g 50 mL (0 g Intravenous Stopped 03/23/23 1954)                                                                                                                                     Procedures Procedures  (including critical care time)  Medical Decision Making / ED Course   MDM:  89 year old male presenting to the emergency department after fall at home.  Patient does have history of dementia, but oriented.  He does have tenderness to the left knee.  He did have a slip and fall today while trying to get out of a chair.  Apparently was on the ground and also had environmental heat exposure due to the lack of air conditioning being on.  Will check labs including CK given fall.  Patient does not think he  hit his head but given dementia will obtain CT head and neck.  Will also obtain x-ray of the left knee.  Will give IV fluids and Tylenol.  Also check basic labs and urinalysis given increasing weakness.  Patient was not able to ambulate with paramedics, lives alone, may need placement or admission depending on results of workup and discussion with patient and family members.  Clinical Course as of 03/23/23 2055  Thu Mar 23, 2023  1909 P [WS]  1945 X-rays negative for acute injury.  Obtain CT knee given tenderness, no evidence of fracture.  Low concern for acute septic joint, pain began after trauma, does have mild effusion but he is able to range his knee.  Laboratory testing notable for significant electrolyte derangements with potassium of 2.3, calcium of 5.1, hypomagnesemia.  Patient also has elevated CK concerning for rhabdomyolysis.  Given this picture patient will need to be admitted for electrolyte repletion and further monitoring. [WS]    Clinical Course User Index [WS] Lonell Grandchild, MD     Additional history obtained: -Additional history obtained from ems -External records from outside source obtained and reviewed including: Chart review including previous notes, labs, imaging, consultation notes including prior ER visits and echocardiogram   Lab Tests: -I ordered, reviewed, and interpreted labs.   The pertinent results include:   Labs Reviewed  COMPREHENSIVE METABOLIC PANEL - Abnormal; Notable for  the following components:      Result Value   Potassium 2.3 (*)    Chloride 120 (*)    CO2 14 (*)    Calcium 5.1 (*)    Total Protein 3.9 (*)    Albumin 2.0 (*)    AST 129 (*)    Alkaline Phosphatase 23 (*)    Total Bilirubin 1.4 (*)    All other components within normal limits  CBC WITH DIFFERENTIAL/PLATELET - Abnormal; Notable for the following components:   WBC 13.8 (*)    RBC 3.99 (*)    Hemoglobin 12.8 (*)    HCT 38.8 (*)    Neutro Abs 11.1 (*)    Monocytes  Absolute 1.9 (*)    Abs Immature Granulocytes 0.08 (*)    All other components within normal limits  CK - Abnormal; Notable for the following components:   Total CK 2,990 (*)    All other components within normal limits  MAGNESIUM - Abnormal; Notable for the following components:   Magnesium 1.3 (*)    All other components within normal limits  COMPREHENSIVE METABOLIC PANEL - Abnormal; Notable for the following components:   CO2 21 (*)    Glucose, Bld 112 (*)    BUN 27 (*)    Creatinine, Ser 1.35 (*)    Calcium 7.6 (*)    Total Protein 5.8 (*)    Albumin 2.7 (*)    AST 171 (*)    ALT 49 (*)    Alkaline Phosphatase 33 (*)    Total Bilirubin 1.5 (*)    GFR, Estimated 51 (*)    All other components within normal limits  CK - Abnormal; Notable for the following components:   Total CK 3,548 (*)    All other components within normal limits  BLOOD GAS, VENOUS - Abnormal; Notable for the following components:   pCO2, Ven 40 (*)    All other components within normal limits  MAGNESIUM  PHOSPHORUS  LACTIC ACID, PLASMA  AMMONIA  URINALYSIS, W/ REFLEX TO CULTURE (INFECTION SUSPECTED)  LACTIC ACID, PLASMA  TSH  CK  CALCIUM, IONIZED  OSMOLALITY, URINE  OSMOLALITY  CREATININE, URINE, RANDOM  SODIUM, URINE, RANDOM    Notable for hypokalemia, hypocalcemia, hypomagnesemia. Pending UA at time of admission  EKG   EKG Interpretation  Date/Time:  Thursday March 23 2023 15:48:18 EDT Ventricular Rate:  77 PR Interval:  174 QRS Duration: 89 QT Interval:  380 QTC Calculation: 430 R Axis:   43 Text Interpretation: Sinus rhythm Confirmed by Alvino Blood ([id number]) on 03/23/2023 7:09:42 PM         Imaging Studies ordered: I ordered imaging studies including CXR, CT head, XR knee, CT knee On my interpretation imaging demonstrates no acute process I independently visualized and interpreted imaging. I agree with the radiologist interpretation   Medicines ordered and prescription  drug management: Meds ordered this encounter  Medications   sodium chloride 0.9 % bolus 1,000 mL   acetaminophen (TYLENOL) tablet 1,000 mg   potassium chloride SA (KLOR-CON M) CR tablet 40 mEq   DISCONTD: potassium chloride 10 mEq in 100 mL IVPB   calcium gluconate 2 g/ 100 mL sodium chloride IVPB   magnesium sulfate IVPB 2 g 50 mL   finasteride (PROSCAR) tablet 5 mg   potassium chloride 10 mEq in 100 mL IVPB    -I have reviewed the patients home medicines and have made adjustments as needed   Consultations Obtained: I requested consultation with the  hospitalist,  and discussed lab and imaging findings as well as pertinent plan - they recommend: admission   Cardiac Monitoring: The patient was maintained on a cardiac monitor.  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 => TTE 02/02/2022: EF> 75%.  Moderate LVH.  GR 1 DD.  Normal RV and RVP.  Severe AS.  Mean gradient 44 mmHg up from 37 mmHg      Dispostion: Disposition decision including need for hospitalization was considered, and patient admitted to the hospital.    Final Clinical Impression(s) / ED Diagnoses Final diagnoses:   Traumatic rhabdomyolysis, initial encounter [facility])  Electrolyte disturbance  Fall in home, initial encounter     This chart was dictated using voice recognition software.  Despite best efforts to proofread,  errors can occur which can change the documentation meaning.    Lonell Grandchild, MD 03/23/23 2055

## 2023-03-23 NOTE — Assessment & Plan Note (Signed)
Given elevated CK and LFTs bump will hold off on statin for tonight

## 2023-03-23 NOTE — Assessment & Plan Note (Addendum)
Place Foley and started on proscar given allergy to sulfa  Willl need Urology follow up

## 2023-03-23 NOTE — ED Triage Notes (Addendum)
Patient BIB [facility] from home. Patient has dementia and lives alone. Fell after trying to get up from a chair. Was on the floor for 5 hours in a 94 degree house. Complaining of left knee pain. Not on a blood thinner. Skin tear to right elbow.

## 2023-03-23 NOTE — Assessment & Plan Note (Addendum)
In the setting of rhabdomyolysis Rehydrate and repeat

## 2023-03-23 NOTE — Subjective & Objective (Signed)
Pt was found down in his home lives alone House noted to be hot up to 90 degrees Supposedly had a mechanical fall has been weaker than usual  Spent on the floor around 6h

## 2023-03-23 NOTE — Assessment & Plan Note (Signed)
No evidence of syncope.  Patient states it was a mechanical fall.  No chest pains or shortness of breath appears to be well compensated continue to monitor

## 2023-03-23 NOTE — Assessment & Plan Note (Signed)
Allow permissive hypertension for tonight 

## 2023-03-23 NOTE — H&P (Signed)
NIXIN COUSIN ZOX:[id number] DOB: 1933/12/04 DOA: 03/23/2023     PCP: Merri Brunette, MD   Outpatient Specialists:  CARDS:  Dr. Bryan Lemma, MD    Patient arrived to ER on 03/23/23 at 1532 Referred by Attending Lonell Grandchild, MD   Patient coming from:    home Lives alone,     Chief Complaint:   Chief Complaint  Patient presents with   Fall    HPI: Raymond Cuevas is a 89 y.o. male with medical history significant of COPD, severe aortic stenosis, HTN, HLD, obesity, dementia, GERD, hypothyrodism    Presented with  fall, fatigue Pt was found down in his home lives alone House noted to be hot up to 90 degrees Supposedly had a mechanical fall has been weaker than usual  Spent on the floor around 6h    He remmembers he got up to fix him his breakfast when he fell bc his left knee gave out   Reports intermittent problmes with his knee had right knee repalced in the past. Pt though he was coming to ER to have his Left knee repalced In our records had Right hip replaced by Dr. Charlann Boxer in 2014 Walks with a walker at baseline reports when he fell down he had a water bottle on him so he continued to drink.  But he has not urinated all day usually has no problems urinating but has not been able to produce urine    Denies significant ETOH intake   Does not smoke    Lab Results  Component Value Date   SARSCOV2NAA NEGATIVE 12/27/2022       Regarding pertinent Chronic problems:     Hyperlipidemia - on statins Crestor     HTN on Cozaar   chronic CHF diastolic  - last echo 2023 Recent Results (from the past 81191 hour(s))  ECHOCARDIOGRAM COMPLETE   Collection Time: 02/02/22  2:26 PM  Result Value   Area-P 1/2 2.42   S' Lateral 3.20   AV Area mean vel 0.98   AR max vel 1.02   AV Area VTI 1.05   P 1/2 time 507   Ao pk vel 4.31   AV Mean grad 44.0   AV Peak grad 74.3   Narrative      ECHOCARDIOGRAM REPORT        IMPRESSIONS    1. Left ventricular  ejection fraction, by estimation, is >75%. The left ventricle has hyperdynamic function. The left ventricle has no regional wall motion abnormalities. There is moderate left ventricular hypertrophy. Left ventricular diastolic  parameters are consistent with Grade I diastolic dysfunction (impaired relaxation).  2. Right ventricular systolic function is normal. The right ventricular size is normal. There is normal pulmonary artery systolic pressure. The estimated right ventricular systolic pressure is 27.2 mmHg.  3. The mitral valve is grossly normal. Trivial mitral valve regurgitation.  4. The aortic valve is tricuspid. There is severe calcifcation of the aortic valve. There is moderate thickening of the aortic valve. Aortic valve regurgitation is mild. Severe aortic valve stenosis. Aortic regurgitation PHT measures 507 msec. Aortic  valve area, by VTI measures 1.05 cm. Aortic valve mean gradient measures 44.0 mmHg. Aortic valve Vmax measures 4.31 m/s. DI is 0.30.  5. The inferior vena cava is normal in size with greater than 50% respiratory variability, suggesting right atrial pressure of 3 mmHg.  6. Cannot exclude a small PFO.  Comparison(s): Changes from prior study are noted. 07/26/2021: LVEF 60-65%, moderate  AS with mean gradient 35 mmHg.             Hypothyroidism:  on synthroid   obesity-   BMI Readings from Last 1 Encounters:  03/23/23 31.42 kg/m       COPD - not  followed by pulmonology   not  on baseline oxygen   L,      While in ER: Clinical Course as of 03/23/23 2012  Thu Mar 23, 2023  1909 P [WS]  1945 X-rays negative for acute injury.  Obtain CT knee given tenderness, no evidence of fracture.  Low concern for acute septic joint, pain began after trauma, does have mild effusion but he is able to range his knee.  Laboratory testing notable for significant electrolyte derangements with potassium of 2.3, calcium of 5.1, hypomagnesemia.  Patient also has elevated CK concerning  for rhabdomyolysis.  Given this picture patient will need to be admitted for electrolyte repletion and further monitoring. [WS]    Clinical Course User Index [WS] Lonell Grandchild, MD       Lab Orders         Comprehensive metabolic panel         CBC with Differential         Urinalysis, w/ Reflex to Culture (Infection Suspected) -Urine, Clean Catch         CK         Magnesium      CT HEAD/neck   NON acute   Knee   1. No acute fracture or dislocation of the left knee. 2. Tricompartmental osteoarthritis, worst in the patellofemoral compartment.-  CXR -  NON acute    Following Medications were ordered in ER: Medications  potassium chloride 10 mEq in 100 mL IVPB (10 mEq Intravenous New Bag/Given 03/23/23 1845)  calcium gluconate 2 g/ 100 mL sodium chloride IVPB (has no administration in time range)  magnesium sulfate IVPB 2 g 50 mL (2 g Intravenous New Bag/Given 03/23/23 1853)  sodium chloride 0.9 % bolus 1,000 mL (1,000 mLs Intravenous New Bag/Given 03/23/23 1625)  acetaminophen (TYLENOL) tablet 1,000 mg (1,000 mg Oral Given 03/23/23 1620)  potassium chloride SA (KLOR-CON M) CR tablet 40 mEq (40 mEq Oral Given 03/23/23 1844)       ED Triage Vitals  Enc Vitals Group     BP 03/23/23 1545 (!) 109/58     Pulse Rate 03/23/23 1545 80     Resp 03/23/23 1545 19     Temp 03/23/23 1545 99 F (37.2 C)     Temp Source 03/23/23 1545 Oral     SpO2 03/23/23 1545 96 %     Weight 03/23/23 1543 244 lb 11.4 oz (111 kg)     Height 03/23/23 1543 6\' 2"  (1.88 m)     Head Circumference --      Peak Flow --      Pain Score 03/23/23 1543 6     Pain Loc --      Pain Edu? --      Excl. in GC? --   TMAX(24)@     _________________________________________ Significant initial  Findings: Abnormal Labs Reviewed  COMPREHENSIVE METABOLIC PANEL - Abnormal; Notable for the following components:      Result Value   Potassium 2.3 (*)    Chloride 120 (*)    CO2 14 (*)    Calcium 5.1 (*)    Total Protein  3.9 (*)    Albumin 2.0 (*)    AST 129 (*)  Alkaline Phosphatase 23 (*)    Total Bilirubin 1.4 (*)    All other components within normal limits  CBC WITH DIFFERENTIAL/PLATELET - Abnormal; Notable for the following components:   WBC 13.8 (*)    RBC 3.99 (*)    Hemoglobin 12.8 (*)    HCT 38.8 (*)    Neutro Abs 11.1 (*)    Monocytes Absolute 1.9 (*)    Abs Immature Granulocytes 0.08 (*)    All other components within normal limits  CK - Abnormal; Notable for the following components:   Total CK 2,990 (*)    All other components within normal limits  MAGNESIUM - Abnormal; Notable for the following components:   Magnesium 1.3 (*)    All other components within normal limits      _________________________ Troponin   Cardiac Panel (last 3 results) Recent Labs    03/23/23 1623  CKTOTAL 2,990*     ECG: Ordered Personally reviewed and interpreted by me showing: HR : 77 Rhythm:  NSR,     no evidence of ischemic changes QTC 430  BNP (last 3 results) Recent Labs    12/27/22 1039  BNP 136.3*     COVID-19 Labs  No results for input(s): "DDIMER", "FERRITIN", "LDH", "CRP" in the last 72 hours.  Lab Results  Component Value Date   SARSCOV2NAA NEGATIVE 12/27/2022     The recent clinical data is shown below. Vitals:   03/23/23 1543 03/23/23 1545 03/23/23 1640 03/23/23 1700  BP:  (!) 109/58 (!) 124/54 115/60  Pulse:  80 75 73  Resp:  19 15 14   Temp:  99 F (37.2 C)    TempSrc:  Oral    SpO2:  96% 98% 98%  Weight: 111 kg     Height: 6\' 2"  (1.88 m)       WBC     Component Value Date/Time   WBC 13.8 (H) 03/23/2023 1623   LYMPHSABS 0.7 03/23/2023 1623   MONOABS 1.9 (H) 03/23/2023 1623   EOSABS 0.0 03/23/2023 1623   BASOSABS 0.0 03/23/2023 1623    Lactic Acid, Venous No results found for: "LATICACIDVEN"    UA  ordered  _____ _________________________________________________ Recent Labs  Lab 03/23/23 1623 03/23/23 1930  NA 140 137  K 2.3* 3.5  CO2 14*  21*  GLUCOSE 77 112*  BUN 21 27*  CREATININE 0.76 1.35*  CALCIUM 5.1* 7.6*  MG 1.3* 2.3  PHOS  --  3.4    Cr  stable,  Lab Results  Component Value Date   CREATININE 0.76 03/23/2023   CREATININE 1.03 12/27/2022   CREATININE 1.15 11/30/2022    Recent Labs  Lab 03/23/23 1623 03/23/23 1930  AST 129* 171*  ALT 35 49*  ALKPHOS 23* 33*  BILITOT 1.4* 1.5*  PROT 3.9* 5.8*  ALBUMIN 2.0* 2.7*   Lab Results  Component Value Date   CALCIUM 5.1 (LL) 03/23/2023    Plt: Lab Results  Component Value Date   PLT 346 03/23/2023       Recent Labs  Lab 03/23/23 1623  WBC 13.8*  NEUTROABS 11.1*  HGB 12.8*  HCT 38.8*  MCV 97.2  PLT 346    HG/HCT  stable,       Component Value Date/Time   HGB 12.8 (L) 03/23/2023 1623   HGB 15.5 11/30/2022 1505   HCT 38.8 (L) 03/23/2023 1623   HCT 46.2 11/30/2022 1505   MCV 97.2 03/23/2023 1623   MCV 95 11/30/2022 1505  ___   VBG  7.37 Acid-base deficit 2.0 mmol/L   pCO2, Ven 40 Low  mmHg O2 Saturation 53 %  pO2, Ven 32 mmHg    _________    Hospitalist was called for admission for rhabdomyolysis  The following Work up has been ordered so far:  Orders Placed This Encounter  Procedures   CT Head Wo Contrast   CT Cervical Spine Wo Contrast   DG Chest Port 1 View   DG Knee Complete 4 Views Left   CT Knee Left Wo Contrast   Comprehensive metabolic panel   CBC with Differential   Urinalysis, w/ Reflex to Culture (Infection Suspected) -Urine, Clean Catch   CK   Magnesium   Consult to hospitalist   EKG 12-Lead     OTHER Significant initial  Findings:  labs showing:     DM  labs:  HbA1C: No results for input(s): "HGBA1C" in the last 8760 hours.     CBG (last 3)  No results for input(s): "GLUCAP" in the last 72 hours.        Cultures: No results found for: "SDES", "SPECREQUEST", "CULT", "REPTSTATUS"   Radiological Exams on Admission: DG Abd 1 View  Result Date: 03/23/2023 CLINICAL DATA:  Abdominal pain and  constipation EXAM: ABDOMEN - 1 VIEW COMPARISON:  None Available. FINDINGS: Relative paucity of bowel gas is noted. No obstructive changes are seen. No free air is noted. Degenerative changes of lumbar spine are seen. No significant retained fecal material is noted. Right hip replacement is seen. IMPRESSION: No significant changes of constipation. Electronically Signed   By: Alcide Clever M.D.   On: 03/23/2023 20:07   CT Knee Left Wo Contrast  Result Date: 03/23/2023 CLINICAL DATA:  Weakness.  Knee trauma.  Fall EXAM: CT OF THE LEFT KNEE WITHOUT CONTRAST TECHNIQUE: Multidetector CT imaging of the left knee was performed according to the standard protocol. Multiplanar CT image reconstructions were also generated. RADIATION DOSE REDUCTION: This exam was performed according to the departmental dose-optimization program which includes automated exposure control, adjustment of the mA and/or kV according to patient size and/or use of iterative reconstruction technique. COMPARISON:  X-ray earlier 03/23/2023. FINDINGS: Bones/Joint/Cartilage No fracture identified. No dislocation. As seen on x-ray there are osteophytes seen of all 3 compartments, greatest of the lateral compartment. There is peaking of the tibial spines. Mild degenerative changes as well along the proximal tibiofibular joint some sclerosis and small osteophytes. Subchondral cyst formation identified along the posterior aspect of the medial tibial plateau towards the midline. Few other areas as well along the tibial plateau of both mediolateral compartments. Ligaments Suboptimally assessed by CT. Muscles and Tendons Areas of fatty muscle atrophy diffusely. Preserved patellar and quadriceps tendon. Soft tissues Chondrocalcinosis. Small joint effusion. Vascular calcifications are seen about the knee. Mild subcutaneous edema anteriorly. IMPRESSION: Tricompartmental degenerative changes. Chondrocalcinosis. Small joint effusion. Overall if there is further  concern of internal derangement dedicated MRI imaging can be considered if the patient is clinically compatible and when clinically appropriate Electronically Signed   By: Karen Kays M.D.   On: 03/23/2023 18:06   DG Chest Port 1 View  Result Date: 03/23/2023 CLINICAL DATA:  Weakness. EXAM: PORTABLE CHEST 1 VIEW COMPARISON:  Chest radiograph 12/27/2022. FINDINGS: Low lung volumes accentuate the pulmonary vasculature and cardiomediastinal silhouette. No consolidation or pulmonary edema. No pleural effusion or pneumothorax. Visualized bones and upper abdomen are unremarkable. IMPRESSION: No evidence of acute cardiopulmonary disease. Electronically Signed   By: Elwyn Reach.D.  On: 03/23/2023 16:53   DG Knee Complete 4 Views Left  Result Date: 03/23/2023 CLINICAL DATA:  Weakness. EXAM: LEFT KNEE - COMPLETE 4+ VIEW COMPARISON:  Left knee radiographs 09/16/2019. FINDINGS: Four views of the left knee. No acute fracture or dislocation. Possible small knee joint effusion. 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 Atherosclerotic vascular changes in the surrounding soft tissues. IMPRESSION: 1. No acute fracture or dislocation of the left knee. 2. 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 Electronically Signed   By: Orvan Falconer M.D.   On: 03/23/2023 16:44   CT Head Wo Contrast  Result Date: 03/23/2023 CLINICAL DATA:  Head trauma, minor (Age >= 65y); Neck trauma (Age >= 65y). Fall. EXAM: CT HEAD WITHOUT CONTRAST CT CERVICAL SPINE WITHOUT CONTRAST TECHNIQUE: Multidetector CT imaging of the head and cervical spine was performed following the standard protocol without intravenous contrast. Multiplanar CT image reconstructions of the cervical spine were also generated. RADIATION DOSE REDUCTION: This exam was performed according to the departmental dose-optimization program which includes automated exposure control, adjustment of the mA and/or kV  according to patient size and/or use of iterative reconstruction technique. COMPARISON:  Head CT 05/12/2022. FINDINGS: CT HEAD FINDINGS Brain: No acute intracranial hemorrhage. Gray-white differentiation is preserved. No hydrocephalus or extra-axial collection. No mass effect or midline shift. Vascular: No hyperdense vessel or unexpected calcification. Skull: No calvarial fracture or suspicious bone lesion. Skull base is unremarkable. Sinuses/Orbits: Unremarkable. Other: Retained metallic fragment superior to the left ear. CT CERVICAL SPINE FINDINGS Alignment: Trace degenerative retrolisthesis of C3 on C4. Skull base and vertebrae: No acute fracture. Normal craniocervical junction. No suspicious bone lesions. Soft tissues and spinal canal: No prevertebral fluid or swelling. No visible canal hematoma. Disc levels: Mild cervical spondylosis without high-grade spinal canal stenosis. Upper chest: Unremarkable. Other: None. IMPRESSION: 1. No acute intracranial abnormality. 2. No acute cervical spine fracture or traumatic malalignment. Electronically Signed   By: Orvan Falconer M.D.   On: 03/23/2023 16:34   CT Cervical Spine Wo Contrast  Result Date: 03/23/2023 CLINICAL DATA:  Head trauma, minor (Age >= 65y); Neck trauma (Age >= 65y). Fall. EXAM: CT HEAD WITHOUT CONTRAST CT CERVICAL SPINE WITHOUT CONTRAST TECHNIQUE: Multidetector CT imaging of the head and cervical spine was performed following the standard protocol without intravenous contrast. Multiplanar CT image reconstructions of the cervical spine were also generated. RADIATION DOSE REDUCTION: This exam was performed according to the departmental dose-optimization program which includes automated exposure control, adjustment of the mA and/or kV according to patient size and/or use of iterative reconstruction technique. COMPARISON:  Head CT 05/12/2022. FINDINGS: CT HEAD FINDINGS Brain: No acute intracranial hemorrhage. Gray-white differentiation is preserved. No  hydrocephalus or extra-axial collection. No mass effect or midline shift. Vascular: No hyperdense vessel or unexpected calcification. Skull: No calvarial fracture or suspicious bone lesion. Skull base is unremarkable. Sinuses/Orbits: Unremarkable. Other: Retained metallic fragment superior to the left ear. CT CERVICAL SPINE FINDINGS Alignment: Trace degenerative retrolisthesis of C3 on C4. Skull base and vertebrae: No acute fracture. Normal craniocervical junction. No suspicious bone lesions. Soft tissues and spinal canal: No prevertebral fluid or swelling. No visible canal hematoma. Disc levels: Mild cervical spondylosis without high-grade spinal canal stenosis. Upper chest: Unremarkable. Other: None. IMPRESSION: 1. No acute intracranial abnormality. 2. No acute cervical spine fracture or traumatic malalignment. Electronically Signed   By: Orvan Falconer M.D.   On: 03/23/2023 16:34   _______________________________________________________________________________________________________ Latest  Blood pressure 115/60, pulse 73, temperature 99 F (37.2 C), temperature  source Oral, resp. rate 14, height 6\' 2"  (1.88 m), weight 111 kg, SpO2 98 %.   Vitals  labs and radiology finding personally reviewed  Review of Systems:    Pertinent positives include:  fatigue, left knee pain   Constitutional:  No weight loss, night sweats, Fevers, chills,  weight loss  HEENT:  No headaches, Difficulty swallowing,Tooth/dental problems,Sore throat,  No sneezing, itching, ear ache, nasal congestion, post nasal drip,  Cardio-vascular:  No chest pain, Orthopnea, PND, anasarca, dizziness, palpitations.no Bilateral lower extremity swelling  GI:  No heartburn, indigestion, abdominal pain, nausea, vomiting, diarrhea, change in bowel habits, loss of appetite, melena, blood in stool, hematemesis Resp:  no shortness of breath at rest. No dyspnea on exertion, No excess mucus, no productive cough, No non-productive cough, No  coughing up of blood.No change in color of mucus.No wheezing. Skin:  no rash or lesions. No jaundice GU:  no dysuria, change in color of urine, no urgency or frequency. No straining to urinate.  No flank pain.  Musculoskeletal:  No joint pain or no joint swelling. No decreased range of motion. No back pain.  Psych:  No change in mood or affect. No depression or anxiety. No memory loss.  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 => TTE 02/02/2022: EF> 75%.  Moderate LVH.  GR 1 DD.  Normal RV and RVP.  Severe AS.  Mean gradient 44 mmHg up from 37 mmHg      Past Surgical History:  Procedure Laterality Date   CATARACT EXTRACTION, BILATERAL Bilateral    CHOLECYSTECTOMY     laparoscopic   EYE SURGERY Left    hole repair   JOINT REPLACEMENT Right    Knee   NM MYOVIEW LTD  11/2016    normal EF (55 to 65%).  No ischemia or infarction.  LOW RISK.   REPLACEMENT TOTAL KNEE     RIGHT/LEFT HEART CATH AND CORONARY ANGIOGRAPHY N/A 03/09/2022   Procedure: RIGHT/LEFT HEART CATH AND CORONARY ANGIOGRAPHY;  Surgeon: Marykay Lex, MD;  Location: [facility] INVASIVE CV LAB;  Service: Cardiovascular;  Laterality: N/A;   TONSILLECTOMY     TOTAL HIP ARTHROPLASTY Right 08/27/2013    Procedure: RIGHT TOTAL HIP ARTHROPLASTY ANTERIOR APPROACH;  Surgeon: [name], MD;  Location: [facility] ORS;  Service: Orthopedics;  Laterality: Right;   TRANSTHORACIC ECHOCARDIOGRAM  12/2017   a) 3/'19: Mild LVH.  Nl EF 55-60%.  GR 1 DD.  No RWMA.  Mild AS (MG 21 mm); Normal LV size, fxn w/o WMA.  Mod Conc VH.  EF 55-60%.  GR 1 DD.  NlRV size & fxn. Mod AS (MG  21 mmHg); b) 12/2019: Mild AS - MG 21 mmHg; c) 07/2021: EF 60-65%. No RWMA. Mod AS (MG 35 mmHg, PG 37 mmHg)   TRANSTHORACIC ECHOCARDIOGRAM  02/02/2022   EF> 75%.  Moderate LVH.  GR  1 DD.  Normal RV and RVP.  Severe aortic valve calcification with SEVERE AS.  MG 44 mmHg up from 37 mmHg; AVA by VTI 1.05 cm.    Social History:  Ambulatory  walker       reports that he has never smoked. He has never used smokeless tobacco. He reports current alcohol use of about 1.0 standard drink of alcohol per week. He reports that he does not use drugs.     Family History:   Family History  Problem Relation Age of Onset   Sudden death Sister 10   ______________________________________________________________________________________________ Allergies: Allergies  Allergen Reactions   Codeine Other (See Comments)    "Walking the walls"   Morphine And Codeine Other (See Comments)    "Walking the walls"   Sulfa Antibiotics Rash     Prior to Admission medications   Medication Sig Start Date End Date Taking? Authorizing Provider  albuterol (VENTOLIN HFA) 108 (90 Base) MCG/ACT inhaler INHALE 2 PUFFS BY MOUTH AS NEEDED 4 TIMES DAILY UNTIL WHEEZING EXTINGUISHED. RINSE AND SPIT AFTER USE. DEMONSTRATE PROPER TECHNIQUE INHALATI 08/18/21   Mannam, Praveen, MD  Ascorbic Acid (VITAMIN C) 1000 MG tablet Take 1,000 mg by mouth daily.    [provider]  aspirin EC 81 MG tablet Take 81 mg by mouth daily.    [provider]  Calcium Carb-Cholecalciferol 600-20 MG-MCG TABS Take 1 tablet by mouth daily.    [provider]   ipratropium-albuterol (DUONEB) 0.5-2.5 (3) MG/3ML SOLN Take 3 mLs by nebulization every 6 (six) hours as needed. 04/05/22   Chilton Greathouse, MD  levothyroxine (SYNTHROID) 25 MCG tablet Take 25 mcg by mouth daily before breakfast. 07/06/21   [provider]  losartan (COZAAR) 25 MG tablet Take 25 mg by mouth daily.    [provider]  magnesium hydroxide (MILK OF MAGNESIA) 400 MG/5ML suspension Take 30 mLs by mouth daily as needed for moderate constipation or heartburn.    [provider]  memantine (NAMENDA) 10 MG tablet Take 1 tablet (10 mg total) by mouth 2 (two) times daily. 04/13/22 05/08/23  Windell Norfolk, MD  omeprazole (PRILOSEC OTC) 20 MG tablet Take 20 mg by mouth daily.    [provider]  rosuvastatin (CRESTOR) 10 MG tablet Take 10 mg by mouth daily.    [provider]  vitamin B-12 (CYANOCOBALAMIN) 1000 MCG tablet Take 1,000 mcg by mouth daily.    [provider]    ___________________________________________________________________________________________________ Physical Exam:    03/23/2023    5:00 PM 03/23/2023    4:40 PM 03/23/2023    3:45 PM  Vitals with BMI  Systolic 115 124 161  Diastolic 60 54 58  Pulse 73 75 80     1. General:  in No  Acute distress   Chronically ill  -appearing 2. Psychological: Alert and   Oriented 3. Head/ENT:    Dry Mucous Membranes                          Head Non traumatic, neck supple                          Poor Dentition 4. SKIN:  decreased Skin turgor,  Skin clean Dry and intact no rash    5. Heart: Regular rate and rhythm no  Murmur, no Rub or gallop 6. Lungs:   no wheezes or crackles   7. Abdomen: Soft,  non-tender, Non distended bowel sounds present 8. Lower extremities: no clubbing, cyanosis, no  edema 9. Neurologically Grossly intact, moving all 4 extremities equally   10. MSK: painful  left knee but no redness or warmth      Chart has been  reviewed  ______________________________________________________________________________________________  Assessment/Plan 89 y.o. male with medical history significant of COPD, severe aortic stenosis, HTN, HLD, obesity, dementia, GERD, hypothyrodism    Admitted for dehydration and rhabdo   Present on Admission:  Rhabdomyolysis  Essential hypertension  Hyperlipidemia LDL goal <100  Dementia without behavioral disturbance (HCC)  Hypokalemia  Acute urinary retention  Elevated LFTs  AKI (acute kidney injury) (HCC)     Essential hypertension Allow permissive hypertension for tonight  Severe aortic stenosis by prior echocardiogram No evidence of syncope.  Patient states it was a mechanical fall.  No chest pains or shortness of breath appears to be well compensated continue to monitor  Rhabdomyolysis Rehydrate and check CK in a.m.  Hyperlipidemia LDL goal <100 Given elevated CK and LFTs bump will hold off on statin for tonight  Dementia without behavioral disturbance (HCC) Continue Namenda and monitor for any sign of sundowning  Hypokalemia Suspect erroneous will repeat if confirmed will replace  Acute urinary retention Place Foley and started on proscar given allergy to sulfa  Willl need Urology follow up   Elevated LFTs In the setting of rhabdomyolysis Rehydrate and repeat  AKI (acute kidney injury) (HCC) In the setting of dehydration and urinary retention obtain electrolytes in and out cath obtain 750 urine.  Place Foley strict I's and O's   Other plan as per orders.  DVT prophylaxis:  SCD      Code Status:    Code Status: Prior FULL CODE  as per patient   I had personally discussed CODE STATUS with patient   ACP none   Family Communication:   Family not at  Bedside    Diet hearth healthy   Disposition Plan:     likely will need placement for rehabilitation                             Following barriers for discharge:                             Electrolytes corrected                Consult Orders  (From admission, onward)           Start     Ordered   03/23/23 1824  Consult to hospitalist  Once       Provider:  (Not yet assigned)  Question Answer Comment  Place call to: [facility]   Reason for Consult Admit      03/23/23 1823                               Would benefit from PT/OT eval prior to DC  Ordered                    Consults called: none     Admission status:  ED Disposition     ED Disposition  Admit   Condition  --   Comment  Hospital Area: [facility] [[id number]]  Level of Care: Progressive [102]  Admit to Progressive based on following criteria:  MULTISYSTEM THREATS such as stable sepsis, metabolic/electrolyte imbalance with or without encephalopathy that is responding to early treatment.  May place patient in observation at [facility] or [facility] if equivalent level of care is available:: No  Covid Evaluation: Asymptomatic - no recent exposure (last 10 days) testing not required  Diagnosis: Rhabdomyolysis [728.88.ICD-9-CM]  Admitting Physician: Therisa Doyne [[id number]]  Attending Physician: Therisa Doyne [[id number]]          Obs    Level of care      progressive tele indefinitely please discontinue once patient no longer qualifies COVID-19 Labs   Anai Lipson 03/23/2023, 8:39 PM    [facility]     after 2 AM please page floor coverage PA If 7AM-7PM, please contact the day team taking care of the patient using [email or web address]

## 2023-03-23 NOTE — Assessment & Plan Note (Signed)
Rehydrate and check CK in a.m.

## 2023-03-23 NOTE — Assessment & Plan Note (Signed)
Suspect erroneous will repeat if confirmed will replace

## 2023-03-23 NOTE — Assessment & Plan Note (Signed)
In the setting of dehydration and urinary retention obtain electrolytes in and out cath obtain 750 urine.  Place Foley strict I's and O's

## 2023-03-24 ENCOUNTER — Inpatient Hospital Stay (HOSPITAL_COMMUNITY): Payer: Medicare Other

## 2023-03-24 DIAGNOSIS — F02A0 Dementia in other diseases classified elsewhere, mild, without behavioral disturbance, psychotic disturbance, mood disturbance, and anxiety: Secondary | ICD-10-CM | POA: Diagnosis present

## 2023-03-24 DIAGNOSIS — M6282 Rhabdomyolysis: Secondary | ICD-10-CM | POA: Diagnosis not present

## 2023-03-24 DIAGNOSIS — R062 Wheezing: Secondary | ICD-10-CM | POA: Diagnosis not present

## 2023-03-24 DIAGNOSIS — T796XXA Traumatic ischemia of muscle, initial encounter: Secondary | ICD-10-CM | POA: Diagnosis present

## 2023-03-24 DIAGNOSIS — G8929 Other chronic pain: Secondary | ICD-10-CM | POA: Diagnosis present

## 2023-03-24 DIAGNOSIS — Z6831 Body mass index (BMI) 31.0-31.9, adult: Secondary | ICD-10-CM | POA: Diagnosis not present

## 2023-03-24 DIAGNOSIS — I10 Essential (primary) hypertension: Secondary | ICD-10-CM | POA: Diagnosis present

## 2023-03-24 DIAGNOSIS — W19XXXA Unspecified fall, initial encounter: Secondary | ICD-10-CM | POA: Diagnosis not present

## 2023-03-24 DIAGNOSIS — M1712 Unilateral primary osteoarthritis, left knee: Secondary | ICD-10-CM | POA: Diagnosis present

## 2023-03-24 DIAGNOSIS — R531 Weakness: Secondary | ICD-10-CM | POA: Diagnosis not present

## 2023-03-24 DIAGNOSIS — E440 Moderate protein-calorie malnutrition: Secondary | ICD-10-CM | POA: Insufficient documentation

## 2023-03-24 DIAGNOSIS — D72829 Elevated white blood cell count, unspecified: Secondary | ICD-10-CM | POA: Diagnosis present

## 2023-03-24 DIAGNOSIS — I959 Hypotension, unspecified: Secondary | ICD-10-CM | POA: Diagnosis not present

## 2023-03-24 DIAGNOSIS — S51011A Laceration without foreign body of right elbow, initial encounter: Secondary | ICD-10-CM | POA: Diagnosis present

## 2023-03-24 DIAGNOSIS — N320 Bladder-neck obstruction: Secondary | ICD-10-CM | POA: Diagnosis not present

## 2023-03-24 DIAGNOSIS — Z7401 Bed confinement status: Secondary | ICD-10-CM | POA: Diagnosis not present

## 2023-03-24 DIAGNOSIS — E6609 Other obesity due to excess calories: Secondary | ICD-10-CM | POA: Diagnosis not present

## 2023-03-24 DIAGNOSIS — N401 Enlarged prostate with lower urinary tract symptoms: Secondary | ICD-10-CM | POA: Diagnosis not present

## 2023-03-24 DIAGNOSIS — C4492 Squamous cell carcinoma of skin, unspecified: Secondary | ICD-10-CM | POA: Diagnosis present

## 2023-03-24 DIAGNOSIS — R410 Disorientation, unspecified: Secondary | ICD-10-CM | POA: Diagnosis not present

## 2023-03-24 DIAGNOSIS — K219 Gastro-esophageal reflux disease without esophagitis: Secondary | ICD-10-CM | POA: Diagnosis not present

## 2023-03-24 DIAGNOSIS — R338 Other retention of urine: Secondary | ICD-10-CM | POA: Diagnosis not present

## 2023-03-24 DIAGNOSIS — E872 Acidosis, unspecified: Secondary | ICD-10-CM | POA: Diagnosis present

## 2023-03-24 DIAGNOSIS — J8489 Other specified interstitial pulmonary diseases: Secondary | ICD-10-CM | POA: Diagnosis present

## 2023-03-24 DIAGNOSIS — H9193 Unspecified hearing loss, bilateral: Secondary | ICD-10-CM | POA: Diagnosis not present

## 2023-03-24 DIAGNOSIS — X30XXXA Exposure to excessive natural heat, initial encounter: Secondary | ICD-10-CM | POA: Diagnosis not present

## 2023-03-24 DIAGNOSIS — W19XXXD Unspecified fall, subsequent encounter: Secondary | ICD-10-CM | POA: Diagnosis not present

## 2023-03-24 DIAGNOSIS — Z79890 Hormone replacement therapy (postmenopausal): Secondary | ICD-10-CM | POA: Diagnosis not present

## 2023-03-24 DIAGNOSIS — E559 Vitamin D deficiency, unspecified: Secondary | ICD-10-CM | POA: Diagnosis not present

## 2023-03-24 DIAGNOSIS — Y92009 Unspecified place in unspecified non-institutional (private) residence as the place of occurrence of the external cause: Secondary | ICD-10-CM | POA: Diagnosis not present

## 2023-03-24 DIAGNOSIS — M25562 Pain in left knee: Secondary | ICD-10-CM | POA: Diagnosis not present

## 2023-03-24 DIAGNOSIS — M23222 Derangement of posterior horn of medial meniscus due to old tear or injury, left knee: Secondary | ICD-10-CM | POA: Diagnosis present

## 2023-03-24 DIAGNOSIS — I350 Nonrheumatic aortic (valve) stenosis: Secondary | ICD-10-CM | POA: Diagnosis present

## 2023-03-24 DIAGNOSIS — E039 Hypothyroidism, unspecified: Secondary | ICD-10-CM | POA: Diagnosis not present

## 2023-03-24 DIAGNOSIS — M11262 Other chondrocalcinosis, left knee: Secondary | ICD-10-CM | POA: Diagnosis present

## 2023-03-24 DIAGNOSIS — F0390 Unspecified dementia without behavioral disturbance: Secondary | ICD-10-CM | POA: Diagnosis not present

## 2023-03-24 DIAGNOSIS — J4489 Other specified chronic obstructive pulmonary disease: Secondary | ICD-10-CM | POA: Diagnosis present

## 2023-03-24 DIAGNOSIS — E860 Dehydration: Secondary | ICD-10-CM | POA: Diagnosis present

## 2023-03-24 DIAGNOSIS — E669 Obesity, unspecified: Secondary | ICD-10-CM | POA: Diagnosis present

## 2023-03-24 DIAGNOSIS — S51011D Laceration without foreign body of right elbow, subsequent encounter: Secondary | ICD-10-CM | POA: Diagnosis not present

## 2023-03-24 DIAGNOSIS — E538 Deficiency of other specified B group vitamins: Secondary | ICD-10-CM | POA: Diagnosis not present

## 2023-03-24 DIAGNOSIS — W07XXXA Fall from chair, initial encounter: Secondary | ICD-10-CM | POA: Diagnosis present

## 2023-03-24 DIAGNOSIS — Z7982 Long term (current) use of aspirin: Secondary | ICD-10-CM | POA: Diagnosis not present

## 2023-03-24 DIAGNOSIS — Z466 Encounter for fitting and adjustment of urinary device: Secondary | ICD-10-CM | POA: Diagnosis not present

## 2023-03-24 DIAGNOSIS — N179 Acute kidney failure, unspecified: Secondary | ICD-10-CM | POA: Diagnosis present

## 2023-03-24 DIAGNOSIS — M25462 Effusion, left knee: Secondary | ICD-10-CM | POA: Diagnosis not present

## 2023-03-24 DIAGNOSIS — H9110 Presbycusis, unspecified ear: Secondary | ICD-10-CM | POA: Diagnosis present

## 2023-03-24 DIAGNOSIS — E785 Hyperlipidemia, unspecified: Secondary | ICD-10-CM | POA: Diagnosis present

## 2023-03-24 DIAGNOSIS — J4520 Mild intermittent asthma, uncomplicated: Secondary | ICD-10-CM | POA: Diagnosis present

## 2023-03-24 LAB — COMPREHENSIVE METABOLIC PANEL
ALT: 43 U/L (ref 0–44)
AST: 135 U/L — ABNORMAL HIGH (ref 15–41)
Albumin: 2.6 g/dL — ABNORMAL LOW (ref 3.5–5.0)
Alkaline Phosphatase: 27 U/L — ABNORMAL LOW (ref 38–126)
Anion gap: 9 (ref 5–15)
BUN: 23 mg/dL (ref 8–23)
CO2: 19 mmol/L — ABNORMAL LOW (ref 22–32)
Calcium: 8 mg/dL — ABNORMAL LOW (ref 8.9–10.3)
Chloride: 109 mmol/L (ref 98–111)
Creatinine, Ser: 0.98 mg/dL (ref 0.61–1.24)
GFR, Estimated: >60 mL/min (ref >60)
Glucose, Bld: 96 mg/dL (ref 70–99)
Potassium: 3.4 mmol/L — ABNORMAL LOW (ref 3.5–5.1)
Sodium: 137 mmol/L (ref 135–145)
Total Bilirubin: 1.1 mg/dL (ref 0.3–1.2)
Total Protein: 5.3 g/dL — ABNORMAL LOW (ref 6.5–8.1)

## 2023-03-24 LAB — CBC
HCT: 33.4 % — ABNORMAL LOW (ref 39.0–52.0)
Hemoglobin: 11 g/dL — ABNORMAL LOW (ref 13.0–17.0)
MCH: 32.4 pg (ref 26.0–34.0)
MCHC: 32.9 g/dL (ref 30.0–36.0)
MCV: 98.2 fL (ref 80.0–100.0)
Platelets: 283 10*3/uL (ref 150–400)
RBC: 3.4 MIL/uL — ABNORMAL LOW (ref 4.22–5.81)
RDW: 14.2 % (ref 11.5–15.5)
WBC: 9.4 10*3/uL (ref 4.0–10.5)
nRBC: 0 % (ref 0.0–0.2)

## 2023-03-24 LAB — CK: Total CK: 2604 U/L — ABNORMAL HIGH (ref 49–397)

## 2023-03-24 LAB — OSMOLALITY, URINE: Osmolality, Ur: 898 mOsm/kg (ref 300–900)

## 2023-03-24 LAB — PHOSPHORUS: Phosphorus: 3 mg/dL (ref 2.5–4.6)

## 2023-03-24 LAB — MAGNESIUM: Magnesium: 2.2 mg/dL (ref 1.7–2.4)

## 2023-03-24 MED ORDER — SODIUM CHLORIDE 0.9 % IV SOLN: INTRAVENOUS | Status: AC

## 2023-03-24 MED ORDER — MUSCLE RUB 10-15 % EX CREA
TOPICAL_CREAM | CUTANEOUS | Status: DC | PRN
  Administered 2023-03-27 – 2023-03-28 (×2): 1 via TOPICAL
  Filled 2023-03-24: qty 85

## 2023-03-24 MED ORDER — ORAL CARE MOUTH RINSE: 15.0000 mL | OROMUCOSAL | Status: DC | PRN

## 2023-03-24 MED ORDER — CALCIUM GLUCONATE-NACL 2-0.675 GM/100ML-% IV SOLN
2.0000 g | Freq: Once | INTRAVENOUS | Status: AC
  Administered 2023-03-24: 2000 mg via INTRAVENOUS
  Filled 2023-03-24: qty 100

## 2023-03-24 MED ORDER — ENSURE MAX PROTEIN PO LIQD
11.0000 [oz_av] | Freq: Every day | ORAL | Status: DC
  Administered 2023-03-24 – 2023-03-28 (×5): 11 [oz_av] via ORAL
  Filled 2023-03-24 (×4): qty 330

## 2023-03-24 NOTE — Plan of Care (Signed)
°  Problem: Education: °Goal: Knowledge of General Education information will improve °Description: Including pain rating scale, medication(s)/side effects and non-pharmacologic comfort measures °Outcome: Progressing °  °Problem: Coping: °Goal: Level of anxiety will decrease °Outcome: Progressing °  °Problem: Elimination: °Goal: Will not experience complications related to bowel motility °Outcome: Progressing °  °Problem: Pain Managment: °Goal: General experience of comfort will improve °Outcome: Progressing °  °Problem: Safety: °Goal: Ability to remain free from injury will improve °Outcome: Progressing °  °Problem: Skin Integrity: °Goal: Risk for impaired skin integrity will decrease °Outcome: Progressing °  °

## 2023-03-24 NOTE — Consult Note (Signed)
ORTHOPAEDIC CONSULTATION  REQUESTING PHYSICIAN: Rhetta Mura, MD  PCP:  Merri Brunette, MD  Chief Complaint: Left knee pain.   HPI: Raymond Cuevas is a 89 y.o. male who had a fall at home and was on the floor for 5 hours in a 95 degree house. He was admitted to [facility] for AKI and Rhabdo after the fall. He states that his left knee has been bothering him for a while, and it gave out causing him to fall. X-ray and CT scan of the left knee negative for acute injury but did note tricompartmental osteoarthritis and small joint effusion. Orthopedics was consulted for left knee pain. He denies any tingling or numbness in LE bilaterally. He is on aspirin 81mg  daily at baseline.   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 => TTE 02/02/2022: EF> 75%.  Moderate LVH.  GR 1 DD.  Normal RV and RVP.  Severe AS.  Mean gradient 44 mmHg up from 37 mmHg   Past Surgical History:  Procedure Laterality Date   CATARACT EXTRACTION, BILATERAL Bilateral    CHOLECYSTECTOMY     laparoscopic   EYE SURGERY Left    hole repair   JOINT REPLACEMENT Right    Knee   NM MYOVIEW LTD  11/2016    normal EF (55 to 65%).  No ischemia or infarction.  LOW RISK.   REPLACEMENT TOTAL KNEE     RIGHT/LEFT HEART CATH AND CORONARY ANGIOGRAPHY N/A 03/09/2022   Procedure: RIGHT/LEFT HEART CATH AND CORONARY ANGIOGRAPHY;  Surgeon: Marykay Lex, MD;  Location: [facility] INVASIVE CV LAB;  Service: Cardiovascular;  Laterality: N/A;   TONSILLECTOMY     TOTAL HIP ARTHROPLASTY Right 08/27/2013   Procedure: RIGHT TOTAL HIP  ARTHROPLASTY ANTERIOR APPROACH;  Surgeon: [name], MD;  Location: [facility] ORS;  Service: Orthopedics;  Laterality: Right;   TRANSTHORACIC ECHOCARDIOGRAM  12/2017   a) 3/'19: Mild LVH.  Nl EF 55-60%.  GR 1 DD.  No RWMA.  Mild AS (MG 21 mm); Normal LV size, fxn w/o WMA.  Mod Conc VH.  EF 55-60%.  GR 1 DD.  NlRV size & fxn. Mod AS (MG  21 mmHg); b) 12/2019: Mild AS - MG 21 mmHg; c) 07/2021: EF 60-65%. No RWMA. Mod AS (MG 35 mmHg, PG 37 mmHg)   TRANSTHORACIC ECHOCARDIOGRAM  02/02/2022   EF> 75%.  Moderate LVH.  GR 1 DD.  Normal RV and RVP.  Severe aortic valve calcification with SEVERE AS.  MG 44 mmHg up from 37 mmHg; AVA by VTI 1.05 cm.   Social History   Socioeconomic History   Marital status: Widowed    Spouse name: Not on file   Number of children: Not on file   Years of education: Not on file   Highest education level: Not on file  Occupational History   Not on file  Tobacco Use   Smoking status: Never   Smokeless tobacco: Never  Vaping Use   Vaping Use: Never used  Substance and Sexual Activity   Alcohol use: Yes    Alcohol/week: 1.0  standard drink of alcohol    Types: 1 Cans of beer per week    Comment: rarely   Drug use: No   Sexual activity: Never    Birth control/protection: None  Other Topics Concern   Not on file  Social History Narrative   Morning coffee (when up)   Social Determinants of Health   Financial Resource Strain: Not on file  Food Insecurity: No Food Insecurity (03/23/2023)   Hunger Vital Sign    Worried About Running Out of Food in the Last Year: Never true    Ran Out of Food in the Last Year: Never true  Transportation Needs: No Transportation Needs (03/23/2023)   PRAPARE - Administrator, Civil Service (Medical): No    Lack of Transportation (Non-Medical): No  Physical Activity: Not on file  Stress: Not on file  Social Connections: Not on file   Family History  Problem Relation Age of Onset   Sudden death Sister 17   Allergies   Allergen Reactions   Codeine Other (See Comments)    "Walking the walls"   Morphine And Codeine Other (See Comments)    "Walking the walls"   Sulfa Antibiotics Rash   Prior to Admission medications   Medication Sig Start Date End Date Taking? Authorizing Provider  acetaminophen (TYLENOL) 500 MG tablet Take 500-1,000 mg by mouth every 6 (six) hours as needed for mild pain or headache.   Yes [provider]  albuterol (VENTOLIN HFA) 108 (90 Base) MCG/ACT inhaler INHALE 2 PUFFS BY MOUTH AS NEEDED 4 TIMES DAILY UNTIL WHEEZING EXTINGUISHED. RINSE AND SPIT AFTER USE. DEMONSTRATE PROPER TECHNIQUE INHALATI Patient taking differently: Inhale 2 puffs into the lungs 4 (four) times daily as needed ("until wheezing is extinguished- rinse and spit after each use"). 08/18/21  Yes Mannam, Praveen, MD  Ascorbic Acid (VITAMIN C) 1000 MG tablet Take 1,000 mg by mouth daily.    [provider]  aspirin EC 81 MG tablet Take 81 mg by mouth daily.    [provider]  Calcium Carb-Cholecalciferol 600-20 MG-MCG TABS Take 1 tablet by mouth daily.    [provider]  ipratropium-albuterol (DUONEB) 0.5-2.5 (3) MG/3ML SOLN Take 3 mLs by nebulization every 6 (six) hours as needed. 04/05/22   Chilton Greathouse, MD  levothyroxine (SYNTHROID) 25 MCG tablet Take 25 mcg by mouth daily before breakfast. 07/06/21   [provider]  losartan (COZAAR) 25 MG tablet Take 25 mg by mouth daily.    [provider]  magnesium hydroxide (MILK OF MAGNESIA) 400 MG/5ML suspension Take 30 mLs by mouth daily as needed for moderate constipation or heartburn.    [provider]  memantine (NAMENDA) 10 MG tablet Take 1 tablet (10 mg total) by mouth 2 (two) times daily. 04/13/22 05/08/23  Windell Norfolk, MD  omeprazole (PRILOSEC OTC) 20 MG tablet Take 20 mg by mouth daily.    [provider]  rosuvastatin (CRESTOR) 10 MG tablet Take 10 mg by mouth daily.    [provider]   SYMBICORT 160-4.5 MCG/ACT inhaler Inhale 2 puffs into the lungs 2 (two) times daily. Patient not taking: Reported on 03/24/2023    [provider]  vitamin B-12 (CYANOCOBALAMIN) 1000 MCG tablet Take 1,000 mcg by mouth daily.    [provider]   DG Abd 1 View  Result Date: 03/23/2023 CLINICAL DATA:  Abdominal pain and constipation EXAM: ABDOMEN - 1 VIEW COMPARISON:  None Available. FINDINGS: Relative paucity of bowel gas  is noted. No obstructive changes are seen. No free air is noted. Degenerative changes of lumbar spine are seen. No significant retained fecal material is noted. Right hip replacement is seen. IMPRESSION: No significant changes of constipation. Electronically Signed   By: Alcide Clever M.D.   On: 03/23/2023 20:07   CT Knee Left Wo Contrast  Result Date: 03/23/2023 CLINICAL DATA:  Weakness.  Knee trauma.  Fall EXAM: CT OF THE LEFT KNEE WITHOUT CONTRAST TECHNIQUE: Multidetector CT imaging of the left knee was performed according to the standard protocol. Multiplanar CT image reconstructions were also generated. RADIATION DOSE REDUCTION: This exam was performed according to the departmental dose-optimization program which includes automated exposure control, adjustment of the mA and/or kV according to patient size and/or use of iterative reconstruction technique. COMPARISON:  X-ray earlier 03/23/2023. FINDINGS: Bones/Joint/Cartilage No fracture identified. No dislocation. As seen on x-ray there are osteophytes seen of all 3 compartments, greatest of the lateral compartment. There is peaking of the tibial spines. Mild degenerative changes as well along the proximal tibiofibular joint some sclerosis and small osteophytes. Subchondral cyst formation identified along the posterior aspect of the medial tibial plateau towards the midline. Few other areas as well along the tibial plateau of both mediolateral compartments. Ligaments Suboptimally assessed by CT. Muscles and Tendons Areas  of fatty muscle atrophy diffusely. Preserved patellar and quadriceps tendon. Soft tissues Chondrocalcinosis. Small joint effusion. Vascular calcifications are seen about the knee. Mild subcutaneous edema anteriorly. IMPRESSION: Tricompartmental degenerative changes. Chondrocalcinosis. Small joint effusion. Overall if there is further concern of internal derangement dedicated MRI imaging can be considered if the patient is clinically compatible and when clinically appropriate Electronically Signed   By: Karen Kays M.D.   On: 03/23/2023 18:06   DG Chest Port 1 View  Result Date: 03/23/2023 CLINICAL DATA:  Weakness. EXAM: PORTABLE CHEST 1 VIEW COMPARISON:  Chest radiograph 12/27/2022. FINDINGS: Low lung volumes accentuate the pulmonary vasculature and cardiomediastinal silhouette. No consolidation or pulmonary edema. No pleural effusion or pneumothorax. Visualized bones and upper abdomen are unremarkable. IMPRESSION: No evidence of acute cardiopulmonary disease. Electronically Signed   By: Orvan Falconer M.D.   On: 03/23/2023 16:53   DG Knee Complete 4 Views Left  Result Date: 03/23/2023 CLINICAL DATA:  Weakness. EXAM: LEFT KNEE - COMPLETE 4+ VIEW COMPARISON:  Left knee radiographs 09/16/2019. FINDINGS: Four views of the left knee. No acute fracture or dislocation. Possible small knee joint effusion. 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 Atherosclerotic vascular changes in the surrounding soft tissues. IMPRESSION: 1. No acute fracture or dislocation of the left knee. 2. 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 Electronically Signed   By: Orvan Falconer M.D.   On: 03/23/2023 16:44   CT Head Wo Contrast  Result Date: 03/23/2023 CLINICAL DATA:  Head trauma, minor (Age >= 65y); Neck trauma (Age >= 65y). Fall. EXAM: CT HEAD WITHOUT CONTRAST CT CERVICAL SPINE WITHOUT CONTRAST TECHNIQUE: Multidetector CT imaging of the head and cervical spine  was performed following the standard protocol without intravenous contrast. Multiplanar CT image reconstructions of the cervical spine were also generated. RADIATION DOSE REDUCTION: This exam was performed according to the departmental dose-optimization program which includes automated exposure control, adjustment of the mA and/or kV according to patient size and/or use of iterative reconstruction technique. COMPARISON:  Head CT 05/12/2022. FINDINGS: CT HEAD FINDINGS Brain: No acute intracranial hemorrhage. Gray-white differentiation is preserved. No hydrocephalus or extra-axial collection. No mass effect or midline shift. Vascular: No hyperdense vessel  or unexpected calcification. Skull: No calvarial fracture or suspicious bone lesion. Skull base is unremarkable. Sinuses/Orbits: Unremarkable. Other: Retained metallic fragment superior to the left ear. CT CERVICAL SPINE FINDINGS Alignment: Trace degenerative retrolisthesis of C3 on C4. Skull base and vertebrae: No acute fracture. Normal craniocervical junction. No suspicious bone lesions. Soft tissues and spinal canal: No prevertebral fluid or swelling. No visible canal hematoma. Disc levels: Mild cervical spondylosis without high-grade spinal canal stenosis. Upper chest: Unremarkable. Other: None. IMPRESSION: 1. No acute intracranial abnormality. 2. No acute cervical spine fracture or traumatic malalignment. Electronically Signed   By: Orvan Falconer M.D.   On: 03/23/2023 16:34   CT Cervical Spine Wo Contrast  Result Date: 03/23/2023 CLINICAL DATA:  Head trauma, minor (Age >= 65y); Neck trauma (Age >= 65y). Fall. EXAM: CT HEAD WITHOUT CONTRAST CT CERVICAL SPINE WITHOUT CONTRAST TECHNIQUE: Multidetector CT imaging of the head and cervical spine was performed following the standard protocol without intravenous contrast. Multiplanar CT image reconstructions of the cervical spine were also generated. RADIATION DOSE REDUCTION: This exam was performed according to  the departmental dose-optimization program which includes automated exposure control, adjustment of the mA and/or kV according to patient size and/or use of iterative reconstruction technique. COMPARISON:  Head CT 05/12/2022. FINDINGS: CT HEAD FINDINGS Brain: No acute intracranial hemorrhage. Gray-white differentiation is preserved. No hydrocephalus or extra-axial collection. No mass effect or midline shift. Vascular: No hyperdense vessel or unexpected calcification. Skull: No calvarial fracture or suspicious bone lesion. Skull base is unremarkable. Sinuses/Orbits: Unremarkable. Other: Retained metallic fragment superior to the left ear. CT CERVICAL SPINE FINDINGS Alignment: Trace degenerative retrolisthesis of C3 on C4. Skull base and vertebrae: No acute fracture. Normal craniocervical junction. No suspicious bone lesions. Soft tissues and spinal canal: No prevertebral fluid or swelling. No visible canal hematoma. Disc levels: Mild cervical spondylosis without high-grade spinal canal stenosis. Upper chest: Unremarkable. Other: None. IMPRESSION: 1. No acute intracranial abnormality. 2. No acute cervical spine fracture or traumatic malalignment. Electronically Signed   By: Orvan Falconer M.D.   On: 03/23/2023 16:34    Positive ROS: All other systems have been reviewed and were otherwise negative with the exception of those mentioned in the HPI and as above.  Physical Exam: General: Alert and oriented x3. No acute distress Cardiovascular: No pedal edema Respiratory: No cyanosis, no use of accessory musculature GI: No organomegaly, abdomen is soft and non-tender Skin: No lesions in the area of chief complaint Neurologic: Sensation intact distally Psychiatric: Patient is competent for consent with normal mood and affect Lymphatic: No axillary or cervical lymphadenopathy  MUSCULOSKELETAL: Examination of the left knee reveals no skin wounds or lesions. No warmth or erythema noted. Mild swelling, mild  effusion noted. Pain with palpation over the medial joint line, periretinacular tissues, superior aspect of the patella. Range of motion limited due to pain. No ligamentous instability noted, but exam limited due to pain. Extensor lag noted. Painless hip range or motion.   Sensory and motor function intact in LE bilaterally. Distal pedal pulses 2+ bilaterally. Compartments soft. Capillary refill < 2 seconds.   Assessment: Fall Chronic left knee pain Left knee effusion.  Extensor mechanism lag.   Plan: I discussed the findings with the patient. X-rays and CT scan negative for acute injury with fracture or dislocation, he does have tricompartmental osteoarthritis and a small joint effusion. Extensor lag noted on exam. MRI ordered to rule out extensor mechanism rupture. Will follow-up once MRI completed. All questions solicited and answered.  Clois Dupes, PA-C    03/24/2023 4:46 PM

## 2023-03-24 NOTE — Evaluation (Signed)
Physical Therapy Evaluation Patient Details Name: Raymond Cuevas MRN: [id number] DOB: 09-08-1934 Today's Date: 03/24/2023  History of Present Illness  89 y.o. male with medical history significant of COPD, severe aortic stenosis, HTN, HLD, obesity, dementia, GERD, hypothyrodism. Presented with  fall, fatigue  Pt was found down in his home lives alone. Dx of rhabdomyolysis, AKI, dehydration, urinary retention.  Clinical Impression  Pt admitted with above diagnosis. Min assist for sit to stand, mod assist sit to stand. Pt was able to take a few pivotal steps from bed to recliner with RW, but stated he felt he'd fall if he attempted to walk further.  Pt currently with functional limitations due to the deficits listed below (see PT Problem List). Pt will benefit from acute skilled PT to increase their independence and safety with mobility to allow discharge.          Recommendations for follow up therapy are one component of a multi-disciplinary discharge planning process, led by the attending physician.  Recommendations may be updated based on patient status, additional functional criteria and insurance authorization.  Follow Up Recommendations       Assistance Recommended at Discharge Intermittent Supervision/Assistance  Patient can return home with the following  Assistance with cooking/housework;Assist for transportation;Help with stairs or ramp for entrance;A lot of help with walking and/or transfers;A lot of help with bathing/dressing/bathroom    Equipment Recommendations None recommended by PT  Recommendations for Other Services       Functional Status Assessment Patient has had a recent decline in their functional status and demonstrates the ability to make significant improvements in function in a reasonable and predictable amount of time.     Precautions / Restrictions Precautions Precautions: Fall Precaution Comments: fell just prior to admission, pt denies other falls in past  6 months Restrictions Weight Bearing Restrictions: No      Mobility  Bed Mobility Overal bed mobility: Needs Assistance Bed Mobility: Supine to Sit     Supine to sit: Min assist     General bed mobility comments: assist to raise trunk    Transfers Overall transfer level: Needs assistance Equipment used: Rolling walker (2 wheels) Transfers: Sit to/from Stand Sit to Stand: Mod assist, From elevated surface           General transfer comment: assist to power up    Ambulation/Gait Ambulation/Gait assistance: Min guard Gait Distance (Feet): 4 Feet Assistive device: Rolling walker (2 wheels) Gait Pattern/deviations: Step-to pattern, Decreased step length - right, Decreased step length - left Gait velocity: decr     General Gait Details: pt took several steps from bed to recliner, pt stated he felt if he walked farther he'd fall  Stairs            Wheelchair Mobility    Modified Rankin (Stroke Patients Only)       Balance Overall balance assessment: Needs assistance, History of Falls Sitting-balance support: Feet supported, No upper extremity supported Sitting balance-Leahy Scale: Good     Standing balance support: Bilateral upper extremity supported, During functional activity, Reliant on assistive device for balance Standing balance-Leahy Scale: Poor                               Pertinent Vitals/Pain Pain Assessment Pain Assessment: Faces Faces Pain Scale: Hurts little more Pain Location: L knee with activity Pain Descriptors / Indicators: Grimacing Pain Intervention(s): Limited activity within patient's tolerance, Monitored during session, Repositioned  Home Living Family/patient expects to be discharged to:: Private residence Living Arrangements: Alone Available Help at Discharge: Family;Available PRN/intermittently   Home Access: Stairs to enter Entrance Stairs-Rails: Right;Left Entrance Stairs-Number of Steps: 3 + 1    Home Layout: One level Home Equipment: Agricultural consultant (2 wheels);Cane - single point      Prior Function Prior Level of Function : Independent/Modified Independent             Mobility Comments: uses cane or RW to walk ADLs Comments: stated he doesn't drive, daughter helps get groceries     Hand Dominance        Extremity/Trunk Assessment   Upper Extremity Assessment Upper Extremity Assessment: Defer to OT evaluation    Lower Extremity Assessment Lower Extremity Assessment: LLE deficits/detail LLE Deficits / Details: pt reports L knee pain with activity; knee ext +4/5 LLE Sensation: WNL    Cervical / Trunk Assessment Cervical / Trunk Assessment: Normal  Communication   Communication: HOH  Cognition Arousal/Alertness: Awake/alert Behavior During Therapy: WFL for tasks assessed/performed Overall Cognitive Status: Within Functional Limits for tasks assessed                                          General Comments      Exercises     Assessment/Plan    PT Assessment Patient needs continued PT services  PT Problem List Decreased activity tolerance;Decreased mobility       PT Treatment Interventions Therapeutic activities;Gait training;Therapeutic exercise;Functional mobility training;Patient/family education    PT Goals (Current goals can be found in the Care Plan section)  Acute Rehab PT Goals Patient Stated Goal: decrease L knee pain PT Goal Formulation: With patient Time For Goal Achievement: 04/07/23 Potential to Achieve Goals: Good    Frequency Min 1X/week     Co-evaluation               AM-PAC PT "6 Clicks" Mobility  Outcome Measure Help needed turning from your back to your side while in a flat bed without using bedrails?: A Little Help needed moving from lying on your back to sitting on the side of a flat bed without using bedrails?: A Little Help needed moving to and from a bed to a chair (including a wheelchair)?:  A Little Help needed standing up from a chair using your arms (e.g., wheelchair or bedside chair)?: A Little Help needed to walk in hospital room?: A Little Help needed climbing 3-5 steps with a railing? : A Lot 6 Click Score: 17    End of Session Equipment Utilized During Treatment: Gait belt Activity Tolerance: Patient tolerated treatment well Patient left: in chair;with chair alarm set;with call bell/phone within reach Nurse Communication: Mobility status PT Visit Diagnosis: History of falling (Z91.81);Other abnormalities of gait and mobility (R26.89);Difficulty in walking, not elsewhere classified (R26.2)    Time: 1105-1120 PT Time Calculation (min) (ACUTE ONLY): 15 min   Charges:   PT Evaluation $PT Eval Moderate Complexity: 1 Mod         Tamala Ser PT 03/24/2023  Acute Rehabilitation Services  Office [phone]

## 2023-03-24 NOTE — Evaluation (Signed)
Occupational Therapy Evaluation Patient Details Name: Raymond Cuevas MRN: [id number] DOB: 1934-04-11 Today's Date: 03/24/2023   History of Present Illness 89 y.o. male with medical history significant of COPD, severe aortic stenosis, HTN, HLD, obesity, dementia, GERD, hypothyrodism. Presented with  fall, fatigue  Pt was found down in his home lives alone. Dx of rhabdomyolysis, AKI, dehydration, urinary retention.   Clinical Impression   Patient is currently requiring assistance with ADLs including up to moderate assist with standing Lower body ADLs, setup assist with seated Upper body ADLs,  as well as moderate assist with functional transfers to toilet.  Current level of function is below patient's typical baseline.  During this evaluation, patient was limited by generalized weakness, impaired activity tolerance, and mild cognitive deficits, as well as chronic, severe LT knee pain all of which has the potential to impact patient's safety and independence during functional mobility, as well as performance for ADLs.  Patient lives alone, and has a daughter who is able to bring pt his groceries.  Patient demonstrates good rehab potential, and should benefit from continued skilled occupational therapy services while in acute care to maximize safety, independence and quality of life at home.  Continued occupational therapy services are recommended.  ?      Recommendations for follow up therapy are one component of a multi-disciplinary discharge planning process, led by the attending physician.  Recommendations may be updated based on patient status, additional functional criteria and insurance authorization.   Assistance Recommended at Discharge Frequent or constant Supervision/Assistance  Patient can return home with the following A lot of help with bathing/dressing/bathroom;A lot of help with walking and/or transfers;Help with stairs or ramp for entrance;Assistance with cooking/housework;Direct  supervision/assist for financial management;Direct supervision/assist for medications management    Functional Status Assessment  Patient has had a recent decline in their functional status and demonstrates the ability to make significant improvements in function in a reasonable and predictable amount of time.  Equipment Recommendations  BSC/3in1    Recommendations for Other Services       Precautions / Restrictions Precautions Precautions: Fall Precaution Comments: fell just prior to admission, pt denies other falls in past 6 months Restrictions Weight Bearing Restrictions: No      Mobility Bed Mobility               General bed mobility comments: Pt received up in recliner    Transfers                          Balance Overall balance assessment: Needs assistance, History of Falls Sitting-balance support: Feet supported, No upper extremity supported Sitting balance-Leahy Scale: Good     Standing balance support: Bilateral upper extremity supported, During functional activity, Reliant on assistive device for balance Standing balance-Leahy Scale: Poor                             ADL either performed or assessed with clinical judgement   ADL Overall ADL's : Needs assistance/impaired Eating/Feeding: Independent   Grooming: Wash/dry hands;Set up;Sitting Grooming Details (indicate cue type and reason): Pt reports that he cannot toelrate standing long enough to perform grooming standing at sink. Upper Body Bathing: Set up;Sitting   Lower Body Bathing: Moderate assistance;Sitting/lateral leans;Sit to/from stand   Upper Body Dressing : Supervision/safety;Set up;Sitting   Lower Body Dressing: Moderate assistance;Sit to/from stand Lower Body Dressing Details (indicate cue type and reason): Pt  able to demonstrate doffing and donning LT sock with increased time/effort and supervision. Toilet Transfer: Ambulation;Minimal assistance;Rolling walker (2  wheels);Cueing for sequencing Toilet Transfer Details (indicate cue type and reason): Pt educated on use of RW to compensate for LLE pain and OT demonstrated sequence. Pt then stood from recliner to RW with Mod As. Pt able to ambulate ~12' with cues for slow, controlled mvoements and for sequence, and pt reported improved pain with this. Pt did require Min As to control descent to recliner. Toileting- Clothing Manipulation and Hygiene: Minimal assistance;Sit to/from stand;Sitting/lateral lean       Functional mobility during ADLs: Minimal assistance;Moderate assistance;Rolling walker (2 wheels);Cueing for sequencing;Cueing for safety       Vision Baseline Vision/History: 1 Wears glasses Ability to See in Adequate Light: 0 Adequate Patient Visual Report: No change from baseline       Perception     Praxis      Pertinent Vitals/Pain Pain Assessment Pain Assessment: 0-10 Pain Score: 8  Pain Location: L knee with activity Pain Descriptors / Indicators: Grimacing Pain Intervention(s): Limited activity within patient's tolerance, Monitored during session, Repositioned, Patient requesting pain meds-RN notified (Used call bell to request pain meds)     Hand Dominance     Extremity/Trunk Assessment Upper Extremity Assessment Upper Extremity Assessment: Overall WFL for tasks assessed   Lower Extremity Assessment Lower Extremity Assessment: Defer to PT evaluation LLE Deficits / Details: pt reports L knee pain with activity; knee ext +4/5 LLE Sensation: WNL   Cervical / Trunk Assessment Cervical / Trunk Assessment: Normal   Communication Communication Communication: HOH   Cognition Arousal/Alertness: Awake/alert Behavior During Therapy: WFL for tasks assessed/performed Overall Cognitive Status: No family/caregiver present to determine baseline cognitive functioning Area of Impairment: Orientation, Memory, Awareness, Safety/judgement                 Orientation Level:  Situation (Self-corrected year after "2004", asked if it was June.)   Memory: Decreased short-term memory   Safety/Judgement: Decreased awareness of deficits Awareness: Emergent   General Comments: Pt struggling with reason for hospitalization. Pt re-oriented to situation. Pt asked if his house has AC. Pt reported that he does and "I keep it on 75 all the time."  Pt told that EMS reported home temp of 90 degrees. Pt reports, "I guess I didn't turn it on.".     General Comments       Exercises     Shoulder Instructions      Home Living Family/patient expects to be discharged to:: Private residence Living Arrangements: Alone Available Help at Discharge: Family;Available PRN/intermittently   Home Access: Stairs to enter Entrance Stairs-Number of Steps: 3 + 1 Entrance Stairs-Rails: Right;Left Home Layout: One level     Bathroom Shower/Tub: Producer, television/film/video: Standard     Home Equipment: Agricultural consultant (2 wheels);Cane - single point;Grab bars - tub/shower;Shower seat - built in          Prior Functioning/Environment Prior Level of Function : Independent/Modified Independent             Mobility Comments: uses cane or RW to walk ADLs Comments: stated he doesn't drive, daughter helps get groceries. Pt reports he performs all his ADLs but is limited with standing tolerance due to his LT knee.        OT Problem List: Pain;Impaired balance (sitting and/or standing);Decreased activity tolerance;Decreased knowledge of use of DME or AE;Decreased cognition      OT Treatment/Interventions: Self-care/ADL  training;Therapeutic activities;Cognitive remediation/compensation;Energy conservation;Patient/family education;Balance training;DME and/or AE instruction    OT Goals(Current goals can be found in the care plan section) Acute Rehab OT Goals Patient Stated Goal: For LT knee pain to resolve so pt can tolerate walking again. OT Goal Formulation: With  patient Time For Goal Achievement: 04/07/23 Potential to Achieve Goals: Fair ADL Goals Pt Will Perform Grooming: with modified independence;standing (Tolerating at least 1 standing grooming task, compensating with weight shift to RLE and reporting no mroe than 5/10 pain.) Pt Will Perform Lower Body Bathing: with supervision;sitting/lateral leans;sit to/from stand;with adaptive equipment Pt Will Perform Lower Body Dressing: with adaptive equipment;with modified independence;sitting/lateral leans;sit to/from stand Pt Will Transfer to Toilet: ambulating;with modified independence;bedside commode Pt Will Perform Toileting - Clothing Manipulation and hygiene: with modified independence;sitting/lateral leans;sit to/from stand Additional ADL Goal #1: Patient will identify at least 3 fall prevention strategies to employ at home in order to maximize function and safety during ADLs and decrease caregiver burden while preventing possible injury and rehospitalization.  OT Frequency: Min 1X/week    Co-evaluation              AM-PAC OT "6 Clicks" Daily Activity     Outcome Measure Help from another person eating meals?: None Help from another person taking care of personal grooming?: A Little Help from another person toileting, which includes using toliet, bedpan, or urinal?: A Lot Help from another person bathing (including washing, rinsing, drying)?: A Lot Help from another person to put on and taking off regular upper body clothing?: A Little Help from another person to put on and taking off regular lower body clothing?: A Lot 6 Click Score: 16   End of Session Equipment Utilized During Treatment: Gait belt;Rolling walker (2 wheels) Nurse Communication: Mobility status  Activity Tolerance: Patient limited by pain Patient left: in chair;with chair alarm set;with call bell/phone within reach  OT Visit Diagnosis: Unsteadiness on feet (R26.81);History of falling (Z91.81);Pain;Other symptoms and  signs involving cognitive function Pain - Right/Left: Left Pain - part of body: Knee                Time: 1152-1209 OT Time Calculation (min): 17 min Charges:  OT General Charges $OT Visit: 1 Visit OT Evaluation $OT Eval Low Complexity: 1 Low  Muadh Creasy, OT Acute Rehab Services Office: [phone] 03/24/2023  Theodoro Clock 03/24/2023, 1:22 PM

## 2023-03-24 NOTE — Progress Notes (Signed)
Initial Nutrition Assessment  DOCUMENTATION CODES:   Non-severe (moderate) malnutrition in context of chronic illness  INTERVENTION:   -Ensure MAX Protein po daily, each supplement provides 150 kcal and 30 grams of protein   NUTRITION DIAGNOSIS:   Moderate Malnutrition related to chronic illness (dementia) as evidenced by mild fat depletion, mild muscle depletion.  GOAL:   Patient will meet greater than or equal to 90% of their needs  MONITOR:   PO intake, Supplement acceptance, Labs, Weight trends, I & O's  REASON FOR ASSESSMENT:   Consult Assessment of nutrition requirement/status  ASSESSMENT:   89 y.o. male with medical history significant of COPD, severe aortic stenosis, HTN, HLD, obesity, dementia, GERD, hypothyrodism. Admitted following a mechanical fall.  Patient in room, reports he ate some scrambled eggs this morning. Feels like he doesn't eat as much as he used to. Mainly makes sandwiches, with ham, cheese or bologna. States he is 89 year old and he actually wants to lose weight. When asked if he was trying to lose weight, he reports "no". Pt with dementia, at times wouldn't answer my questions. Main concern is that that his knee bothers him which he states is why he fell.  When asked if he takes vitamins at home, he states he takes 6 daily but could not name them.  Will order Ensure Max as pt likely not eating consistently at home.   Per weight records, pt has lost 23 lbs since 06/03/22 (8% wt loss x 10 months, insignificant for time frame).  Medications: calcium gluconate  Labs reviewed: Low K   NUTRITION - FOCUSED PHYSICAL EXAM:  Flowsheet Row Most Recent Value  Orbital Region Mild depletion  Upper Arm Region Mild depletion  Thoracic and Lumbar Region No depletion  Buccal Region Moderate depletion  Temple Region Mild depletion  Clavicle Bone Region No depletion  Clavicle and Acromion Bone Region No depletion  Scapular Bone Region No depletion  Dorsal  Hand Mild depletion  Patellar Region No depletion  Anterior Thigh Region No depletion  Posterior Calf Region No depletion  Edema (RD Assessment) None  Hair Reviewed  Eyes Reviewed  Mouth Reviewed  [missing some teeth]  Skin Reviewed  Nails Reviewed  [thickened nails]       Diet Order:   Diet Order             Diet Heart Room service appropriate? Yes; Fluid consistency: Thin  Diet effective now                   EDUCATION NEEDS:   No education needs have been identified at this time  Skin:  Skin Assessment: Skin Integrity Issues: Skin Integrity Issues:: Other (Comment) Other: non-pressure wound of rt arm, MASD of buttocks  Last BM:  6/5  Height:   Ht Readings from Last 1 Encounters:  03/23/23 6\' 2"  (1.88 m)    Weight:   Wt Readings from Last 1 Encounters:  03/23/23 111 kg    BMI:  Body mass index is 31.42 kg/m.  Estimated Nutritional Needs:   Kcal:  1800-2000  Protein:  80-90g  Fluid:  2L/day  Tilda Franco, MS, RD, LDN Inpatient Clinical Dietitian Contact information available via Amion

## 2023-03-24 NOTE — Progress Notes (Signed)
PROGRESS NOTE   Raymond Cuevas  ZOX:[id number] DOB: 05-Oct-1934 DOA: 03/23/2023 PCP: Merri Brunette, MD  Brief Narrative:   89 year old community dwelling white male (lives alone)-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 bili 1.4, AST/ALT 129/35, magnesium 1.3 WBC 13.8  Admitted for AKI, Rhabdo and fall  Hospital-Problem based course  Accidental fall + rhabdo --Heat exhaustion contributing Has chronic left knee pain unclear if this contributed to his fall X-ray + CT of left knee concerning for arthritis with mild effusion-given no fracture suspected, Dr. Linna Caprice has agreed to see the patient for consideration of arthrocentesis/steroid injection-much appreciated  Severe AoS none TAVR candidate 2/2 dementia Not a candidate for TAVR--symptomatic management only Might need palliative care to see as an outpatient  Rhabdo + ensuant transaminitis  CK, transaminases is trending downward well and we will repeat labs in the morning Continue  NS 100 cc/H carefully for another 4 hours and then stop given severe AOS  AKI metabolic acidosis multiple electrolyte abnormalities--PTA losartan Hold ACE ARB and thiazide in the outpatient setting forever Magnesium calcium all replaced and we will reevaluate labs in a.m.  Mild intermittent asthma Stable-continue albuterol every 4 as needed  Elevated PSA squamous cell CA  Mild dementia See above discussions   DVT prophylaxis: SCD Code Status: Full Family Communication: Discussed with the daughter at the  bedside Disposition:  Status is: Observation The patient remains OBS appropriate and will d/c before 2 midnights.  Patient will need skilled placement   Subjective: Awake coherent no distress a little bit forgetful but able to orient to date time person place Main issue is left knee pain which is more swollen  Objective: Vitals:   03/23/23 2120 03/23/23 2205 03/24/23 0114 03/24/23 0456  BP: 113/60  125/63 120/60  Pulse: 60  63 60  Resp: 20  20 20   Temp: 97.9 F (36.6 C)  97.7 F (36.5 C) 97.7 F (36.5 C)  TempSrc: Oral  Oral Oral  SpO2: 99% 97% 99% 96%  Weight:      Height:        Intake/Output Summary (Last 24 hours) at 03/24/2023 0726 Last data filed at 03/24/2023 8119 Gross per 24 hour  Intake 1371.78 ml  Output 750 ml  Net 621.78 ml   Filed Weights   03/23/23 1543  Weight: 111 kg    Examination:  Awake coherent no distress no icterus no pallor Chest clear no added sounds Abdomen soft no rebound no guarding ROM intact upper extremities Painful swelling to left knee with ballottement as well as suprapatellar fullness which is mild compared to the right side-passive ROM slightly tender-medial joint line is tender  Data Reviewed: personally reviewed   CBC    Component Value Date/Time   WBC 9.4 03/24/2023 0530   RBC 3.40 (L) 03/24/2023 0530   HGB 11.0 (L) 03/24/2023 0530   HGB 15.5 11/30/2022 1505   HCT 33.4 (L) 03/24/2023 0530   HCT 46.2 11/30/2022 1505   PLT 283 03/24/2023 0530   PLT 375 11/30/2022 1505   MCV 98.2 03/24/2023 0530   MCV 95 11/30/2022 1505  MCH 32.4 03/24/2023 0530   MCHC 32.9 03/24/2023 0530   RDW 14.2 03/24/2023 0530   RDW 12.4 11/30/2022 1505   LYMPHSABS 0.7 03/23/2023 1623   MONOABS 1.9 (H) 03/23/2023 1623   EOSABS 0.0 03/23/2023 1623   BASOSABS 0.0 03/23/2023 1623      Latest Ref Rng & Units 03/24/2023    5:30 AM 03/23/2023    7:30 PM 03/23/2023    4:23 PM  CMP  Glucose 70 - 99 mg/dL 96  161  77   BUN 8 - 23 mg/dL 23  27  21     Creatinine 0.61 - 1.24 mg/dL 0.96  0.45  4.09   Sodium 135 - 145 mmol/L 137  137  140   Potassium 3.5 - 5.1 mmol/L 3.4  3.5  2.3   Chloride 98 - 111 mmol/L 109  107  120   CO2 22 - 32 mmol/L 19  21  14    Calcium 8.9 - 10.3 mg/dL 8.0  7.6  5.1   Total Protein 6.5 - 8.1 g/dL 5.3  5.8  3.9   Total Bilirubin 0.3 - 1.2 mg/dL 1.1  1.5  1.4   Alkaline Phos 38 - 126 U/L 27  33  23   AST 15 - 41 U/L 135  171  129   ALT 0 - 44 U/L 43  49  35      Radiology Studies: DG Abd 1 View  Result Date: 03/23/2023 CLINICAL DATA:  Abdominal pain and constipation EXAM: ABDOMEN - 1 VIEW COMPARISON:  None Available. FINDINGS: Relative paucity of bowel gas is noted. No obstructive changes are seen. No free air is noted. Degenerative changes of lumbar spine are seen. No significant retained fecal material is noted. Right hip replacement is seen. IMPRESSION: No significant changes of constipation. Electronically Signed   By: Alcide Clever M.D.   On: 03/23/2023 20:07   CT Knee Left Wo Contrast  Result Date: 03/23/2023 CLINICAL DATA:  Weakness.  Knee trauma.  Fall EXAM: CT OF THE LEFT KNEE WITHOUT CONTRAST TECHNIQUE: Multidetector CT imaging of the left knee was performed according to the standard protocol. Multiplanar CT image reconstructions were also generated. RADIATION DOSE REDUCTION: This exam was performed according to the departmental dose-optimization program which includes automated exposure control, adjustment of the mA and/or kV according to patient size and/or use of iterative reconstruction technique. COMPARISON:  X-ray earlier 03/23/2023. FINDINGS: Bones/Joint/Cartilage No fracture identified. No dislocation. As seen on x-ray there are osteophytes seen of all 3 compartments, greatest of the lateral compartment. There is peaking of the tibial spines. Mild degenerative changes as well along the proximal tibiofibular joint some sclerosis and small osteophytes. Subchondral cyst formation identified along the  posterior aspect of the medial tibial plateau towards the midline. Few other areas as well along the tibial plateau of both mediolateral compartments. Ligaments Suboptimally assessed by CT. Muscles and Tendons Areas of fatty muscle atrophy diffusely. Preserved patellar and quadriceps tendon. Soft tissues Chondrocalcinosis. Small joint effusion. Vascular calcifications are seen about the knee. Mild subcutaneous edema anteriorly. IMPRESSION: Tricompartmental degenerative changes. Chondrocalcinosis. Small joint effusion. Overall if there is further concern of internal derangement dedicated MRI imaging can be considered if the patient is clinically compatible and when clinically appropriate Electronically Signed   By: Karen Kays M.D.   On: 03/23/2023 18:06   DG Chest Port 1 View  Result Date: 03/23/2023 CLINICAL DATA:  Weakness. EXAM: PORTABLE CHEST 1 VIEW COMPARISON:  Chest radiograph 12/27/2022. FINDINGS: Low lung  volumes accentuate the pulmonary vasculature and cardiomediastinal silhouette. No consolidation or pulmonary edema. No pleural effusion or pneumothorax. Visualized bones and upper abdomen are unremarkable. IMPRESSION: No evidence of acute cardiopulmonary disease. Electronically Signed   By: Orvan Falconer M.D.   On: 03/23/2023 16:53   DG Knee Complete 4 Views Left  Result Date: 03/23/2023 CLINICAL DATA:  Weakness. EXAM: LEFT KNEE - COMPLETE 4+ VIEW COMPARISON:  Left knee radiographs 09/16/2019. FINDINGS: Four views of the left knee. No acute fracture or dislocation. Possible small knee joint effusion. 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 Atherosclerotic vascular changes in the surrounding soft tissues. IMPRESSION: 1. No acute fracture or dislocation of the left knee. 2. 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 Electronically Signed   By: Orvan Falconer M.D.   On: 03/23/2023 16:44   CT Head Wo Contrast  Result Date:  03/23/2023 CLINICAL DATA:  Head trauma, minor (Age >= 65y); Neck trauma (Age >= 65y). Fall. EXAM: CT HEAD WITHOUT CONTRAST CT CERVICAL SPINE WITHOUT CONTRAST TECHNIQUE: Multidetector CT imaging of the head and cervical spine was performed following the standard protocol without intravenous contrast. Multiplanar CT image reconstructions of the cervical spine were also generated. RADIATION DOSE REDUCTION: This exam was performed according to the departmental dose-optimization program which includes automated exposure control, adjustment of the mA and/or kV according to patient size and/or use of iterative reconstruction technique. COMPARISON:  Head CT 05/12/2022. FINDINGS: CT HEAD FINDINGS Brain: No acute intracranial hemorrhage. Gray-white differentiation is preserved. No hydrocephalus or extra-axial collection. No mass effect or midline shift. Vascular: No hyperdense vessel or unexpected calcification. Skull: No calvarial fracture or suspicious bone lesion. Skull base is unremarkable. Sinuses/Orbits: Unremarkable. Other: Retained metallic fragment superior to the left ear. CT CERVICAL SPINE FINDINGS Alignment: Trace degenerative retrolisthesis of C3 on C4. Skull base and vertebrae: No acute fracture. Normal craniocervical junction. No suspicious bone lesions. Soft tissues and spinal canal: No prevertebral fluid or swelling. No visible canal hematoma. Disc levels: Mild cervical spondylosis without high-grade spinal canal stenosis. Upper chest: Unremarkable. Other: None. IMPRESSION: 1. No acute intracranial abnormality. 2. No acute cervical spine fracture or traumatic malalignment. Electronically Signed   By: Orvan Falconer M.D.   On: 03/23/2023 16:34   CT Cervical Spine Wo Contrast  Result Date: 03/23/2023 CLINICAL DATA:  Head trauma, minor (Age >= 65y); Neck trauma (Age >= 65y). Fall. EXAM: CT HEAD WITHOUT CONTRAST CT CERVICAL SPINE WITHOUT CONTRAST TECHNIQUE: Multidetector CT imaging of the head and cervical  spine was performed following the standard protocol without intravenous contrast. Multiplanar CT image reconstructions of the cervical spine were also generated. RADIATION DOSE REDUCTION: This exam was performed according to the departmental dose-optimization program which includes automated exposure control, adjustment of the mA and/or kV according to patient size and/or use of iterative reconstruction technique. COMPARISON:  Head CT 05/12/2022. FINDINGS: CT HEAD FINDINGS Brain: No acute intracranial hemorrhage. Gray-white differentiation is preserved. No hydrocephalus or extra-axial collection. No mass effect or midline shift. Vascular: No hyperdense vessel or unexpected calcification. Skull: No calvarial fracture or suspicious bone lesion. Skull base is unremarkable. Sinuses/Orbits: Unremarkable. Other: Retained metallic fragment superior to the left ear. CT CERVICAL SPINE FINDINGS Alignment: Trace degenerative retrolisthesis of C3 on C4. Skull base and vertebrae: No acute fracture. Normal craniocervical junction. No suspicious bone lesions. Soft tissues and spinal canal: No prevertebral fluid or swelling. No visible canal hematoma. Disc levels: Mild cervical spondylosis without high-grade spinal canal stenosis. Upper chest: Unremarkable. Other: None. IMPRESSION: 1.  No acute intracranial abnormality. 2. No acute cervical spine fracture or traumatic malalignment. Electronically Signed   By: Orvan Falconer M.D.   On: 03/23/2023 16:34     Scheduled Meds:  finasteride  5 mg Oral Daily   levothyroxine  25 mcg Oral QAC breakfast   memantine  10 mg Oral BID   omeprazole  20 mg Oral Daily   Ensure Max Protein  11 oz Oral Daily   Continuous Infusions:  sodium chloride 100 mL/hr at 03/23/23 2159     LOS: 0 days   Time spent: 75  Rhetta Mura, MD [facility] To contact the attending provider between 7A-7P or the covering provider during after hours 7P-7A, please log into the web site  [email or web address] and access using universal [facility] password for that web site. If you do not have the password, please call the hospital operator.  03/24/2023, 7:26 AM

## 2023-03-25 DIAGNOSIS — M6282 Rhabdomyolysis: Secondary | ICD-10-CM | POA: Diagnosis not present

## 2023-03-25 LAB — BODY FLUID CULTURE W GRAM STAIN

## 2023-03-25 LAB — BODY FLUID CELL COUNT WITH DIFFERENTIAL
Eos, Fluid: 0 %
Lymphs, Fluid: 1 %
Monocyte-Macrophage-Serous Fluid: 7 % — ABNORMAL LOW (ref 50–90)
Neutrophil Count, Fluid: 92 % — ABNORMAL HIGH (ref 0–25)
Other Cells, Fluid: 0 %
Total Nucleated Cell Count, Fluid: 6900 cu mm — ABNORMAL HIGH (ref 0–1000)

## 2023-03-25 MED ORDER — POTASSIUM CHLORIDE CRYS ER 20 MEQ PO TBCR
40.0000 meq | EXTENDED_RELEASE_TABLET | Freq: Every day | ORAL | Status: DC
  Administered 2023-03-25 – 2023-03-26 (×2): 40 meq via ORAL
  Filled 2023-03-25 (×2): qty 2

## 2023-03-25 MED ORDER — BUPIVACAINE HCL (PF) 0.25 % IJ SOLN
10.0000 mL | Freq: Once | INTRAMUSCULAR | Status: AC
  Administered 2023-03-25: 10 mL via INTRA_ARTICULAR
  Filled 2023-03-25: qty 10

## 2023-03-25 MED ORDER — CHLORPROMAZINE HCL 10 MG PO TABS
10.0000 mg | ORAL_TABLET | Freq: Once | ORAL | Status: AC
  Administered 2023-03-25: 10 mg via ORAL
  Filled 2023-03-25: qty 1

## 2023-03-25 MED ORDER — SODIUM CHLORIDE 0.9 % IV SOLN: INTRAVENOUS | Status: AC

## 2023-03-25 MED ORDER — METHYLPREDNISOLONE ACETATE 40 MG/ML IJ SUSP
40.0000 mg | Freq: Once | INTRAMUSCULAR | Status: AC
  Administered 2023-03-25: 40 mg via INTRA_ARTICULAR
  Filled 2023-03-25: qty 1

## 2023-03-25 MED ORDER — CHLORHEXIDINE GLUCONATE CLOTH 2 % EX PADS
6.0000 | MEDICATED_PAD | Freq: Every day | CUTANEOUS | Status: DC
  Administered 2023-03-25 – 2023-03-28 (×4): 6 via TOPICAL

## 2023-03-25 NOTE — Plan of Care (Signed)

## 2023-03-25 NOTE — Progress Notes (Signed)
   Subjective:    Patient seen for Dr. Linna Caprice. Left knee MRI revealed no extensor mechanism tear. Significant for severe arthritic changes with moderate effusion. Patient reports today that his knee pain has slightly improved. Resting comfortably in bed.   Objective: Vital signs in last 24 hours: Temp:  [97.5 F (36.4 C)-97.8 F (36.6 C)] 97.8 F (36.6 C) (06/08 1200) Pulse Rate:  [51-58] 54 (06/08 1200) Resp:  [16-20] 18 (06/08 1200) BP: (125-129)/(55-62) 129/62 (06/08 1200) SpO2:  [97 %-99 %] 97 % (06/08 0354)  Intake/Output from previous day:  Intake/Output Summary (Last 24 hours) at 03/25/2023 1313 Last data filed at 03/25/2023 1214 Gross per 24 hour  Intake 1899.54 ml  Output 1325 ml  Net 574.54 ml    Intake/Output this shift: Total I/O In: 360 [P.O.:360] Out: 300 [Urine:300]  Labs: Recent Labs    03/23/23 1623 03/24/23 0530  HGB 12.8* 11.0*   Recent Labs    03/23/23 1623 03/24/23 0530  WBC 13.8* 9.4  RBC 3.99* 3.40*  HCT 38.8* 33.4*  PLT 346 283   Recent Labs    03/23/23 1930 03/24/23 0530  NA 137 137  K 3.5 3.4*  CL 107 109  CO2 21* 19*  BUN 27* 23  CREATININE 1.35* 0.98  GLUCOSE 112* 96  CALCIUM 7.6* 8.0*   No results for input(s): "LABPT", "INR" in the last 72 hours.  Exam: Alert and oriented, no acute distress  Left knee exam: Moderate effusion No significant warmth. No erythema.  No tenderness to palpation of the medial or lateral joint line  Distal pulse 2+. Calf soft and nontender. Motor function intact in LE.  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   Hyperlipidemia LDL goal <100 09/25/2020   Hypertension    Osteoarthritis    S/P right THA, AA 08/27/2013   Severe aortic stenosis by prior echocardiogram 02/02/2022   TTE (12/19/2019): Moderate AS  (mean gradient 21 mmHg). => TTE 02/02/2022: EF> 75%.  Moderate LVH.  GR 1 DD.  Normal RV and RVP.  Severe AS.  Mean gradient 44 mmHg up from 37 mmHg    Assessment/Plan:    Principal Problem:   Rhabdomyolysis Active Problems:   Essential hypertension   Severe aortic stenosis by prior echocardiogram   Hyperlipidemia LDL goal <100   Dementia without behavioral disturbance (HCC)   Hypokalemia   Acute urinary retention   Elevated LFTs   AKI (acute kidney injury) (HCC)   Malnutrition of moderate degree  Estimated body mass index is 31.42 kg/m as calculated from the following:   Height as of this encounter: 6\' 2"  (1.88 m).   Weight as of this encounter: 111 kg.   Discussed patient's case with Dr. Linna Caprice, who recommend aspiration/injection. The left knee was aspirated of 60 cc's without issue and CSI performed (see separate procedure note). Fluid was not indicative of infection, was consistent with inflammatory process. Will send synovial fluid off for cell count to r/o CPPD.   Arther Abbott, PA-C Orthopedic Surgery [phone] 03/25/2023, 1:13 PM

## 2023-03-25 NOTE — Progress Notes (Signed)
Foley catheter was inserted at 0810 since patient had not voided for over 24 hours with three in and out caths for urinary retention.

## 2023-03-25 NOTE — Progress Notes (Signed)
Pt due to void, has had no urine output for PM shift. Pt had two previous In/Out Caths during admission. Pt bladder scanned, retaining 384 ml. Provider notified, writer was advised to do in/out, orders carried out and 0 residual.

## 2023-03-25 NOTE — Progress Notes (Signed)
PROGRESS NOTE   Raymond Cuevas  WGN:[id number] DOB: 02-06-1934 DOA: 03/23/2023 PCP: Merri Brunette, MD  Brief Narrative:   89 year old community dwelling white male (lives alone)-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 bili 1.4, AST/ALT 129/35, magnesium 1.3 WBC 13.8  Admitted for AKI, Rhabdo and fall 6/7 MRI left lower extremity severe full-thickness cartilage loss and extensive degenerative tearing of posterior horn of medial meniscus especially at the posterior meniscal root?  Multiloculated paralabral cyst adjacent to the root of posterior horn with moderate knee effusion-tricompartmental spurring additionally  Hospital-Problem based course  Accidental fall X-ray + CT of left knee concerning for arthritis with mild effusion-Dr. Swinteck orthopedics consulted and MRI shows extensive tearing of the muscles as dictated above Defer to orthopedics next steps in management-knee immobilizer?-Knee sleeve  Severe AoS none TAVR candidate 2/2 dementia Not a candidate for TAVR--symptomatic management only Might need palliative care to see as an outpatient  Rhabdo + ensuant transaminitis  CK, transaminases is trending downward well  Azotemia is resolving well so we will saline lock in the next 24 hours once the CK drops below at thousand-continue saline 50 cc/H  AKI metabolic acidosis multiple electrolyte abnormalities--PTA losartan Hold ACE ARB and thiazide in the outpatient setting forever Periodic checks on magnesium-as has resolved do not need more  replacement, give K-Dur 40  Mild intermittent asthma Stable-continue albuterol every 4 as needed  Elevated PSA squamous cell CA  Mild dementia Not severe-is able to orient to place time person   DVT prophylaxis: SCD Code Status: Full Family Communication: No present-will discuss with orthopedics regarding plan of care Disposition:  Status is: Observation The patient remains OBS appropriate and will d/c before 2 midnights.  Patient will need skilled placement   Subjective:  Overall well-still complaining of left knee pain Eating fair No cp fever  Objective: Vitals:   03/24/23 0902 03/24/23 1259 03/24/23 2100 03/25/23 0354  BP: 110/64 (!) 126/55 (!) 125/57 (!) 127/55  Pulse: 61 (!) 58 (!) 58 (!) 51  Resp: 20 20 20 16   Temp: 98.4 F (36.9 C) 97.7 F (36.5 C) 97.7 F (36.5 C) (!) 97.5 F (36.4 C)  TempSrc: Oral Oral Oral Oral  SpO2: 95% 97% 99% 97%  Weight:      Height:        Intake/Output Summary (Last 24 hours) at 03/25/2023 1123 Last data filed at 03/25/2023 0900 Gross per 24 hour  Intake 1899.54 ml  Output 1025 ml  Net 874.54 ml    Filed Weights   03/23/23 1543  Weight: 111 kg    Examination:  Awake coherent no distress no icterus no pallor Chest clear no added sounds Impressive holosystolic murmur grade 4 best heard at [facility] Abdomen soft no rebound no guarding Swollen left lower extremity-did not provocatively test secondary to pain  Data Reviewed: personally reviewed   CBC    Component Value Date/Time   WBC 9.4 03/24/2023 0530   RBC 3.40 (L) 03/24/2023 0530   HGB 11.0 (L) 03/24/2023 0530   HGB 15.5 11/30/2022 1505   HCT 33.4 (L) 03/24/2023 0530   HCT  46.2 11/30/2022 1505   PLT 283 03/24/2023 0530   PLT 375 11/30/2022 1505   MCV 98.2 03/24/2023 0530   MCV 95 11/30/2022 1505   MCH 32.4 03/24/2023 0530   MCHC 32.9 03/24/2023 0530   RDW 14.2 03/24/2023 0530   RDW 12.4 11/30/2022 1505   LYMPHSABS 0.7 03/23/2023 1623   MONOABS 1.9 (H)  03/23/2023 1623   EOSABS 0.0 03/23/2023 1623   BASOSABS 0.0 03/23/2023 1623      Latest Ref Rng & Units 03/24/2023    5:30 AM 03/23/2023    7:30 PM 03/23/2023    4:23 PM  CMP  Glucose 70 - 99 mg/dL 96  161  77   BUN 8 - 23 mg/dL 23  27  21    Creatinine 0.61 - 1.24 mg/dL 0.96  0.45  4.09   Sodium 135 - 145 mmol/L 137  137  140   Potassium 3.5 - 5.1 mmol/L 3.4  3.5  2.3   Chloride 98 - 111 mmol/L 109  107  120   CO2 22 - 32 mmol/L 19  21  14    Calcium 8.9 - 10.3 mg/dL 8.0  7.6  5.1   Total Protein 6.5 - 8.1 g/dL 5.3  5.8  3.9   Total Bilirubin 0.3 - 1.2 mg/dL 1.1  1.5  1.4   Alkaline Phos 38 - 126 U/L 27  33  23   AST 15 - 41 U/L 135  171  129   ALT 0 - 44 U/L 43  49  35      Radiology Studies: MR KNEE LEFT WO CONTRAST  Result Date: 03/25/2023 CLINICAL DATA:  Knee trauma, left knee pain EXAM: MRI OF THE LEFT KNEE WITHOUT CONTRAST TECHNIQUE: Multiplanar, multisequence MR imaging of the knee was performed. No intravenous contrast was administered. COMPARISON:  CT left knee 03/23/2023 and radiographs from the same day. FINDINGS: MENISCI Medial meniscus: Extensive degenerative tearing of the posterior horn with amorphous appearance especially at the posterior meniscal root. The abnormal degenerative signal extends into the midbody. The patient has known chondrocalcinosis but the severity is highly disproportionate to the calcification in the meniscus. Suspected posterior multiloculated paralabral cyst measuring 1.5 by 1.2 by 1.4 cm adjacent to the root of the posterior horn, extending beyond the joint capsule margin on image 20 series 8. Lateral meniscus: Grade 1 signal with some mild fraying the free edge of the midbody but without a well-defined tear. LIGAMENTS Cruciates: Expansion and increased signal in the distal PCL probably from degeneration, less likely sprain, image 15 series 13. ACL intact. Collaterals:  Intact CARTILAGE Patellofemoral: Severe full-thickness cartilage loss laterally in the  patellofemoral joint. Marginal spurring. Medial: Moderate to prominent degenerative chondral loss especially along the medial femoral condyle. Marginal spurring. Lateral:  Mild chondral thinning laterally.  Marginal spurring. Joint: Moderate knee effusion. Irregularity along Hoffa's have fat pad posteriorly example on image 17 series 12 suggesting synovitis. Popliteal Fossa: In addition to the suspected paralabral cyst noted above, there is diffuse infiltrative edema in the popliteal space as well as a small Baker's cyst. Extensor Mechanism:  Mild distal patellar tendinopathy. Bones: Posterior geode along the tibial spine extends into the posterolateral portion of the medial tibial plateau measures about 1.6 cm. Mild surrounding marrow edema. Other: Subcutaneous edema along the knee especially laterally. Substantial infiltrative edema surrounding the suprapatellar bursa and in the popliteal space. IMPRESSION: 1. Extensive degenerative tearing of the posterior horn medial meniscus especially at the posterior meniscal root. Suspected posterior  multiloculated paralabral cyst adjacent to the root of the posterior horn, extending beyond the joint capsule margin. 2. Moderate knee effusion with suspected synovitis along Hoffa's fat pad posteriorly. 3. Substantial infiltrative edema in the popliteal space and surrounding the suprapatellar bursa. 4. Mild distal patellar tendinopathy. 5. Expansion and increased signal in the distal PCL probably from degeneration, less likely sprain. 6. Posterior geode along the tibial spine extends into the posterolateral portion of the medial tibial plateau. 7. Severe full-thickness chondral thinning laterally in the patellofemoral joint moderate chondral thinning in the medial compartment and mild chondral thinning in the lateral compartment. Tricompartmental spurring. Electronically Signed   By: Gaylyn Rong M.D.   On: 03/25/2023 10:23   DG Abd 1 View  Result Date:  03/23/2023 CLINICAL DATA:  Abdominal pain and constipation EXAM: ABDOMEN - 1 VIEW COMPARISON:  None Available. FINDINGS: Relative paucity of bowel gas is noted. No obstructive changes are seen. No free air is noted. Degenerative changes of lumbar spine are seen. No significant retained fecal material is noted. Right hip replacement is seen. IMPRESSION: No significant changes of constipation. Electronically Signed   By: Alcide Clever M.D.   On: 03/23/2023 20:07   CT Knee Left Wo Contrast  Result Date: 03/23/2023 CLINICAL DATA:  Weakness.  Knee trauma.  Fall EXAM: CT OF THE LEFT KNEE WITHOUT CONTRAST TECHNIQUE: Multidetector CT imaging of the left knee was performed according to the standard protocol. Multiplanar CT image reconstructions were also generated. RADIATION DOSE REDUCTION: This exam was performed according to the departmental dose-optimization program which includes automated exposure control, adjustment of the mA and/or kV according to patient size and/or use of iterative reconstruction technique. COMPARISON:  X-ray earlier 03/23/2023. FINDINGS: Bones/Joint/Cartilage No fracture identified. No dislocation. As seen on x-ray there are osteophytes seen of all 3 compartments, greatest of the lateral compartment. There is peaking of the tibial spines. Mild degenerative changes as well along the proximal tibiofibular joint some sclerosis and small osteophytes. Subchondral cyst formation identified along the posterior aspect of the medial tibial plateau towards the midline. Few other areas as well along the tibial plateau of both mediolateral compartments. Ligaments Suboptimally assessed by CT. Muscles and Tendons Areas of fatty muscle atrophy diffusely. Preserved patellar and quadriceps tendon. Soft tissues Chondrocalcinosis. Small joint effusion. Vascular calcifications are seen about the knee. Mild subcutaneous edema anteriorly. IMPRESSION: Tricompartmental degenerative changes. Chondrocalcinosis. Small joint  effusion. Overall if there is further concern of internal derangement dedicated MRI imaging can be considered if the patient is clinically compatible and when clinically appropriate Electronically Signed   By: Karen Kays M.D.   On: 03/23/2023 18:06   DG Chest Port 1 View  Result Date: 03/23/2023 CLINICAL DATA:  Weakness. EXAM: PORTABLE CHEST 1 VIEW COMPARISON:  Chest radiograph 12/27/2022. FINDINGS: Low lung volumes accentuate the pulmonary vasculature and cardiomediastinal silhouette. No consolidation or pulmonary edema. No pleural effusion or pneumothorax. Visualized bones and upper abdomen are unremarkable. IMPRESSION: No evidence of acute cardiopulmonary disease. Electronically Signed   By: Orvan Falconer M.D.   On: 03/23/2023 16:53   DG Knee Complete 4 Views Left  Result Date: 03/23/2023 CLINICAL DATA:  Weakness. EXAM: LEFT KNEE - COMPLETE 4+ VIEW COMPARISON:  Left knee radiographs 09/16/2019. FINDINGS: Four views of the left knee. No acute fracture or dislocation. Possible small knee joint effusion. 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 Atherosclerotic vascular changes in the surrounding soft tissues. IMPRESSION: 1. No acute fracture or dislocation of the left knee. 2. 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 Electronically Signed   By: Orvan Falconer M.D.   On: 03/23/2023 16:44   CT Head Wo Contrast  Result Date: 03/23/2023 CLINICAL DATA:  Head trauma, minor (Age >= 65y); Neck trauma (Age >= 65y). Fall. EXAM: CT HEAD WITHOUT CONTRAST CT CERVICAL SPINE WITHOUT CONTRAST TECHNIQUE: Multidetector CT imaging of the head and cervical spine was performed following the standard protocol without intravenous contrast. Multiplanar CT image reconstructions of the cervical spine were also generated. RADIATION DOSE REDUCTION: This exam was performed according to the departmental dose-optimization program which includes automated exposure  control, adjustment of the mA and/or kV according to patient size and/or use of iterative reconstruction technique. COMPARISON:  Head CT 05/12/2022. FINDINGS: CT HEAD FINDINGS Brain: No acute intracranial hemorrhage. Gray-white differentiation is preserved. No hydrocephalus or extra-axial collection. No mass effect or midline shift. Vascular: No hyperdense vessel or unexpected calcification. Skull: No calvarial fracture or suspicious bone lesion. Skull base is unremarkable. Sinuses/Orbits: Unremarkable. Other: Retained metallic fragment superior to the left ear. CT CERVICAL SPINE FINDINGS Alignment: Trace degenerative retrolisthesis of C3 on C4. Skull base and vertebrae: No acute fracture. Normal craniocervical junction. No suspicious bone lesions. Soft tissues and spinal canal: No prevertebral fluid or swelling. No visible canal hematoma. Disc levels: Mild cervical spondylosis without high-grade spinal canal stenosis. Upper chest: Unremarkable. Other: None. IMPRESSION: 1. No acute intracranial abnormality. 2. No acute cervical spine fracture or traumatic malalignment. Electronically Signed   By: Orvan Falconer M.D.   On: 03/23/2023 16:34   CT Cervical Spine Wo Contrast  Result Date: 03/23/2023 CLINICAL DATA:  Head trauma, minor (Age >= 65y); Neck trauma (Age >= 65y). Fall. EXAM: CT HEAD WITHOUT CONTRAST CT CERVICAL SPINE WITHOUT CONTRAST TECHNIQUE: Multidetector CT imaging of the head and cervical spine was performed following the standard protocol without intravenous contrast. Multiplanar CT image reconstructions of the cervical spine were also generated. RADIATION DOSE REDUCTION: This exam was performed according to the departmental dose-optimization program which includes automated exposure control, adjustment of the mA and/or kV according to patient size and/or use of iterative reconstruction technique. COMPARISON:  Head CT 05/12/2022. FINDINGS: CT HEAD FINDINGS Brain: No acute intracranial hemorrhage.  Gray-white differentiation is preserved. No hydrocephalus or extra-axial collection. No mass effect or midline shift. Vascular: No hyperdense vessel or unexpected calcification. Skull: No calvarial fracture or suspicious bone lesion. Skull base is unremarkable. Sinuses/Orbits: Unremarkable. Other: Retained metallic fragment superior to the left ear. CT CERVICAL SPINE FINDINGS Alignment: Trace degenerative retrolisthesis of C3 on C4. Skull base and vertebrae: No acute fracture. Normal craniocervical junction. No suspicious bone lesions. Soft tissues and spinal canal: No prevertebral fluid or swelling. No visible canal hematoma. Disc levels: Mild cervical spondylosis without high-grade spinal canal stenosis. Upper chest: Unremarkable. Other: None. IMPRESSION: 1. No acute intracranial abnormality. 2. No acute cervical spine fracture or traumatic malalignment. Electronically Signed   By: Orvan Falconer M.D.   On: 03/23/2023 16:34     Scheduled Meds:  Chlorhexidine Gluconate Cloth  6 each Topical Daily   finasteride  5 mg Oral Daily   levothyroxine  25 mcg Oral QAC breakfast   memantine  10 mg Oral BID   pantoprazole  40 mg Oral Daily   potassium chloride  40 mEq Oral Daily   Ensure Max Protein  11 oz Oral Daily   Continuous Infusions:  sodium chloride 50 mL/hr at 03/25/23 1057     LOS: 1 day   Time spent: 35  Jai-Gurmukh  Mahala Menghini, MD [facility] To contact the attending provider between 7A-7P or the covering provider during after hours 7P-7A, please log into the web site [email or web address] and access using universal [place] password for that web site. If you do not have the password, please call the hospital operator.  03/25/2023, 11:23 AM

## 2023-03-25 NOTE — Procedures (Signed)
After verbal consent was obtained, the left knee was identified and prepared with Betadine. The knee was then injected from a superolateral approach with a mixture of 3cc of Lidocaine. Adequate time was allowed for the medication to take effect. An 18 gauge needle was then introduced from the superolateral aspect and then 60 cc's of cloudy yellow appearing fluid was aspirated. The knee was subsequently injected with 1 cc 40 mg/mL Depo-medrol. The patient tolerated the procedure well, without complications. The injection site was dressed with an adhesive bandage. The patient was advised to alert the nurse with any adverse reactions. The patient was advised to ice the knee intermittently over the next 24 hours.  Arther Abbott, PA-C Orthopedic Surgery [facility] [facility]

## 2023-03-26 DIAGNOSIS — M6282 Rhabdomyolysis: Secondary | ICD-10-CM | POA: Diagnosis not present

## 2023-03-26 LAB — BASIC METABOLIC PANEL
Anion gap: 6 (ref 5–15)
BUN: 20 mg/dL (ref 8–23)
CO2: 23 mmol/L (ref 22–32)
Calcium: 8.1 mg/dL — ABNORMAL LOW (ref 8.9–10.3)
Chloride: 110 mmol/L (ref 98–111)
Creatinine, Ser: 0.9 mg/dL (ref 0.61–1.24)
GFR, Estimated: >60 mL/min (ref >60)
Glucose, Bld: 103 mg/dL — ABNORMAL HIGH (ref 70–99)
Potassium: 4 mmol/L (ref 3.5–5.1)
Sodium: 139 mmol/L (ref 135–145)

## 2023-03-26 LAB — BODY FLUID CULTURE W GRAM STAIN: Culture: NO GROWTH

## 2023-03-26 LAB — CALCIUM, IONIZED: Calcium, Ionized, Serum: 4.8 mg/dL (ref 4.5–5.6)

## 2023-03-26 MED ORDER — SODIUM CHLORIDE 0.9 % IV SOLN: INTRAVENOUS | Status: DC

## 2023-03-26 NOTE — Progress Notes (Signed)
PROGRESS NOTE   Raymond Cuevas  ZOX:[id number] DOB: 12/29/1933 DOA: 03/23/2023 PCP: Merri Brunette, MD  Brief Narrative:   89 year old community dwelling white male (lives alone)-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 bili 1.4, AST/ALT 129/35, magnesium 1.3 WBC 13.8  Admitted for AKI, Rhabdo and fall  6/7 MRI left lower extremity severe full-thickness cartilage loss and extensive degenerative tearing of posterior horn of medial meniscus especially at the posterior meniscal root?  Multiloculated paralabral cyst adjacent to the root of posterior horn with moderate knee effusion-tricompartmental spurring additionally 6/8 dr swinteck L knee aspirated 60 cc, 1 cc DepoMedrol administered  Hospital-Problem based course  Accidental fall X-ray + CT of left knee concerning for arthritis with mild effusion-Dr. Swinteck orthopedics consulted and MRI shows extensive tearing of the muscles as dictated above Appreciate knee injection for ? Additional concern CPPD--expect pain will improve slowly For now hold colchicine or alternative  Severe AoS none TAVR candidate 2/2 dementia Not a candidate for TAVR--symptomatic management only Might need palliative care to see as an outpatient  Sinus bradycardia Constitutional--as no pauses, will d/c tele  Rhabdo + ensuant transaminitis  Continue saline 50 cc/H-- CK in am  AKI metabolic acidosis multiple electrolyte abnormalities--PTA losartan Hold ACE ARB and thiazide in the outpatient setting forever Potassium  normalized--stop replacement  Mild intermittent asthma Stable-continue albuterol every 4 as needed  Elevated PSA squamous cell CA OP surveillance Cont Finasteride 5 qd  Mild dementia/Presbycusis Not severe-is able to orient to place time person He now has his hearing aids and can comprehend better   DVT prophylaxis: SCD Code Status: Full Family Communication: discussed with Darl Pikes, friend @ Bedside Disposition:  Status is: Observation The patient remains OBS appropriate and will d/c before 2 midnights.  Patient will need skilled placement   Subjective:  L knee pain 8/10--relieved with meds Otherwise fair Eating some No fever n/v  Objective: Vitals:   03/25/23 1200 03/25/23 2110 03/26/23 0538 03/26/23 1252  BP: 129/62 139/64 114/63 (!) 141/55  Pulse: (!) 54 62 (!) 53 (!) 57  Resp: 18 20 20 16   Temp: 97.8 F (36.6 C) 97.6 F (36.4 C) 97.6 F (36.4 C) 97.8 F (36.6 C)  TempSrc: Oral Oral Oral Oral  SpO2: 97% 97% 95% 95%  Weight:      Height:        Intake/Output Summary (Last 24 hours) at 03/26/2023 1716 Last data filed at 03/26/2023 1630 Gross per 24 hour  Intake 1394.4 ml  Output 2850 ml  Net -1455.6 ml    Filed Weights   03/23/23 1543  Weight: 111 kg    Examination:   coherent no distress no icterus no pallor Chest clear no added sounds  holosystolic murmur grade 4 best heard at LLSE--sinus brady on the Abdomen soft no rebound no guarding Swollen left lower extremity-looks improved compared to prior  Data Reviewed: personally reviewed   CBC    Component Value Date/Time   WBC 9.4 03/24/2023 0530   RBC 3.40 (L) 03/24/2023 0530   HGB 11.0 (L) 03/24/2023 0530   HGB 15.5 11/30/2022 1505  HCT 33.4 (L) 03/24/2023 0530   HCT 46.2 11/30/2022 1505   PLT 283 03/24/2023 0530   PLT 375 11/30/2022 1505   MCV 98.2 03/24/2023 0530   MCV 95 11/30/2022 1505   MCH 32.4 03/24/2023 0530   MCHC 32.9 03/24/2023 0530   RDW 14.2 03/24/2023 0530   RDW 12.4  11/30/2022 1505   LYMPHSABS 0.7 03/23/2023 1623   MONOABS 1.9 (H) 03/23/2023 1623   EOSABS 0.0 03/23/2023 1623   BASOSABS 0.0 03/23/2023 1623      Latest Ref Rng & Units 03/26/2023    5:31 AM 03/24/2023    5:30 AM 03/23/2023    7:30 PM  CMP  Glucose 70 - 99 mg/dL 782  96  956   BUN 8 - 23 mg/dL 20  23  27    Creatinine 0.61 - 1.24 mg/dL 2.13  0.86  5.78   Sodium 135 - 145 mmol/L 139  137  137   Potassium 3.5 - 5.1 mmol/L 4.0  3.4  3.5   Chloride 98 - 111 mmol/L 110  109  107   CO2 22 - 32 mmol/L 23  19  21    Calcium 8.9 - 10.3 mg/dL 8.1  8.0  7.6   Total Protein 6.5 - 8.1 g/dL  5.3  5.8   Total Bilirubin 0.3 - 1.2 mg/dL  1.1  1.5   Alkaline Phos 38 - 126 U/L  27  33   AST 15 - 41 U/L  135  171   ALT 0 - 44 U/L  43  49      Radiology Studies: MR KNEE LEFT WO CONTRAST  Result Date: 03/25/2023 CLINICAL DATA:  Knee trauma, left knee pain EXAM: MRI OF THE LEFT KNEE WITHOUT CONTRAST TECHNIQUE: Multiplanar, multisequence MR imaging of the knee was performed. No intravenous contrast was administered. COMPARISON:  CT left knee 03/23/2023 and radiographs from the same day. FINDINGS: MENISCI Medial meniscus: Extensive degenerative tearing of the posterior horn with amorphous appearance especially at the posterior meniscal root. The abnormal degenerative signal extends into the midbody. The patient has known chondrocalcinosis but the severity is highly disproportionate to the calcification in the meniscus. Suspected posterior multiloculated paralabral cyst measuring 1.5 by 1.2 by 1.4 cm adjacent to the root of the posterior horn, extending beyond the joint capsule margin on image 20 series 8. Lateral meniscus: Grade 1 signal with some mild fraying the free edge of the midbody but without a well-defined tear. LIGAMENTS Cruciates: Expansion and increased signal in the distal PCL probably from degeneration, less likely sprain, image 15 series 13. ACL intact. Collaterals:  Intact CARTILAGE Patellofemoral:  Severe full-thickness cartilage loss laterally in the patellofemoral joint. Marginal spurring. Medial: Moderate to prominent degenerative chondral loss especially along the medial femoral condyle. Marginal spurring. Lateral:  Mild chondral thinning laterally.  Marginal spurring. Joint: Moderate knee effusion. Irregularity along Hoffa's have fat pad posteriorly example on image 17 series 12 suggesting synovitis. Popliteal Fossa: In addition to the suspected paralabral cyst noted above, there is diffuse infiltrative edema in the popliteal space as well as a small Baker's cyst. Extensor Mechanism:  Mild distal patellar tendinopathy. Bones: Posterior geode along the tibial spine extends into the posterolateral portion of the medial tibial plateau measures about 1.6 cm. Mild surrounding marrow edema. Other: Subcutaneous edema along the knee especially laterally. Substantial infiltrative edema surrounding the suprapatellar bursa and in the popliteal space. IMPRESSION: 1. Extensive degenerative tearing of the posterior horn medial meniscus especially at the posterior meniscal  root. Suspected posterior multiloculated paralabral cyst adjacent to the root of the posterior horn, extending beyond the joint capsule margin. 2. Moderate knee effusion with suspected synovitis along Hoffa's fat pad posteriorly. 3. Substantial infiltrative edema in the popliteal space and surrounding the suprapatellar bursa. 4. Mild distal patellar tendinopathy. 5. Expansion and increased signal in the distal PCL probably from degeneration, less likely sprain. 6. Posterior geode along the tibial spine extends into the posterolateral portion of the medial tibial plateau. 7. Severe full-thickness chondral thinning laterally in the patellofemoral joint moderate chondral thinning in the medial compartment and mild chondral thinning in the lateral compartment. Tricompartmental spurring. Electronically Signed   By: Gaylyn Rong M.D.   On: 03/25/2023  10:23     Scheduled Meds:  Chlorhexidine Gluconate Cloth  6 each Topical Daily   finasteride  5 mg Oral Daily   levothyroxine  25 mcg Oral QAC breakfast   memantine  10 mg Oral BID   pantoprazole  40 mg Oral Daily   Ensure Max Protein  11 oz Oral Daily   Continuous Infusions:    LOS: 2 days   Time spent: 6  Rhetta Mura, MD [facility] To contact the attending provider between 7A-7P or the covering provider during after hours 7P-7A, please log into the web site [email or web address] and access using universal [place] password for that web site. If you do not have the password, please call the hospital operator.  03/26/2023, 5:16 PM

## 2023-03-27 DIAGNOSIS — M6282 Rhabdomyolysis: Secondary | ICD-10-CM | POA: Diagnosis not present

## 2023-03-27 LAB — BODY FLUID CULTURE W GRAM STAIN

## 2023-03-27 LAB — BASIC METABOLIC PANEL
Anion gap: 8 (ref 5–15)
BUN: 19 mg/dL (ref 8–23)
CO2: 23 mmol/L (ref 22–32)
Calcium: 8.2 mg/dL — ABNORMAL LOW (ref 8.9–10.3)
Chloride: 109 mmol/L (ref 98–111)
Creatinine, Ser: 0.88 mg/dL (ref 0.61–1.24)
GFR, Estimated: >60 mL/min (ref >60)
Glucose, Bld: 98 mg/dL (ref 70–99)
Potassium: 4 mmol/L (ref 3.5–5.1)
Sodium: 140 mmol/L (ref 135–145)

## 2023-03-27 MED ORDER — TAMSULOSIN HCL 0.4 MG PO CAPS
0.4000 mg | ORAL_CAPSULE | Freq: Every day | ORAL | Status: DC
  Administered 2023-03-27: 0.4 mg via ORAL
  Filled 2023-03-27: qty 1

## 2023-03-27 NOTE — TOC Initial Note (Signed)
Transition of Care [facility]) - Initial/Assessment Note    Patient Details  Name: Raymond Cuevas MRN: [id number] Date of Birth: 07-Oct-1934  Transition of [facility]) CM/SW Contact:    Erin Sons, LCSW Phone Number: 03/27/2023, 4:06 PM  Clinical Narrative:                  CSW met with pt and explained SNF recommendation. Pt agreeable to SNF w/u. Fl2 completed and bed requests in hub.   1400: CSW called pt's daughter; no answer and no voicemail available.  CSW called pt's friend Darl Pikes who has been assisting pt since daughter had a stroke. Darl Pikes reports preference for [facility]. CSW awaiting for [facility] to review referral. CSW provided additional offers and Darl Pikes reports 2nd choice for [facility].  CSW contacted [facility] to confirm bed. They won't know about their bed availability until tomorrow.   1600: Still awaiting on response for [facility].    Barriers to Discharge: No SNF bed   Patient Goals and CMS Choice            Expected Discharge Plan and Services                                              Prior Living Arrangements/Services                       Activities of Daily Living Home Assistive Devices/Equipment: Cane (specify quad or straight), Walker (specify type) ADL Screening (condition at time of admission) Patient's cognitive ability adequate to safely complete daily activities?: Yes Is the patient deaf or have difficulty hearing?: Yes Does the patient have difficulty seeing, even when wearing glasses/contacts?: No Does the patient have difficulty concentrating, remembering, or making decisions?: No Patient able to express need for assistance with ADLs?: Yes Does the patient have difficulty dressing or bathing?: No Independently performs ADLs?: Yes (appropriate for developmental age) Does the patient have difficulty walking or climbing stairs?: Yes Weakness of Legs: Both Weakness of Arms/Hands: None  Permission Sought/Granted                   Emotional Assessment              Admission diagnosis:  Rhabdomyolysis [M62.82] Electrolyte disturbance [E87.8] Fall in home, initial encounter [W19.XXXA, Y92.009] Traumatic rhabdomyolysis, initial encounter (HCC) [T79.6XXA] Patient Active Problem List   Diagnosis Date Noted   Malnutrition of moderate degree 03/24/2023   Rhabdomyolysis 03/23/2023   Dementia without behavioral disturbance (HCC) 03/23/2023   Hypokalemia 03/23/2023   Acute urinary retention 03/23/2023   Elevated LFTs 03/23/2023   AKI (acute kidney injury) (HCC) 03/23/2023   Goals of care, counseling/discussion 02/28/2022   Severe aortic stenosis by prior echocardiogram 02/02/2022   Hyperlipidemia LDL goal <100 09/25/2020   Dyspnea on exertion 01/21/2017   Essential hypertension 01/21/2017   Morbid obesity (HCC) 08/28/2013   S/P right THA, AA 08/27/2013   PCP:  Merri Brunette, MD Pharmacy:   [facility] [address], [place] - [address] [place] [address] Phone: [phone] Fax: [phone]     Social Determinants of Health (SDOH) Social History: SDOH Screenings   Food Insecurity: No Food Insecurity (03/23/2023)  Housing: Low Risk  (03/23/2023)  Transportation Needs: No Transportation Needs (03/23/2023)  Utilities: Not At Risk (03/23/2023)  Tobacco Use: Low Risk  (03/23/2023)   SDOH Interventions:     Readmission Risk Interventions     No data to display

## 2023-03-27 NOTE — Plan of Care (Signed)

## 2023-03-27 NOTE — Progress Notes (Signed)
Notified MD that Foley Catheter was taken out at 1126. Patient urinated 175 mL at 1525. Bladder scanned patient within 15 minutes of voiding and patient had 233 mL in bladder. MD instructed RN to replace Foley Catheter. Foley Catheter was placed at 1625.

## 2023-03-27 NOTE — Care Management Important Message (Signed)
Important Message  Patient Details IM Letter placed in room. Name: Raymond Cuevas MRN: [id number] Date of Birth: 1933/12/16   Medicare Important Message Given:  Yes     Caren Macadam 03/27/2023, 1:08 PM

## 2023-03-27 NOTE — NC FL2 (Signed)
[place] MEDICAID FL2 LEVEL OF CARE FORM     IDENTIFICATION  Patient Name: Raymond Cuevas Birthdate: [birth year] Sex: male Admission Date (Current Location): 03/23/2023  [facility] and [place] Number:  Producer, television/film/video and Address:  [facility], [address] [place], [place] [address]      Provider Number: [id number]  Attending Physician Name and Address:  Rhetta Mura, MD  Relative Name and Phone Number:  Octavia, Bronkema (Daughter)  [phone] [facility])    Current Level of Care: Hospital Recommended Level of Care: Skilled Nursing Facility Prior Approval Number:    Date Approved/Denied:   PASRR Number: [id number]  Discharge Plan: SNF    Current Diagnoses: Patient Active Problem List   Diagnosis Date Noted   Malnutrition of moderate degree 03/24/2023   Rhabdomyolysis 03/23/2023   Dementia without behavioral disturbance (HCC) 03/23/2023   Hypokalemia 03/23/2023   Acute urinary retention 03/23/2023   Elevated LFTs 03/23/2023   AKI (acute kidney injury) (HCC) 03/23/2023   Goals of care, counseling/discussion 02/28/2022   Severe aortic stenosis by prior echocardiogram 02/02/2022   Hyperlipidemia LDL goal <100 09/25/2020   Dyspnea on exertion 01/21/2017   Essential hypertension 01/21/2017   Morbid obesity (HCC) 08/28/2013   S/P right THA, AA 08/27/2013    Orientation RESPIRATION BLADDER Height & Weight     Self, Time, Situation, Place  Normal Continent Weight: 244 lb 11.4 oz (111 kg) Height:  6\' 2"  (188 cm)  BEHAVIORAL SYMPTOMS/MOOD NEUROLOGICAL BOWEL NUTRITION STATUS      Continent Diet (see d/c summary)  AMBULATORY STATUS COMMUNICATION OF NEEDS Skin   Extensive Assist Verbally Other (Comment) (skin tear left elbow; irritant dermititis buttocks)                       Personal Care Assistance Level of Assistance  Bathing, Feeding, Dressing Bathing Assistance: Limited assistance Feeding assistance:  Independent Dressing Assistance: Limited assistance     Functional Limitations Info  Sight, Hearing, Speech Sight Info: Adequate Hearing Info: Impaired Speech Info: Adequate    SPECIAL CARE FACTORS FREQUENCY  PT (By licensed PT), OT (By licensed OT)     PT Frequency: 5x/week OT Frequency: 5x/week            Contractures Contractures Info: Not present    Additional Factors Info  Code Status, Allergies Code Status Info: full code Allergies Info: Morphine And Codeine, Sulfa Antibiotics           Current Medications (03/27/2023):  This is the current hospital active medication list Current Facility-Administered Medications  Medication Dose Route Frequency Provider Last Rate Last Admin   acetaminophen (TYLENOL) tablet 650 mg  650 mg Oral Q6H PRN Doutova, Anastassia, MD       Or   acetaminophen (TYLENOL) suppository 650 mg  650 mg Rectal Q6H PRN Doutova, Anastassia, MD       albuterol (PROVENTIL) (2.5 MG/3ML) 0.083% nebulizer solution 2.5 mg  2.5 mg Nebulization Q4H PRN Doutova, Anastassia, MD       Chlorhexidine Gluconate Cloth 2 % PADS 6 each  6 each Topical Daily Rhetta Mura, MD   6 each at 03/26/23 1006   finasteride (PROSCAR) tablet 5 mg  5 mg Oral Daily Doutova, Anastassia, MD   5 mg at 03/27/23 0843   HYDROcodone-acetaminophen (NORCO/VICODIN) 5-325 MG per tablet 1-2 tablet  1-2 tablet Oral Q4H PRN Therisa Doyne, MD   2 tablet at 03/26/23 2307   levothyroxine (SYNTHROID) tablet 25 mcg  25 mcg Oral QAC breakfast Therisa Doyne, MD   25 mcg at 03/27/23 0607   memantine (NAMENDA) tablet 10 mg  10 mg Oral BID Therisa Doyne, MD   10 mg at 03/27/23 4098   Muscle Rub CREA   Topical PRN Rhetta Mura, MD   1 Application at 03/27/23 1119   ondansetron (ZOFRAN) tablet 4 mg  4 mg Oral Q6H PRN Therisa Doyne, MD       Or   ondansetron (ZOFRAN) injection 4 mg  4 mg Intravenous Q6H PRN Doutova, Anastassia, MD       Oral care mouth rinse  15 mL  Mouth Rinse PRN Doutova, Anastassia, MD       pantoprazole (PROTONIX) EC tablet 40 mg  40 mg Oral Daily Doutova, Anastassia, MD   40 mg at 03/27/23 0842   protein supplement (ENSURE MAX) liquid  11 oz Oral Daily Rhetta Mura, MD   11 oz at 03/27/23 0844     Discharge Medications: Please see discharge summary for a list of discharge medications.  Relevant Imaging Results:  Relevant Lab Results:   Additional Information SSN [id number] [place], [place]

## 2023-03-27 NOTE — Progress Notes (Signed)
Physical Therapy Treatment Patient Details Name: Raymond Cuevas MRN: [id number] DOB: Oct 23, 1933 Today's Date: 03/27/2023   History of Present Illness 89 y.o. male with medical history significant of COPD, severe aortic stenosis, HTN, HLD, obesity, dementia, GERD, hypothyrodism. Presented with  fall, fatigue  Pt was found down in his home lives alone. Dx of rhabdomyolysis, AKI, dehydration, urinary retention.    PT Comments    Progressing with mobility. When asked if he felt better or if pain was better today, pt stated "Im not sure." He participated well. Assistance provided for toileting hygiene. Assisted pt back to bed at his request. Continue to recommend rehab after this hospital stay.     Recommendations for follow up therapy are one component of a multi-disciplinary discharge planning process, led by the attending physician.  Recommendations may be updated based on patient status, additional functional criteria and insurance authorization.  Follow Up Recommendations       Assistance Recommended at Discharge Intermittent Supervision/Assistance  Patient can return home with the following Assistance with cooking/housework;Assist for transportation;Help with stairs or ramp for entrance;A lot of help with walking and/or transfers;A lot of help with bathing/dressing/bathroom   Equipment Recommendations  None recommended by PT    Recommendations for Other Services       Precautions / Restrictions Precautions Precautions: Fall Restrictions Weight Bearing Restrictions: No     Mobility  Bed Mobility Overal bed mobility: Needs Assistance Bed Mobility: Supine to Sit, Sit to Supine     Supine to sit: HOB elevated, Min guard Sit to supine: Min guard, HOB elevated        Transfers Overall transfer level: Needs assistance Equipment used: Rolling walker (2 wheels) Transfers: Sit to/from Stand, Bed to chair/wheelchair/BSC Sit to Stand: Min assist, From elevated surface    Step pivot transfers: Min assist       General transfer comment: Assist to power up, steady. Cues for safety, technique, hand placement. Increased time. Step pivot over to bsc using RW.    Ambulation/Gait Ambulation/Gait assistance: Min guard Gait Distance (Feet): 5 Feet Assistive device: Rolling walker (2 wheels)         General Gait Details: Pt walked a short distance from bsc to [facility] with RW. Min guard for safety. Cues provided. Steady with RW.   Stairs             Wheelchair Mobility    Modified Rankin (Stroke Patients Only)       Balance Overall balance assessment: Needs assistance, History of Falls         Standing balance support: Bilateral upper extremity supported, During functional activity, Reliant on assistive device for balance Standing balance-Leahy Scale: Poor                              Cognition Arousal/Alertness: Awake/alert Behavior During Therapy: WFL for tasks assessed/performed Overall Cognitive Status: No family/caregiver present to determine baseline cognitive functioning                                 General Comments: followed commands well on today. pleasant        Exercises      General Comments        Pertinent Vitals/Pain Pain Assessment Pain Assessment: Faces Faces Pain Scale: Hurts a little bit Pain Location: L knee with activity Pain Descriptors / Indicators: Sore Pain Intervention(s): Monitored  during session    Home Living                          Prior Function            PT Goals (current goals can now be found in the care plan section) Progress towards PT goals: Progressing toward goals    Frequency    Min 1X/week      PT Plan Current plan remains appropriate    Co-evaluation              AM-PAC PT "6 Clicks" Mobility   Outcome Measure  Help needed turning from your back to your side while in a flat bed without using bedrails?: A Little Help  needed moving from lying on your back to sitting on the side of a flat bed without using bedrails?: A Little Help needed moving to and from a bed to a chair (including a wheelchair)?: A Little Help needed standing up from a chair using your arms (e.g., wheelchair or bedside chair)?: A Little Help needed to walk in hospital room?: A Little Help needed climbing 3-5 steps with a railing? : A Lot 6 Click Score: 17    End of Session Equipment Utilized During Treatment: Gait belt Activity Tolerance: Patient tolerated treatment well Patient left: in bed;with call bell/phone within reach;with bed alarm set   PT Visit Diagnosis: History of falling (Z91.81);Other abnormalities of gait and mobility (R26.89);Difficulty in walking, not elsewhere classified (R26.2)     Time: 1030-1058 PT Time Calculation (min) (ACUTE ONLY): 28 min  Charges:  $Gait Training: 8-22 mins $Therapeutic Activity: 8-22 mins                         Faye Ramsay, PT Acute Rehabilitation  Office: [phone]

## 2023-03-27 NOTE — Progress Notes (Signed)
PROGRESS NOTE   Raymond Cuevas  DJS:[id number] DOB: 01-Sep-1934 DOA: 03/23/2023 PCP: Merri Brunette, MD  Brief Narrative:   89 year old community dwelling white male (lives alone)-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 bili 1.4, AST/ALT 129/35, magnesium 1.3 WBC 13.8  Admitted for AKI, Rhabdo and fall  6/7 MRI left lower extremity severe full-thickness cartilage loss and extensive degenerative tearing of posterior horn of medial meniscus especially at the posterior meniscal root?  Multiloculated paralabral cyst adjacent to the root of posterior horn with moderate knee effusion-tricompartmental spurring additionally 6/8 dr swinteck L knee aspirated 60 cc, 1 cc DepoMedrol administered 6/10 attempted to remove Foley but persistent retention and had to be replaced  Hospital-Problem based course  Accidental fall X-ray + CT of left knee concerning for arthritis with mild effusion-MRI shows extensive tearing of the muscles as dictated above Knee aspiration/ injection for ? Additional concern CPPD--expect pain will improve slowly hold colchicine or alternative  Severe AoS none TAVR candidate 2/2 dementia Not a candidate for TAVR--symptomatic management only --palliative care to see as an outpatient and to be arranged by PCP  Sinus bradycardia Constitutional--as no pauses, will d/c tele  Rhabdo + ensuant transaminitis  Saline lock 1 more check of LFTs in a.m.  LUTS BOOP AKI metabolic acidosis multiple electrolyte abnormalities--PTA losartan Hold ACE ARB  and thiazide in the outpatient setting forever Potassium normalized--stop replacement Attempted Foley discontinuation-retaining about 250 cc-had to be replaced-start Flomax 0.4 in addition to already ordered finasteride Overall kidney function is much improved  Mild intermittent asthma Stable-continue albuterol every 4 as needed  Elevated PSA squamous cell CA OP surveillance  Mild dementia/Presbycusis Not severe-able to orient to place, time,person With his hearing aids and can comprehend better   DVT prophylaxis: SCD Code Status: Full Family Communication: discussed with Darl Pikes, friend @ Bedside Disposition:  Status is: Observation The patient remains OBS appropriate and will d/c before 2 midnights.  Patient will need skilled placement   Subjective:  Fair, pain in left knee is slightly better but only minimally-has not been able to really move around No chest pain no fever Eating some Other than pain no real complaint I was informed by nursing that he failed to void after Foley discontinued  Objective: Vitals:   03/26/23 1252 03/26/23 2216 03/27/23 0352 03/27/23 1232  BP: (!) 141/55 (!) 128/57 (!) 117/57 (!) 129/54  Pulse: (!) 57 (!) 58 (!) 52 (!) 57  Resp: 16 20 16 16   Temp: 97.8 F (36.6 C) 98.2 F (36.8 C) 97.6 F (36.4 C) (!) 97.5 F (36.4 C)  TempSrc: Oral Oral Oral Oral  SpO2: 95% 96% 95% 96%  Weight:      Height:        Intake/Output Summary (Last 24 hours) at 03/27/2023 1725 Last data filed at 03/27/2023 1642 Gross per 24 hour  Intake 1654.2 ml  Output 2533 ml  Net -878.8 ml    Filed Weights   03/23/23 1543  Weight: 111 kg    Examination:   coherent no distress no icterus no pallor Chest clear no added sounds  holosystolic murmur  grade 4 best heard at [facility]--sinus brady  Abdomen soft no rebound no guarding Swollen left lower extremity-looks improved compared to prior  Data Reviewed: personally reviewed   CBC    Component Value Date/Time    WBC 9.4 03/24/2023 0530   RBC 3.40 (L) 03/24/2023 0530   HGB 11.0 (L) 03/24/2023 0530   HGB 15.5 11/30/2022 1505   HCT 33.4 (L) 03/24/2023 0530   HCT 46.2 11/30/2022 1505   PLT 283 03/24/2023 0530   PLT 375 11/30/2022 1505   MCV 98.2 03/24/2023 0530   MCV 95 11/30/2022 1505   MCH 32.4 03/24/2023 0530   MCHC 32.9 03/24/2023 0530   RDW 14.2 03/24/2023 0530   RDW 12.4 11/30/2022 1505   LYMPHSABS 0.7 03/23/2023 1623   MONOABS 1.9 (H) 03/23/2023 1623   EOSABS 0.0 03/23/2023 1623   BASOSABS 0.0 03/23/2023 1623      Latest Ref Rng & Units 03/27/2023    5:16 AM 03/26/2023    5:31 AM 03/24/2023    5:30 AM  CMP  Glucose 70 - 99 mg/dL 98  433  96   BUN 8 - 23 mg/dL 19  20  23    Creatinine 0.61 - 1.24 mg/dL 2.95  1.88  4.16   Sodium 135 - 145 mmol/L 140  139  137   Potassium 3.5 - 5.1 mmol/L 4.0  4.0  3.4   Chloride 98 - 111 mmol/L 109  110  109   CO2 22 - 32 mmol/L 23  23  19    Calcium 8.9 - 10.3 mg/dL 8.2  8.1  8.0   Total Protein 6.5 - 8.1 g/dL   5.3   Total Bilirubin 0.3 - 1.2 mg/dL   1.1   Alkaline Phos 38 - 126 U/L   27   AST 15 - 41 U/L   135   ALT 0 - 44 U/L   43      Radiology Studies: No results found.   Scheduled Meds:  Chlorhexidine Gluconate Cloth  6 each Topical Daily   finasteride  5 mg Oral Daily   levothyroxine  25 mcg Oral QAC breakfast   memantine  10 mg Oral BID   pantoprazole  40 mg Oral Daily   Ensure Max Protein  11 oz Oral Daily   tamsulosin  0.4 mg Oral QPC supper   Continuous Infusions:    LOS: 3 days   Time spent: 55  Rhetta Mura, MD [facility] To contact the attending provider between 7A-7P or the covering provider during after hours 7P-7A, please log into the web site [email or web address] and access using universal [place] password for that web site. If you do not have the password, please call the hospital operator.  03/27/2023, 5:25 PM

## 2023-03-28 ENCOUNTER — Other Ambulatory Visit (HOSPITAL_COMMUNITY): Payer: Self-pay

## 2023-03-28 DIAGNOSIS — M1712 Unilateral primary osteoarthritis, left knee: Secondary | ICD-10-CM | POA: Diagnosis not present

## 2023-03-28 DIAGNOSIS — Z7401 Bed confinement status: Secondary | ICD-10-CM | POA: Diagnosis not present

## 2023-03-28 DIAGNOSIS — M6282 Rhabdomyolysis: Secondary | ICD-10-CM | POA: Diagnosis not present

## 2023-03-28 DIAGNOSIS — Z79899 Other long term (current) drug therapy: Secondary | ICD-10-CM | POA: Diagnosis not present

## 2023-03-28 DIAGNOSIS — E039 Hypothyroidism, unspecified: Secondary | ICD-10-CM | POA: Diagnosis not present

## 2023-03-28 DIAGNOSIS — I959 Hypotension, unspecified: Secondary | ICD-10-CM | POA: Diagnosis not present

## 2023-03-28 DIAGNOSIS — N320 Bladder-neck obstruction: Secondary | ICD-10-CM | POA: Diagnosis not present

## 2023-03-28 DIAGNOSIS — N401 Enlarged prostate with lower urinary tract symptoms: Secondary | ICD-10-CM | POA: Diagnosis not present

## 2023-03-28 DIAGNOSIS — M25562 Pain in left knee: Secondary | ICD-10-CM | POA: Diagnosis not present

## 2023-03-28 DIAGNOSIS — R410 Disorientation, unspecified: Secondary | ICD-10-CM | POA: Diagnosis not present

## 2023-03-28 DIAGNOSIS — E440 Moderate protein-calorie malnutrition: Secondary | ICD-10-CM | POA: Diagnosis not present

## 2023-03-28 DIAGNOSIS — D72829 Elevated white blood cell count, unspecified: Secondary | ICD-10-CM | POA: Diagnosis not present

## 2023-03-28 DIAGNOSIS — E6609 Other obesity due to excess calories: Secondary | ICD-10-CM | POA: Diagnosis not present

## 2023-03-28 DIAGNOSIS — R531 Weakness: Secondary | ICD-10-CM | POA: Diagnosis not present

## 2023-03-28 DIAGNOSIS — E538 Deficiency of other specified B group vitamins: Secondary | ICD-10-CM | POA: Diagnosis not present

## 2023-03-28 DIAGNOSIS — Z7982 Long term (current) use of aspirin: Secondary | ICD-10-CM | POA: Diagnosis not present

## 2023-03-28 DIAGNOSIS — Z466 Encounter for fitting and adjustment of urinary device: Secondary | ICD-10-CM | POA: Diagnosis not present

## 2023-03-28 DIAGNOSIS — I350 Nonrheumatic aortic (valve) stenosis: Secondary | ICD-10-CM | POA: Diagnosis not present

## 2023-03-28 DIAGNOSIS — E860 Dehydration: Secondary | ICD-10-CM | POA: Diagnosis not present

## 2023-03-28 DIAGNOSIS — S51011D Laceration without foreign body of right elbow, subsequent encounter: Secondary | ICD-10-CM | POA: Diagnosis not present

## 2023-03-28 DIAGNOSIS — E46 Unspecified protein-calorie malnutrition: Secondary | ICD-10-CM | POA: Diagnosis not present

## 2023-03-28 DIAGNOSIS — R338 Other retention of urine: Secondary | ICD-10-CM | POA: Diagnosis not present

## 2023-03-28 DIAGNOSIS — W19XXXD Unspecified fall, subsequent encounter: Secondary | ICD-10-CM | POA: Diagnosis not present

## 2023-03-28 DIAGNOSIS — H9193 Unspecified hearing loss, bilateral: Secondary | ICD-10-CM | POA: Diagnosis not present

## 2023-03-28 DIAGNOSIS — F0390 Unspecified dementia without behavioral disturbance: Secondary | ICD-10-CM | POA: Diagnosis not present

## 2023-03-28 DIAGNOSIS — R062 Wheezing: Secondary | ICD-10-CM | POA: Diagnosis not present

## 2023-03-28 DIAGNOSIS — E559 Vitamin D deficiency, unspecified: Secondary | ICD-10-CM | POA: Diagnosis not present

## 2023-03-28 DIAGNOSIS — N179 Acute kidney failure, unspecified: Secondary | ICD-10-CM | POA: Diagnosis not present

## 2023-03-28 DIAGNOSIS — K219 Gastro-esophageal reflux disease without esophagitis: Secondary | ICD-10-CM | POA: Diagnosis not present

## 2023-03-28 LAB — COMPREHENSIVE METABOLIC PANEL
ALT: 38 U/L (ref 0–44)
AST: 48 U/L — ABNORMAL HIGH (ref 15–41)
Albumin: 2.6 g/dL — ABNORMAL LOW (ref 3.5–5.0)
Alkaline Phosphatase: 38 U/L (ref 38–126)
Anion gap: 8 (ref 5–15)
BUN: 18 mg/dL (ref 8–23)
CO2: 25 mmol/L (ref 22–32)
Calcium: 8.7 mg/dL — ABNORMAL LOW (ref 8.9–10.3)
Chloride: 107 mmol/L (ref 98–111)
Creatinine, Ser: 0.89 mg/dL (ref 0.61–1.24)
GFR, Estimated: >60 mL/min (ref >60)
Glucose, Bld: 107 mg/dL — ABNORMAL HIGH (ref 70–99)
Potassium: 3.7 mmol/L (ref 3.5–5.1)
Sodium: 140 mmol/L (ref 135–145)
Total Bilirubin: 0.7 mg/dL (ref 0.3–1.2)
Total Protein: 5.5 g/dL — ABNORMAL LOW (ref 6.5–8.1)

## 2023-03-28 LAB — CK: Total CK: 189 U/L (ref 49–397)

## 2023-03-28 MED ORDER — TAMSULOSIN HCL 0.4 MG PO CAPS
0.4000 mg | ORAL_CAPSULE | Freq: Every day | ORAL | 0 refills | Status: AC
  Filled 2023-03-28: qty 30, 30d supply, fill #0

## 2023-03-28 MED ORDER — FINASTERIDE 5 MG PO TABS: 5.0000 mg | ORAL_TABLET | Freq: Every day | ORAL | 0 refills | Status: AC

## 2023-03-28 MED ORDER — HYDROCODONE-ACETAMINOPHEN 5-325 MG PO TABS: 1.0000 | ORAL_TABLET | ORAL | 0 refills | Status: AC | PRN

## 2023-03-28 NOTE — TOC Transition Note (Signed)
Transition of Care [facility]) - CM/SW Discharge Note   Patient Details  Name: KALON ERHARDT MRN: [id number] Date of Birth: Mar 09, 1934  [facility] CM/SW Contact:  Coralyn Helling, LCSW Phone Number: 03/28/2023, 11:27 AM   Clinical Narrative:   Patient to dc to [facility] via [facility]. Per patient no family to notify.   RN report#:  [phone] Room # 204    Final next level of care: Skilled Nursing Facility Barriers to Discharge: No Barriers Identified   Patient Goals and CMS Choice CMS Medicare.gov Compare Post Acute Care list provided to:: Patient Choice offered to / list presented to : Patient  Discharge Placement                Patient chooses bed at: [facility] [place] Patient to be transferred to facility by: [facility] Name of family member notified: Patient declined    Discharge Plan and Services Additional resources added to the After Visit Summary for                  DME Arranged: N/A DME Agency: NA       HH Arranged: NA HH Agency: NA        Social Determinants of Health (SDOH) Interventions SDOH Screenings   Food Insecurity: No Food Insecurity (03/23/2023)  Housing: Low Risk  (03/23/2023)  Transportation Needs: No Transportation Needs (03/23/2023)  Utilities: Not At Risk (03/23/2023)  Tobacco Use: Low Risk  (03/23/2023)     Readmission Risk Interventions     No data to display

## 2023-03-28 NOTE — Progress Notes (Signed)
Mobility Specialist - Progress Note   03/28/23 1057  Mobility  Activity Transferred from bed to chair  Level of Assistance Standby assist, set-up cues, supervision of patient - no hands on  Assistive Device Front wheel walker  Range of Motion/Exercises Active  Activity Response Tolerated well  Mobility Referral Yes  $Mobility charge 1 Mobility  Mobility Specialist Start Time (ACUTE ONLY) 1047  Mobility Specialist Stop Time (ACUTE ONLY) 1057  Mobility Specialist Time Calculation (min) (ACUTE ONLY) 10 min   Pt was found in bed. Refused ambulation due to L knee pain. Agreeable to transfer to recliner chair. Was left with call bell in hand and chair alarm on.  Billey Chang Mobility Specialist

## 2023-03-28 NOTE — Plan of Care (Signed)
  Problem: Education: Goal: Knowledge of General Education information will improve Description: Including pain rating scale, medication(s)/side effects and non-pharmacologic comfort measures 03/28/2023 1255 by Brandon Melnick, RN Outcome: Completed/Met 03/28/2023 1043 by Brandon Melnick, RN Outcome: Progressing   Problem: Health Behavior/Discharge Planning: Goal: Ability to manage health-related needs will improve Outcome: Completed/Met   Problem: Clinical Measurements: Goal: Ability to maintain clinical measurements within normal limits will improve Outcome: Completed/Met Goal: Will remain free from infection Outcome: Completed/Met Goal: Diagnostic test results will improve Outcome: Completed/Met Goal: Respiratory complications will improve Outcome: Completed/Met Goal: Cardiovascular complication will be avoided Outcome: Completed/Met   Problem: Activity: Goal: Risk for activity intolerance will decrease Outcome: Completed/Met   Problem: Nutrition: Goal: Adequate nutrition will be maintained Outcome: Completed/Met   Problem: Coping: Goal: Level of anxiety will decrease 03/28/2023 1255 by Brandon Melnick, RN Outcome: Completed/Met 03/28/2023 1043 by Brandon Melnick, RN Outcome: Progressing   Problem: Elimination: Goal: Will not experience complications related to bowel motility Outcome: Completed/Met Goal: Will not experience complications related to urinary retention 03/28/2023 1255 by Brandon Melnick, RN Outcome: Completed/Met 03/28/2023 1043 by Brandon Melnick, RN Outcome: Progressing   Problem: Pain Managment: Goal: General experience of comfort will improve 03/28/2023 1255 by Brandon Melnick, RN Outcome: Completed/Met 03/28/2023 1043 by Brandon Melnick, RN Outcome: Progressing   Problem: Safety: Goal: Ability to remain free from injury will improve 03/28/2023 1255 by Brandon Melnick, RN Outcome: Completed/Met 03/28/2023 1043  by Brandon Melnick, RN Outcome: Progressing   Problem: Skin Integrity: Goal: Risk for impaired skin integrity will decrease 03/28/2023 1255 by Brandon Melnick, RN Outcome: Completed/Met 03/28/2023 1043 by Brandon Melnick, RN Outcome: Progressing

## 2023-03-28 NOTE — Plan of Care (Signed)

## 2023-03-28 NOTE — Progress Notes (Signed)
Patient received discharge orders to go to [facility] [place]. RN called report over to receiving facility ([facility] [place]) about patient. [facility] was called. Another RN assisted the primary RN with going over the discharge paperwork/instructions with the patient. Pharmacy came to the floor and brought patient's discharge medications to the patient.  [facility] came to pick patient up to take patient to [facility] [place]. Patient left the hospital stable with Foley catheter per discharge orders, had discharge paperwork/instructions, and had all personal belongings. RN notified patient's friend, Narda Rutherford, that patient had been picked up by [facility] to take patient to [facility] [place]. RN tried to get in touch with patient's daughter, Loistine Simas, about patient going to [facility] in [place] but no answer. RN tried to get in touch with patient's son, Montavius Subramaniam, about patient going to [facility] in [place] but no answer. At 1830, patient's son, Emiel Kielty, called RN back and RN updated son that [facility] had picked patient up and was taking patient to [facility] in [place].  Prior to [facility] picking patient up, RN notified TOC CM/SW that patient's friend, Narda Rutherford, made RN aware via 4th floor secretary that she did not want patient to go to [facility] in [place]. This was after discharge orders were placed, report was given to the receiving facility, and [facility] was called. TOC CM/SW notified RN that patient's friend was consulted about the situation.

## 2023-03-28 NOTE — Discharge Summary (Signed)
Physician Discharge Summary  JARYD LAMMERT ZOX:[id number] DOB: 07-04-1934 DOA: 03/23/2023  PCP: Merri Brunette, MD  Admit date: 03/23/2023 Discharge date: 03/28/2023  Time spent: 37 minutes  Recommendations for Outpatient Follow-up:  Requires Chem-7 CBC in 1 week Requires TSH in 3 weeks-apparently was not taking his thyroxine as an outpatient Requires medication management after discharge from facility-would simplify meds as he is simply noncompliant on several Recommend therapy services daily and graduated pain control Please refer the patient prior to discharge from facility to [facility] for catheter management as he will be discharging on the catheter as he cannot be liberated from it Do not use thiazides or ACE ARB in the outpatient setting please as this can cause volume depletion Consider palliative referral  Discharge Diagnoses:  MAIN problem for hospitalization   Accidental fall at home Traumatic rhabdomyolysis in the setting of fall AKI and transaminitis from rhabdo Noninfectious leukocytosis Acute urinary retention   Please see below for itemized issues addressed in HOpsital- refer to other progress notes for clarity if needed  Discharge Condition: Improved  Diet recommendation: Heart healthy  Filed Weights   03/23/23 1543  Weight: 111 kg    History of present illness:  89 year old community dwelling white male (lives alone)-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 bili 1.4, AST/ALT 129/35, magnesium 1.3 WBC 13.8   Admitted  for AKI, Rhabdo and fall   6/7 MRI left lower extremity severe full-thickness cartilage loss and extensive degenerative tearing of posterior horn of medial meniscus especially at the posterior meniscal root?  Multiloculated paralabral cyst adjacent to the root of posterior horn with moderate knee effusion-tricompartmental spurring additionally 6/8 dr swinteck L knee aspirated 60 cc, 1 cc DepoMedrol administered 6/10 attempted to remove Foley but persistent retention and had to be replaced  Hospital Course:  Accidental fall X-ray + CT of left knee concerning for arthritis with mild effusion-MRI shows extensive tearing of the muscles as dictated above Knee aspiration/ injection for ? Additional concern CPPD--patient was prescribed topical pain control in addition to Percocet on discharge and will need graduated therapy in addition to mobility exercises at skilled facility when he goes a hold colchicine or alternative at this time  Left knee aspiration Cultures that were performed did not really show any growth and this is not an infectious   Severe AoS none TAVR candidate 2/2 dementia Not a candidate for TAVR--symptomatic management only --palliative care to see as an outpatient and to be arranged by PCP   Sinus bradycardia Constitutional--as no pauses, will d/c tele   Rhabdo + ensuant transaminitis  Saline lock Rhabdo less than 500 LFTs trending downwards--- would not use any further statin as he seems to not take a lot of medications anyway and may have a preference for less meds-to be discussed as an outpatient with his primary physician   LUTS BOOP AKI metabolic acidosis multiple electrolyte abnormalities--PTA losartan Hold ACE ARB and thiazide in the outpatient setting forever Potassium normalized--stop replacement Attempted Foley discontinuation-retaining about 250 cc-had to be replaced-start Flomax 0.4 in addition to already ordered finasteride and will need outpatient  [facility] for further management Overall kidney function is much improved   Mild intermittent asthma Stable-continue albuterol every 4 as needed--does not typically use Symbicort so discontinued   Elevated PSA squamous cell CA OP surveillance   Mild dementia/Presbycusis Not severe-able to orient to place, time,person With his hearing aids and can comprehend better   Discharge Exam: Vitals:   03/28/23 0006 03/28/23 0440  BP: (!) 126/56 (!) 130/56  Pulse: 64 (!) 59  Resp: 12 18  Temp: 98 F (36.7 C) 97.8 F (36.6 C)  SpO2: 96% 94%    Subj on day of d/c   Awake coherent no distress looks comfortable Complaining mainly of left knee pain Overall stable otherwise  General Exam on discharge  EOMI NCAT no focal deficit no icterus no pallor no rales Eliquis unless present Neck soft supple ROM intact CTAB no added sound Neuro intact moving all limbs equally other than left knee which is antalgic and swollen  Discharge Instructions   Discharge Instructions     Continue foley catheter   Complete by: As directed    Cotinue foley at time d/c   Diet - low sodium heart healthy   Complete by: As directed    Increase activity slowly   Complete by: As directed    No wound care   Complete by: As directed       Allergies as of 03/28/2023       Reactions   Codeine Other (See Comments)   "Walking the walls"   Morphine And Codeine Other (See Comments)   "Walking the walls"   Sulfa Antibiotics Rash        Medication List     STOP taking these medications    ipratropium-albuterol 0.5-2.5 (3) MG/3ML Soln Commonly known as: DUONEB   losartan 25 MG tablet Commonly known as: COZAAR   memantine 10 MG tablet Commonly known as: Namenda   rosuvastatin 10 MG tablet Commonly known as: CRESTOR   Symbicort 160-4.5 MCG/ACT inhaler Generic drug: budesonide-formoterol       TAKE these medications    albuterol 108 (90 Base) MCG/ACT  inhaler Commonly known as: VENTOLIN HFA INHALE 2 PUFFS BY MOUTH AS NEEDED 4 TIMES DAILY UNTIL WHEEZING EXTINGUISHED. RINSE AND SPIT AFTER USE. DEMONSTRATE PROPER TECHNIQUE INHALATI What changed:  how much to take how to take this when to take this reasons to take this additional instructions   aspirin EC 81 MG tablet Take 81 mg by mouth daily.   Calcium Carb-Cholecalciferol 600-20 MG-MCG Tabs Take 1 tablet by mouth daily.   cyanocobalamin 1000 MCG tablet Commonly known as: VITAMIN B12 Take 1,000 mcg by mouth daily.   finasteride 5 MG tablet Commonly known as: PROSCAR Take 1 tablet (5 mg total) by mouth daily. Start taking on: March 29, 2023   HYDROcodone-acetaminophen 5-325 MG tablet Commonly known as: NORCO/VICODIN Take 1-2 tablets by mouth every 4 (four) hours as needed for moderate pain.   levothyroxine 25 MCG tablet Commonly known as: SYNTHROID Take 25 mcg by mouth daily before breakfast.   magnesium hydroxide 400 MG/5ML suspension Commonly known as: MILK OF MAGNESIA Take 30 mLs by mouth daily as needed for moderate constipation or heartburn.   omeprazole 20 MG tablet Commonly known as: PRILOSEC OTC Take 20 mg by mouth daily.   tamsulosin 0.4 MG Caps capsule Commonly known as: FLOMAX Take 1 capsule (0.4 mg total) by mouth daily after supper.   TYLENOL 500 MG tablet  Generic drug: acetaminophen Take 500-1,000 mg by mouth every 6 (six) hours as needed for mild pain or headache.   vitamin C 1000 MG tablet Take 1,000 mg by mouth daily.       Allergies  Allergen Reactions   Codeine Other (See Comments)    "Walking the walls"   Morphine And Codeine Other (See Comments)    "Walking the walls"   Sulfa Antibiotics Rash      The results of significant diagnostics from this hospitalization (including imaging, microbiology, ancillary and laboratory) are listed below for reference.    Significant Diagnostic Studies: MR KNEE LEFT WO CONTRAST  Result Date:  03/25/2023 CLINICAL DATA:  Knee trauma, left knee pain EXAM: MRI OF THE LEFT KNEE WITHOUT CONTRAST TECHNIQUE: Multiplanar, multisequence MR imaging of the knee was performed. No intravenous contrast was administered. COMPARISON:  CT left knee 03/23/2023 and radiographs from the same day. FINDINGS: MENISCI Medial meniscus: Extensive degenerative tearing of the posterior horn with amorphous appearance especially at the posterior meniscal root. The abnormal degenerative signal extends into the midbody. The patient has known chondrocalcinosis but the severity is highly disproportionate to the calcification in the meniscus. Suspected posterior multiloculated paralabral cyst measuring 1.5 by 1.2 by 1.4 cm adjacent to the root of the posterior horn, extending beyond the joint capsule margin on image 20 series 8. Lateral meniscus: Grade 1 signal with some mild fraying the free edge of the midbody but without a well-defined tear. LIGAMENTS Cruciates: Expansion and increased signal in the distal PCL probably from degeneration, less likely sprain, image 15 series 13. ACL intact. Collaterals:  Intact CARTILAGE Patellofemoral: Severe full-thickness cartilage loss laterally in the patellofemoral joint. Marginal spurring. Medial: Moderate to prominent degenerative chondral loss especially along the medial femoral condyle. Marginal spurring. Lateral:  Mild chondral thinning laterally.  Marginal spurring. Joint: Moderate knee effusion. Irregularity along Hoffa's have fat pad posteriorly example on image 17 series 12 suggesting synovitis. Popliteal Fossa: In addition to the suspected paralabral cyst noted above, there is diffuse infiltrative edema in the popliteal space as well as a small Baker's cyst. Extensor Mechanism:  Mild distal patellar tendinopathy. Bones: Posterior geode along the tibial spine extends into the posterolateral portion of the medial tibial plateau measures about 1.6 cm. Mild surrounding marrow edema. Other:  Subcutaneous edema along the knee especially laterally. Substantial infiltrative edema surrounding the suprapatellar bursa and in the popliteal space. IMPRESSION: 1. Extensive degenerative tearing of the posterior horn medial meniscus especially at the posterior meniscal root. Suspected posterior multiloculated paralabral cyst adjacent to the root of the posterior horn, extending beyond the joint capsule margin. 2. Moderate knee effusion with suspected synovitis along Hoffa's fat pad posteriorly. 3. Substantial infiltrative edema in the popliteal space and surrounding the suprapatellar bursa. 4. Mild distal patellar tendinopathy. 5. Expansion and increased signal in the distal PCL probably from degeneration, less likely sprain. 6. Posterior geode along the tibial spine extends into the posterolateral portion of the medial tibial plateau. 7. Severe full-thickness chondral thinning laterally in the patellofemoral joint moderate chondral thinning in the medial compartment and mild chondral thinning in the lateral compartment. Tricompartmental spurring. Electronically Signed   By: Gaylyn Rong M.D.   On: 03/25/2023 10:23   DG Abd 1 View  Result Date: 03/23/2023 CLINICAL DATA:  Abdominal pain and constipation EXAM: ABDOMEN - 1 VIEW COMPARISON:  None Available. FINDINGS: Relative paucity of bowel gas is noted. No obstructive changes are seen. No free air is noted. Degenerative changes of lumbar  spine are seen. No significant retained fecal material is noted. Right hip replacement is seen. IMPRESSION: No significant changes of constipation. Electronically Signed   By: Alcide Clever M.D.   On: 03/23/2023 20:07   CT Knee Left Wo Contrast  Result Date: 03/23/2023 CLINICAL DATA:  Weakness.  Knee trauma.  Fall EXAM: CT OF THE LEFT KNEE WITHOUT CONTRAST TECHNIQUE: Multidetector CT imaging of the left knee was performed according to the standard protocol. Multiplanar CT image reconstructions were also generated.  RADIATION DOSE REDUCTION: This exam was performed according to the departmental dose-optimization program which includes automated exposure control, adjustment of the mA and/or kV according to patient size and/or use of iterative reconstruction technique. COMPARISON:  X-ray earlier 03/23/2023. FINDINGS: Bones/Joint/Cartilage No fracture identified. No dislocation. As seen on x-ray there are osteophytes seen of all 3 compartments, greatest of the lateral compartment. There is peaking of the tibial spines. Mild degenerative changes as well along the proximal tibiofibular joint some sclerosis and small osteophytes. Subchondral cyst formation identified along the posterior aspect of the medial tibial plateau towards the midline. Few other areas as well along the tibial plateau of both mediolateral compartments. Ligaments Suboptimally assessed by CT. Muscles and Tendons Areas of fatty muscle atrophy diffusely. Preserved patellar and quadriceps tendon. Soft tissues Chondrocalcinosis. Small joint effusion. Vascular calcifications are seen about the knee. Mild subcutaneous edema anteriorly. IMPRESSION: Tricompartmental degenerative changes. Chondrocalcinosis. Small joint effusion. Overall if there is further concern of internal derangement dedicated MRI imaging can be considered if the patient is clinically compatible and when clinically appropriate Electronically Signed   By: Karen Kays M.D.   On: 03/23/2023 18:06   DG Chest Port 1 View  Result Date: 03/23/2023 CLINICAL DATA:  Weakness. EXAM: PORTABLE CHEST 1 VIEW COMPARISON:  Chest radiograph 12/27/2022. FINDINGS: Low lung volumes accentuate the pulmonary vasculature and cardiomediastinal silhouette. No consolidation or pulmonary edema. No pleural effusion or pneumothorax. Visualized bones and upper abdomen are unremarkable. IMPRESSION: No evidence of acute cardiopulmonary disease. Electronically Signed   By: Orvan Falconer M.D.   On: 03/23/2023 16:53   DG Knee  Complete 4 Views Left  Result Date: 03/23/2023 CLINICAL DATA:  Weakness. EXAM: LEFT KNEE - COMPLETE 4+ VIEW COMPARISON:  Left knee radiographs 09/16/2019. FINDINGS: Four views of the left knee. No acute fracture or dislocation. Possible small knee joint effusion. 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 Atherosclerotic vascular changes in the surrounding soft tissues. IMPRESSION: 1. No acute fracture or dislocation of the left knee. 2. 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 Electronically Signed   By: Orvan Falconer M.D.   On: 03/23/2023 16:44   CT Head Wo Contrast  Result Date: 03/23/2023 CLINICAL DATA:  Head trauma, minor (Age >= 65y); Neck trauma (Age >= 65y). Fall. EXAM: CT HEAD WITHOUT CONTRAST CT CERVICAL SPINE WITHOUT CONTRAST TECHNIQUE: Multidetector CT imaging of the head and cervical spine was performed following the standard protocol without intravenous contrast. Multiplanar CT image reconstructions of the cervical spine were also generated. RADIATION DOSE REDUCTION: This exam was performed according to the departmental dose-optimization program which includes automated exposure control, adjustment of the mA and/or kV according to patient size and/or use of iterative reconstruction technique. COMPARISON:  Head CT 05/12/2022. FINDINGS: CT HEAD FINDINGS Brain: No acute intracranial hemorrhage. Gray-white differentiation is preserved. No hydrocephalus or extra-axial collection. No mass effect or midline shift. Vascular: No hyperdense vessel or unexpected calcification. Skull: No calvarial fracture or suspicious bone lesion. Skull base is unremarkable. Sinuses/Orbits:  Unremarkable. Other: Retained metallic fragment superior to the left ear. CT CERVICAL SPINE FINDINGS Alignment: Trace degenerative retrolisthesis of C3 on C4. Skull base and vertebrae: No acute fracture. Normal craniocervical junction. No suspicious bone lesions. Soft  tissues and spinal canal: No prevertebral fluid or swelling. No visible canal hematoma. Disc levels: Mild cervical spondylosis without high-grade spinal canal stenosis. Upper chest: Unremarkable. Other: None. IMPRESSION: 1. No acute intracranial abnormality. 2. No acute cervical spine fracture or traumatic malalignment. Electronically Signed   By: Orvan Falconer M.D.   On: 03/23/2023 16:34   CT Cervical Spine Wo Contrast  Result Date: 03/23/2023 CLINICAL DATA:  Head trauma, minor (Age >= 65y); Neck trauma (Age >= 65y). Fall. EXAM: CT HEAD WITHOUT CONTRAST CT CERVICAL SPINE WITHOUT CONTRAST TECHNIQUE: Multidetector CT imaging of the head and cervical spine was performed following the standard protocol without intravenous contrast. Multiplanar CT image reconstructions of the cervical spine were also generated. RADIATION DOSE REDUCTION: This exam was performed according to the departmental dose-optimization program which includes automated exposure control, adjustment of the mA and/or kV according to patient size and/or use of iterative reconstruction technique. COMPARISON:  Head CT 05/12/2022. FINDINGS: CT HEAD FINDINGS Brain: No acute intracranial hemorrhage. Gray-white differentiation is preserved. No hydrocephalus or extra-axial collection. No mass effect or midline shift. Vascular: No hyperdense vessel or unexpected calcification. Skull: No calvarial fracture or suspicious bone lesion. Skull base is unremarkable. Sinuses/Orbits: Unremarkable. Other: Retained metallic fragment superior to the left ear. CT CERVICAL SPINE FINDINGS Alignment: Trace degenerative retrolisthesis of C3 on C4. Skull base and vertebrae: No acute fracture. Normal craniocervical junction. No suspicious bone lesions. Soft tissues and spinal canal: No prevertebral fluid or swelling. No visible canal hematoma. Disc levels: Mild cervical spondylosis without high-grade spinal canal stenosis. Upper chest: Unremarkable. Other: None. IMPRESSION:  1. No acute intracranial abnormality. 2. No acute cervical spine fracture or traumatic malalignment. Electronically Signed   By: Orvan Falconer M.D.   On: 03/23/2023 16:34    Microbiology: Recent Results (from the past 240 hour(s))  Body fluid culture w Gram Stain     Status: None (Preliminary result)   Collection Time: 03/25/23  1:12 PM   Specimen: Synovium; Body Fluid  Result Value Ref Range Status   Specimen Description   Final    SYNOVIAL Performed at [facility], [address] [place], [place] [address]    Special Requests LEFT, KNEE  Final   Gram Stain   Final    ABUNDANT WBC PRESENT, PREDOMINANTLY PMN NO ORGANISMS SEEN    Culture   Final    NO GROWTH 3 DAYS Performed at [facility], [address], [place], [place] [address]    Report Status PENDING  Incomplete     Labs: Basic Metabolic Panel: Recent Labs  Lab 03/23/23 1623 03/23/23 1930 03/24/23 0530 03/26/23 0531 03/27/23 0516 03/28/23 0501  NA 140 137 137 139 140 140  K 2.3* 3.5 3.4* 4.0 4.0 3.7  CL 120* 107 109 110 109 107  CO2 14* 21* 19* 23 23 25   GLUCOSE 77 112* 96 103* 98 107*  BUN 21 27* 23 20 19 18   CREATININE 0.76 1.35* 0.98 0.90 0.88 0.89  CALCIUM 5.1* 7.6* 8.0* 8.1* 8.2* 8.7*  MG 1.3* 2.3 2.2  --   --   --   PHOS  --  3.4 3.0  --   --   --    Liver Function Tests: Recent Labs  Lab 03/23/23 1623  03/23/23 1930 03/24/23 0530 03/28/23 0501  AST 129* 171* 135* 48*  ALT 35 49* 43 38  ALKPHOS 23* 33* 27* 38  BILITOT 1.4* 1.5* 1.1 0.7  PROT 3.9* 5.8* 5.3* 5.5*  ALBUMIN 2.0* 2.7* 2.6* 2.6*   No results for input(s): "LIPASE", "AMYLASE" in the last 168 hours. Recent Labs  Lab 03/23/23 1930  AMMONIA 11   CBC: Recent Labs  Lab 03/23/23 1623 03/24/23 0530  WBC 13.8* 9.4  NEUTROABS 11.1*  --   HGB 12.8* 11.0*  HCT 38.8* 33.4*  MCV 97.2 98.2  PLT 346 283   Cardiac Enzymes: Recent Labs  Lab 03/23/23 1623 03/23/23 1930 03/24/23 0530 03/28/23 0501   CKTOTAL 2,990* 3,548* 2,604* 189   BNP: BNP (last 3 results) Recent Labs    12/27/22 1039  BNP 136.3*    ProBNP (last 3 results) No results for input(s): "PROBNP" in the last 8760 hours.  CBG: No results for input(s): "GLUCAP" in the last 168 hours.     Signed:  Rhetta Mura MD   [facility] 03/28/2023, 12:10 PM

## 2023-03-28 NOTE — TOC Transition Note (Addendum)
Transition of Care [facility]) - CM/SW Discharge Note   Patient Details  Name: Raymond Cuevas MRN: [id number] Date of Birth: 1934/04/25  Transition of [facility] CM/SW Contact:  Lavenia Atlas, RN Phone Number: 03/28/2023, 5:14 PM   Clinical Narrative:   This RNCM received call from RN who reports she received a message from Darl Pikes patient's friend reporting patient is not to go to [facility] and [facility] has been called. This RNCM left a voicemail for Darl Pikes for a call back, awaiting call back.  -5:19pm This RNCM left voicemail for Tracey w/[facility], awaiting a call back.   - 5:33pm This RNCM spoke with patient's friend Darl Pikes who reports she spoke with TOC SW and advised [facility] is her preference. Per chart review on 03/27/23 TOC SW advised patient's friend Darl Pikes that [facility] did not have any available beds. [facility] has been called and report was provided to [facility].     TOC will continue to follow.     Final next level of care: Skilled Nursing Facility Barriers to Discharge: No Barriers Identified   Patient Goals and CMS Choice CMS Medicare.gov Compare Post Acute Care list provided to:: Patient Choice offered to / list presented to : Patient  Discharge Placement                Patient chooses bed at: [facility] [place] Patient to be transferred to facility by: [facility] Name of family member notified: Patient declined    Discharge Plan and Services Additional resources added to the After Visit Summary for                  DME Arranged: N/A DME Agency: NA       HH Arranged: NA HH Agency: NA        Social Determinants of Health (SDOH) Interventions SDOH Screenings   Food Insecurity: No Food Insecurity (03/23/2023)  Housing: Low Risk  (03/23/2023)  Transportation Needs: No Transportation Needs (03/23/2023)  Utilities: Not At Risk (03/23/2023)  Tobacco Use: Low Risk  (03/23/2023)     Readmission Risk Interventions     No data to display

## 2023-03-29 ENCOUNTER — Telehealth: Payer: Self-pay

## 2023-03-29 LAB — BODY FLUID CULTURE W GRAM STAIN

## 2023-03-29 NOTE — Patient Instructions (Signed)
Visit Information  Thank you for taking time to visit with me today. Please don't hesitate to contact me if I can be of assistance to you.   Following are the goals we discussed today:   Goals Addressed             This Visit's Progress    Medication Adherence and help with meals       Patient Goals/Self Care Activities: -Patient/Caregiver will self-administer medications as prescribed as evidenced by self-report/primary caregiver report  -Patient/Caregiver will attend all scheduled provider appointments as evidenced by clinician review of documented attendance to scheduled appointments and patient/caregiver report  Food insecurities Patient reports he has meals in the freezer that he warms up.     Discussed with Velna Hatchet.  Patient discharged to [facility] on yesterday.  She states that stepdaughter that patient has not seen in over a year has stepped in making patient decisions.  Patient does not have a legal POA.  Advised that patient needs to have HCPOA done. She will discuss with patient and facility.           Our next appointment is by telephone on 04/03/23 at 1pm  Please call the care guide team at [phone] if you need to cancel or reschedule your appointment.   If you are experiencing a Mental Health or Behavioral Health Crisis or need someone to talk to, please call the Suicide and Crisis Lifeline: 988   Patient verbalizes understanding of instructions and care plan provided today and agrees to view in MyChart. Active MyChart status and patient understanding of how to access instructions and care plan via MyChart confirmed with patient.     The patient has been provided with contact information for the care management team and has been advised to call with any health related questions or concerns.   Bary Leriche, RN, MSN [facility] Care Management Care Management Coordinator Direct Line [phone]

## 2023-03-29 NOTE — Patient Outreach (Signed)
  Care Coordination   Follow Up Visit Note   03/29/2023 Name: Raymond Cuevas MRN: [id number] DOB: 01-24-1934  Raymond Cuevas is a 89 y.o. year old male who sees Merri Brunette, MD for primary care. I  spoke with Raymond Cuevas by phone  today.    What matters to the patients health and wellness today?  Rehab status presently    Goals Addressed             This Visit's Progress    Medication Adherence and help with meals       Patient Goals/Self Care Activities: -Patient/Caregiver will self-administer medications as prescribed as evidenced by self-report/primary caregiver report  -Patient/Caregiver will attend all scheduled provider appointments as evidenced by clinician review of documented attendance to scheduled appointments and patient/caregiver report  Food insecurities Patient reports he has meals in the freezer that he warms up.     Discussed with Velna Hatchet.  Patient discharged to [facility] on yesterday.  She states that stepdaughter that patient has not seen in over a year has stepped in making patient decisions.  Patient does not have a legal POA.  Advised that patient needs to have HCPOA done. She will discuss with patient and facility.           SDOH assessments and interventions completed:  Yes     Care Coordination Interventions:  Yes, provided   Follow up plan: Follow up call scheduled for 04/03/23    Encounter Outcome:  Pt. Visit Completed   Bary Leriche, RN, MSN [facility] Care Management Care Management Coordinator Direct Line [phone]

## 2023-03-30 ENCOUNTER — Other Ambulatory Visit: Payer: Self-pay | Admitting: *Deleted

## 2023-03-30 NOTE — Patient Outreach (Signed)
Per [facility], Mr. Raymond Cuevas resides in [facility] [place]. Per chart review, Mr. Raymond Cuevas is active with [facility] care coordination team.  Secure communication sent to [facility] discharge planner to make aware writer is following for transition plans/needs.  Will keep [facility] RN care coordinator updated.  Raiford Noble, MSN, RN,BSN [facility] Post Acute Care Coordinator [phone] (Direct dial)

## 2023-03-31 ENCOUNTER — Other Ambulatory Visit: Payer: Self-pay | Admitting: *Deleted

## 2023-03-31 ENCOUNTER — Non-Acute Institutional Stay: Payer: Medicare Other | Admitting: Hospice

## 2023-03-31 DIAGNOSIS — E46 Unspecified protein-calorie malnutrition: Secondary | ICD-10-CM | POA: Diagnosis not present

## 2023-03-31 DIAGNOSIS — Z515 Encounter for palliative care: Secondary | ICD-10-CM

## 2023-03-31 DIAGNOSIS — E440 Moderate protein-calorie malnutrition: Secondary | ICD-10-CM

## 2023-03-31 DIAGNOSIS — I350 Nonrheumatic aortic (valve) stenosis: Secondary | ICD-10-CM | POA: Diagnosis not present

## 2023-03-31 DIAGNOSIS — M25562 Pain in left knee: Secondary | ICD-10-CM | POA: Diagnosis not present

## 2023-03-31 DIAGNOSIS — G8929 Other chronic pain: Secondary | ICD-10-CM

## 2023-03-31 DIAGNOSIS — M6282 Rhabdomyolysis: Secondary | ICD-10-CM | POA: Diagnosis not present

## 2023-03-31 DIAGNOSIS — N320 Bladder-neck obstruction: Secondary | ICD-10-CM | POA: Diagnosis not present

## 2023-03-31 DIAGNOSIS — F0390 Unspecified dementia without behavioral disturbance: Secondary | ICD-10-CM

## 2023-03-31 DIAGNOSIS — W19XXXD Unspecified fall, subsequent encounter: Secondary | ICD-10-CM

## 2023-03-31 NOTE — Progress Notes (Signed)
Therapist, nutritional Palliative Care Consult Note Telephone: [phone]  Fax: [phone]  PATIENT NAME: Raymond Cuevas [address] [place] [address] [phone] (home) [phone] (work) DOB: October 30, 1933 MRN: [id number]  PRIMARY CARE PROVIDER:    Merri Brunette, MD,  [address] [place] [address] [phone]  REFERRING PROVIDER:   Dr. Jarome Matin  RESPONSIBLE PARTY:    Marcelino Duster is patient's step daughter.  Contact Information     Name Relation Home Work Mobile   Reid,Michelle Daughter   [phone]   Alexsis, Maurer   [phone]   [facility] Daughter   [phone]   Narda Rutherford Other [phone]  [phone]   Celestia Khat   [phone]        I met face to face with patient in the facility. Visit to build trust and highlight Palliative Medicine as specialized medical care for people living with serious illness, aimed at facilitating better quality of life through symptoms relief, assisting with advance care planning and complex medical decision making. NP called Marcelino Duster and updated her on visit. She expressed appreciation for the update and palliative service.   Palliative care team will continue to support patient, patient's family, and medical team.  ASSESSMENT AND / RECOMMENDATIONS:  ------------------------------------------------------------------------------------------------------------------------------------------------- Advance Care Planning: Our advance care planning conversation included a discussion about:    The value and importance of advance care planning  Difference between Hospice and Palliative care Exploration of goals of care in the event of a sudden injury or illness  Identification and preparation of a healthcare agent  Review and updating or creation of an  advance directive document . Decision not to resuscitate or to de-escalate disease focused  treatments due to poor prognosis.  CODE STATUS: Full code  Goals of Care: Goals include to maximize quality of life and symptom management  I spent 16  minutes providing this initial consultation. More than 50% of the time in this consultation was spent on counseling patient and coordinating communication. --------------------------------------------------------------------------------------------------------------------------------------  Symptom Management/Plan: Fall: Hospitalized 6/6-6/11/24 due to traumatic rhabdomyolysis in the setting of fall, acute kidney injury. CBC CMP 03/29/23 at facility shows AKI resolved, LFTs improving.  PT/OT for gait training, strengthening and mobility.   Protein caloric malnutrition: Albumin 03/29/23 is 3.0, total protein 5.3. Continue house supplement for nutritional augmentation. Routine CBC CMP  Dementia: Severe dementia, BIMS score at facility 7 out of 15, SLUMS is 9 out of 30.  Continue ongoing supportive care.  Maintain safety precautions.  Encouraged the reminiscence, provide cueing as needed. ST following for cognition  Left knee pain: related to arthritis. Managed with Hydrocodone  Follow up: Palliative care will continue to follow for complex medical decision making, advance care planning, and clarification of goals. Return 6 weeks or prn. Encouraged to call provider sooner with any concerns.   Family /Caregiver/Community Supports: Patient in SNF for ongoing care.  HOSPICE ELIGIBILITY/DIAGNOSIS: TBD  Chief Complaint: Initial Palliative care visit  HISTORY OF PRESENT ILLNESS:  Raymond Cuevas is a 89 y.o. year old male  with multiple morbidities requiring close monitoring/management, and with high risk of complications and  mortality:  Dementia, Fall, AKI and raumatic rhabdomyolysis in the setting of fall,  protein caloric malnutrition. Pt endorsed pain in left knee during visit, medicated by nursing with good result.  History obtained from  review of EMR, discussion with primary team, caregiver, family and/or Mr. Melida Quitter.  Review and summarization of Epic records shows history from other than patient. Rest of 10  point ROS asked and negative. Independent interpretation of tests and reviewed as needed, available labs, patient records, imaging, studies and related documents from the EMR.  PHYSICAL EXAM: GEN: in no acute distress  Cardiac: S1 S2, no LE edema feet/ankles  Respiratory:  clear to auscultation bilaterally, GI: soft, nontender,  + BS   GU: Foley in place, clear light yellow urine MS: ambulatory with rolling walker Skin: warm and dry, no rash to visible skin Neuro: Generalized weakness, nonfocal alert and oriented x 2, memory loss/confusion Psych: non-anxious affect  Recent Labs  Lab 03/26/23 0531 03/27/23 0516 03/28/23 0501  NA 139 140 140  K 4.0 4.0 3.7  CL 110 109 107  CO2 23 23 25   BUN 20 19 18   CREATININE 0.90 0.88 0.89  GLUCOSE 103* 98 107*   Latest GFR by Cockcroft Gault (not valid in AKI or ESRD) Estimated Creatinine Clearance: 77.5 mL/min (by C-G formula based on SCr of 0.89 mg/dL). Recent Labs  Lab 03/28/23 0501  AST 48*  ALT 38  ALKPHOS 38  x   PAST MEDICAL HISTORY:  Active Ambulatory Problems    Diagnosis Date Noted   S/P right THA, AA 08/27/2013   Morbid obesity (HCC) 08/28/2013   Dyspnea on exertion 01/21/2017   Essential hypertension 01/21/2017   Severe aortic stenosis by prior echocardiogram 02/02/2022   Hyperlipidemia LDL goal <100 09/25/2020   Goals of care, counseling/discussion 02/28/2022   Rhabdomyolysis 03/23/2023   Dementia without behavioral disturbance (HCC) 03/23/2023   Hypokalemia 03/23/2023   Acute urinary retention 03/23/2023   Elevated LFTs 03/23/2023   AKI (acute kidney injury) (HCC) 03/23/2023   Malnutrition of moderate degree 03/24/2023   Resolved Ambulatory Problems    Diagnosis Date Noted   S/P right THA, AA 08/27/2013   Past Medical History:   Diagnosis Date   COPD (chronic obstructive pulmonary disease) (HCC)    Dementia due to general medical condition without behavioral disturbance (HCC)    GERD (gastroesophageal reflux disease)    Hypertension    Osteoarthritis     SOCIAL HX:  Social History   Tobacco Use   Smoking status: Never   Smokeless tobacco: Never  Substance Use Topics   Alcohol use: Yes    Alcohol/week: 1.0 standard drink of alcohol    Types: 1 Cans of beer per week    Comment: rarely     FAMILY HX:  Family History  Problem Relation Age of Onset   Sudden death Sister 90      ALLERGIES:  Allergies  Allergen Reactions   Codeine Other (See Comments)    "Walking the walls"   Morphine And Codeine Other (See Comments)    "Walking the walls"   Sulfa Antibiotics Rash      PERTINENT MEDICATIONS:  Outpatient Encounter Medications as of 03/31/2023  Medication Sig   acetaminophen (TYLENOL) 500 MG tablet Take 500-1,000 mg by mouth every 6 (six) hours as needed for mild pain or headache.   albuterol (VENTOLIN HFA) 108 (90 Base) MCG/ACT inhaler INHALE 2 PUFFS BY MOUTH AS NEEDED 4 TIMES DAILY UNTIL WHEEZING EXTINGUISHED. RINSE AND SPIT AFTER USE. DEMONSTRATE PROPER TECHNIQUE INHALATI (Patient taking differently: Inhale 2 puffs into the lungs 4 (four) times daily as needed ("until wheezing is extinguished- rinse and spit after each use").)   Ascorbic Acid (VITAMIN C) 1000 MG tablet Take 1,000 mg by mouth daily.   aspirin EC 81 MG tablet Take 81 mg by mouth daily.   Calcium Carb-Cholecalciferol 600-20 MG-MCG TABS  Take 1 tablet by mouth daily.   finasteride (PROSCAR) 5 MG tablet Take 1 tablet (5 mg total) by mouth daily.   HYDROcodone-acetaminophen (NORCO/VICODIN) 5-325 MG tablet Take 1-2 tablets by mouth every 4 (four) hours as needed for moderate pain.   levothyroxine (SYNTHROID) 25 MCG tablet Take 25 mcg by mouth daily before breakfast.   magnesium hydroxide (MILK OF MAGNESIA) 400 MG/5ML suspension Take 30 mLs  by mouth daily as needed for moderate constipation or heartburn.   omeprazole (PRILOSEC OTC) 20 MG tablet Take 20 mg by mouth daily.   tamsulosin (FLOMAX) 0.4 MG CAPS capsule Take 1 capsule (0.4 mg) by mouth daily after supper.   vitamin B-12 (CYANOCOBALAMIN) 1000 MCG tablet Take 1,000 mcg by mouth daily.   No facility-administered encounter medications on file as of 03/31/2023.     Thank you for the opportunity to participate in the care of Mr. Zweig.  The palliative care team will continue to follow. Please call our office at [phone] if we can be of additional assistance.   Note: Portions of this note were generated with Scientist, clinical (histocompatibility and immunogenetics). Dictation errors may occur despite best attempts at proofreading.  Rosaura Carpenter, NP

## 2023-03-31 NOTE — Patient Outreach (Signed)
[facility] Post- Acute Care Coordinator follow up. Mr. Laprade resides in [place]  skilled nursing facility.  Screening for potential [facility] care coordination services as a benefit of health plan and primary care provider.  Collaboration with [facility] RN Care Coordinator and [facility] discharge planner. Mr. Sciullo is from home alone. Has history of dementia. Care plan meeting is scheduled for next week. Details about transition plans will be known after care plan meeting.   Will continue to follow.   Raiford Noble, MSN, RN,BSN [facility] Post Acute Care Coordinator [phone] (Direct dial)

## 2023-04-10 ENCOUNTER — Other Ambulatory Visit: Payer: Self-pay | Admitting: *Deleted

## 2023-04-10 NOTE — Patient Outreach (Signed)
Per [facility] [name] resides in [facility] [place] skilled nursing facility. [name] active with [facility] care coordination services, prior, as benefit of health plan and PCP.   Secure communication sent to [employer] for collaboration about transition plans and potential care coordination needs.  Will continue to follow.   Raymond Noble, MSN, RN,BSN [facility] Post Acute Care Coordinator [phone] (Direct dial)

## 2023-04-17 DIAGNOSIS — M1712 Unilateral primary osteoarthritis, left knee: Secondary | ICD-10-CM | POA: Diagnosis not present

## 2023-04-17 DIAGNOSIS — N401 Enlarged prostate with lower urinary tract symptoms: Secondary | ICD-10-CM | POA: Diagnosis not present

## 2023-04-17 DIAGNOSIS — R338 Other retention of urine: Secondary | ICD-10-CM | POA: Diagnosis not present

## 2023-04-18 ENCOUNTER — Other Ambulatory Visit: Payer: Self-pay | Admitting: *Deleted

## 2023-04-18 ENCOUNTER — Telehealth: Payer: Self-pay | Admitting: *Deleted

## 2023-04-18 NOTE — Patient Outreach (Signed)
Per [facility] Mr. Josephsen resides in [facility] [place] skilled nursing facility. Mr. Arts was active with [facility] care coordination team prior to admission.   Previous update received from [facility] [place] discharge planner indicated transition plan is to return home with maximized home health services. Previous care plan meeting was with step daughter Darl Pikes. Jill Side reports both Darl Pikes and Mr. Ryals are in agreement that additional assistance is needed in the home.   Writer attempted to reach Darl Pikes [facility]) [phone] to discuss transition plans. No answer. HIPAA compliant voicemail message left to request return call. Attempted to reach Mr. Bernstein at [phone]. No answer. Phone rang and rang.   Telephone call made to [facility] [place] discharge planner. Jill Side reports the transition plan remains to return home. States Darl Pikes is looking into further assistance. Jill Side states she has discussed life alert system with Darl Pikes as well. Jill Side states Darl Pikes has remained primary contact during Mr. Beltre's stay. Jill Side states she will arrange home health for PT/OT/RN/SW/aide. Home health agency not known yet.  Will likely transition home next week.   Will update care coordination team.  Raiford Noble, MSN, RN,BSN [facility] Post Acute Care Coordinator [phone] (Direct dial)

## 2023-04-18 NOTE — Progress Notes (Signed)
Patient in [facility] Nursing home.

## 2023-04-18 NOTE — Progress Notes (Signed)
  Care Coordination Note  04/18/2023 Name: GANON BRISBIN MRN: [id number] DOB: 1934/04/11  ALADINO COURY is a 89 y.o. year old male who is a primary care patient of Merri Brunette, MD and is actively engaged with the care management team. I reached out to Flossie Buffy by phone today to assist with re-scheduling a follow up visit with the Licensed Clinical Social Worker  Follow up plan: Unsuccessful telephone outreach attempt made. A HIPAA compliant phone message was left for the patient providing contact information and requesting a return call.   [facility]  Care Coordination Care Guide  Direct Dial: [phone]

## 2023-04-19 ENCOUNTER — Ambulatory Visit: Payer: Self-pay | Admitting: *Deleted

## 2023-04-19 NOTE — Patient Instructions (Signed)
Visit Information  Thank you for taking time to visit with me today. Please don't hesitate to contact me if I can be of assistance to you.   Following are the goals we discussed today:   Goals Addressed             This Visit's Progress    Provide resources,support and guidance for caregiving/needs        Activities and task to complete in order to accomplish goals.   Call SNF discharge planner to inquire about discharge plans for [name] Call your insurance provider for more information about your Enhanced Benefits   Review private pay home care options provided and discussed Review insurance policy to see if you have Long-term care insurance or a custodial care benefit Review the [employer] and Attendance program to see if you qualify [phone] Complete Advance Directive packet being mailed to you  Have advance directive notarized and provide a copy to provider office   Consider home devices (life alert, alexa, cameras,etc) Pharmacy team to discuss medication support to ensure compliance and accuracy          Our next appointment is by telephone on 04/24/23  Please call the care guide team at [phone] if you need to cancel or reschedule your appointment.   If you are experiencing a Mental Health or Behavioral Health Crisis or need someone to talk to, please call the Suicide and Crisis Lifeline: 988 call 911   The patient verbalized understanding of instructions, educational materials, and care plan provided today and DECLINED offer to receive copy of patient instructions, educational materials, and care plan.   Telephone follow up appointment with care management team member scheduled for: 04/24/23   Reece Levy, MSW, LCSW Clinical Social Worker [facility] [phone]

## 2023-04-19 NOTE — Patient Outreach (Addendum)
  Care Coordination   Follow Up Visit Note   04/19/2023 Name: Raymond Cuevas MRN: [id number] DOB: 05/19/1934  Raymond Cuevas is a 89 y.o. year old male who sees Raymond Brunette, MD for primary care. I  spoke with pt's DIL, Raymond Cuevas, by phone  What matters to the patients health and wellness today?  Pt remains at SNF rehab- family unaware of d/c date yet.    Goals Addressed             This Visit's Progress    Provide resources,support and guidance for caregiving/needs        Activities and task to complete in order to accomplish goals.   Call SNF discharge planner to inquire about discharge plans for Raymond Cuevas- alert them to your desire for long term placement and inability for adequate care at home. Call your insurance provider for more information about your Enhanced Benefits   Review private pay home care options provided and discussed Review insurance policy to see if you have Long-term care insurance or a custodial care benefit Review the [employer] and Attendance program to see if you qualify [phone] Complete Advance Directive packet being mailed to you  Have advance directive notarized and provide a copy to provider office   Consider home devices (life alert, alexa, cameras,etc) Pharmacy team to discuss medication support to ensure compliance and accuracy          SDOH assessments and interventions completed:  Yes     Care Coordination Interventions:  Yes, provided  Interventions Today    Flowsheet Row Most Recent Value  Chronic Disease   Chronic disease during today's visit Other, Hypertension (HTN)  General Interventions   General Interventions Discussed/Reviewed Level of Care  [Pt's family wants longterm placement. Advised family to advocate for this with SNF social worker as well as about appeal process.]  Level of Care Skilled Nursing Facility, Applications  [Pt is currently in short term SNF rehab at [facility]- no d/c date known per family- encouraged family to  call and inquire so to be prepared. Also encouraged family to review and apply for Veterans program if eligible]  Applications Other  Education Interventions   Education Provided Provided Education  [facility] left for Raymond Cuevas to advise on the appeal process for if they want to appeal the decision for SNF discharge home. Unsure if SNF has issued the Notice of Non-Medicare Coverage but advised her to read the letter for the steps and guidelines/rules]  Applications Other  Safety Interventions   Safety Discussed/Reviewed Safety Discussed  Raymond Cuevas is considering options for when pt returns home to include a church friend who needs housing to possibly move in and be of assistance. this friend does work during day]       Follow up plan: Follow up call scheduled for 04/24/23    Encounter Outcome:  Pt. Visit Completed

## 2023-04-21 ENCOUNTER — Other Ambulatory Visit: Payer: Self-pay | Admitting: *Deleted

## 2023-04-21 NOTE — Patient Outreach (Addendum)
Raymond Cuevas resides in [facility] [place]. He is active with [facility] care coordination team.   Telephone call made to [facility]) [phone]. Patient identifiers confirmed. Darl Pikes states she was made aware of Raymond Cuevas pending discharge on this past Tuesday. Darl Pikes states she is aware Raymond Cuevas will discharge home on Tuesday, July 9th. However, she states she did not realize she could appeal the discharge.   Darl Pikes goes on to say there will not be anyone with Raymond Cuevas 24/7 when he returns home. States she is considering asking a church friend to stay with him, but states she is not sure Raymond Cuevas will go for it. Darl Pikes reports she still works and she is trying make calls and etc between work hours. States she goes to work today at [employer] Darl Pikes reports she has her own health concerns herself and doctor's appointments and she is just helping Raymond Cuevas out because there is no one else to do so.   Darl Pikes states Raymond Cuevas's ex- son in law is going to take a look at his air conditioner this weekend.  States it is 86 degrees in Raymond Cuevas home.  Darl Pikes states Raymond Cuevas is wants to go home and not stay LTC. Explained to Darl Pikes that may be his safest option. Reiterated to Darl Pikes to please call the [facility] to see if Raymond Cuevas qualifies for additional benefits for in home care or LTC.  Darl Pikes states she will contact the [place] today before she goes to work. She has the telephone number. Darl Pikes also reports she is going to call Raymond Cuevas's insurance agent.   Meanwhile, Clinical research associate sent secure message to [place] [facility] [place] to make aware of writer's conversation with Darl Pikes. Also inquired whether discharge date could be extended and about how Raymond Cuevas is doing with therapy.   Will continue to follow.   Raymond Noble, MSN, RN,BSN [facility] Post Acute Care Coordinator [phone] (Direct dial)

## 2023-04-24 ENCOUNTER — Ambulatory Visit: Payer: Self-pay | Admitting: *Deleted

## 2023-04-24 ENCOUNTER — Other Ambulatory Visit: Payer: Self-pay | Admitting: *Deleted

## 2023-04-24 NOTE — Patient Outreach (Signed)
  Care Coordination   Follow Up Visit Note   04/24/2023 Name: Raymond Cuevas MRN: [id number] DOB: 18-Nov-1933  Raymond Cuevas is a 89 y.o. year old male who sees Merri Brunette, MD for primary care. I  spoke with Darl Pikes, pt's DIL, by phone today  What matters to the patients health and wellness today?  Insurance denied their appeal and will stop covering SNF 04/25/23.    Goals Addressed             This Visit's Progress    Provide resources,support and guidance for caregiving/needs        Activities and task to complete in order to accomplish goals.   Continue to work the [employer] and SNF SW regarding long term needs Consider options for in home support- private duty, [place] program, [employer] provider for more information about your Enhanced Benefits   Inquire about Medicaid eligibility and to find out how to "spend down" if needed Review private pay home care options provided and discussed Review insurance policy to see if you have Long-term care insurance or a custodial care benefit Review the [employer] and Attendance program to see if you qualify [phone] Complete Advance Directive packet being mailed to you  Have advance directive notarized and provide a copy to provider office   Consider home devices (life alert, alexa, cameras,etc) Pharmacy team to discuss medication support to ensure compliance and accuracy          SDOH assessments and interventions completed:  Yes     Care Coordination Interventions:  Yes, provided  Interventions Today    Flowsheet Row Most Recent Value  General Interventions   Level of Care Skilled Nursing Facility  [Pt remains at SNF where he has been advised insurance will stop covering tomorrow- decision made to remain there as private pay until HVAC/AC is repaired. CSW has spoken with family/Susan about LTC placement, coverage and other options to consider.]  Applications Medicaid  [CSW encouraged Darl Pikes to determine if he is  eligible for Medicaid and/or what steps to take to "spend down".]  Education Interventions   Applications Medicaid  [CSW encouraged Darl Pikes to determine if he is eligible for Medicaid and/or what steps to take to "spend down".]  Advanced Directive Interventions   Advanced Directives Discussed/Reviewed Advanced Directives Discussed  [Susan, DIL, has a daughter who is an Pensions consultant to help with pursuing legal documents: HCPOA, Durable POA, etc]       Follow up plan: Follow up call scheduled for 04/25/23    Encounter Outcome:  Pt. Visit Completed

## 2023-04-24 NOTE — Patient Outreach (Signed)
Telephone call made to Darl Pikes [facility]) [phone]. Mr. Neier remains in [facility] with scheduled discharge for tomorrow.   Darl Pikes reports she appealed discharge and appeal was denied. States she was told Mr. Manolis is physically doing all he can do. States she has been advised that he will need someone to stay with him. Darl Pikes states she did not have time to call the VA to inquire if he is eligible for any benefits. Discussed whether it is an option for Mr. Kolton to stay with her and/or pay privately in SNF until [facility] is fixed. Darl Pikes states she is still trying to figure it out. Darl Pikes states the part that is needed to fix the [facility] will not be in until this Friday. Darl Pikes states the SNF is aware of the broken [facility] at Mr. Rocha's home.   Darl Pikes states she is going to make some more calls this morning to see what other options she has for Mr. Melida Quitter. Darl Pikes states she works today at 230 pm. Discussed that Clinical research associate will call her back today at 12 noon.  Collaboration with [facility] LCSW about all of the above. Will reach out to [facility] discharge planner again.   Raiford Noble, MSN, RN,BSN [facility] Post Acute Care Coordinator [phone] (Direct dial)

## 2023-04-24 NOTE — Patient Outreach (Signed)
Post-Acute Care Coordinator follow up.   Message received from St[facility] Caval[facility] discharge planner. Raymond Cuevas states [name] expressed interest in staying LTC at [facility]. States she will call Raymond Cuevas to discuss further as well. States she will have business office speak with both [name] and Raymond Cuevas. Made Raymond Cuevas aware Raymond Cuevas goes to work today at 230 pm. Clinical research associate will call Raymond Cuevas to make her aware of Alison's message in the interim.   Telephone call made to Raymond Cuevas [phone] to make aware of information received from [facility] discharge planner, regarding LTC. Raymond Cuevas states [name] had not mentioned staying long term care when she and her husband visited him yesterday. Made Raymond Cuevas aware that she should contact [facility] to speak with Raymond Cuevas and/or business office to discuss long term care options vs private pay. Raymond Cuevas is not sure if [name] will qualify for Medicaid. Raymond Cuevas states she did speak with the VA and that [name] is eligible for VA benefits for extended care or home care. Raymond Cuevas states she will complete application. In the meantime, Raymond Cuevas states [name] can not stay with her because she works during the day. Encouraged Raymond Cuevas to look further into [name] to stay at [facility] long term since this is safest option. Raymond Cuevas states she will call [facility] to discuss.   Will continue to follow and collaborate. Updated [facility] LCSW about all of the above.   Raymond Noble, MSN, RN,BSN [facility] Post Acute Care Coordinator [phone] (Direct dial)

## 2023-04-24 NOTE — Patient Instructions (Signed)
Visit Information  Thank you for taking time to visit with me today. Please don't hesitate to contact me if I can be of assistance to you.   Following are the goals we discussed today:   Goals Addressed             This Visit's Progress    Provide resources,support and guidance for caregiving/needs        Activities and task to complete in order to accomplish goals.   Continue to work the [employer] and SNF SW regarding long term needs Consider options for in home support- private duty, [place] program, [employer] provider for more information about your Enhanced Benefits   Inquire about Medicaid eligibility and to find out how to "spend down" if needed Review private pay home care options provided and discussed Review insurance policy to see if you have Long-term care insurance or a custodial care benefit Review the [employer] and Attendance program to see if you qualify [phone] Complete Advance Directive packet being mailed to you  Have advance directive notarized and provide a copy to provider office   Consider home devices (life alert, alexa, cameras,etc) Pharmacy team to discuss medication support to ensure compliance and accuracy          Our next appointment is by telephone on 04/25/23 at 3pm  Please call the care guide team at [phone] if you need to cancel or reschedule your appointment.   If you are experiencing a Mental Health or Behavioral Health Crisis or need someone to talk to, please call the Suicide and Crisis Lifeline: 988 call 911   The patient verbalized understanding of instructions, educational materials, and care plan provided today and DECLINED offer to receive copy of patient instructions, educational materials, and care plan.   Telephone follow up appointment with care management team member scheduled for:04/25/23  Reece Levy, MSW, LCSW Clinical Social Worker [facility] [phone]

## 2023-04-24 NOTE — Patient Outreach (Signed)
Post Acute Care Coordinator follow up.   Spoke with Darl Pikes who states she called [facility] discharge planner. Darl Pikes states Jill Side was fortunately in the room with Mr. Estep when she called. Darl Pikes states she came up with a solution for Mr. Bress to stay at [facility] near her. States both she and her husband can check on him daily. Writer emphasized to Darl Pikes that this does not sound safe. Discussed staying private pay (if able to afford) is safer than staying at a hotel alone. Darl Pikes states a semi-private room is 300 and something a day and a private room is 500 and something a day. Darl Pikes states she will call back up to [facility] to see what Mr. Yep thought about staying in semi-private room. Re-iterated the safest option, if able to afford, is to stay private pay at [facility] until Mr. Vallely air conditioning is fixed. Discussed that ultimately, long term care is the best option.  Will follow. Update sent to [facility] LCSW.  Raiford Noble, MSN, RN,BSN [facility] Post Acute Care Coordinator [phone] (Direct dial)

## 2023-04-25 ENCOUNTER — Ambulatory Visit: Payer: Self-pay | Admitting: *Deleted

## 2023-04-25 NOTE — Patient Outreach (Signed)
  Care Coordination   Follow Up Visit Note   04/25/2023 Name: Raymond Cuevas MRN: [id number] DOB: 06-20-1934  Raymond Cuevas is a 89 y.o. year old male who sees Merri Brunette, MD for primary care. I  spoke with Darl Pikes pt's DIL  What matters to the patients health and wellness today?  Pt moving to a private pay/semi-private room at SNF today     Goals Addressed   None     SDOH assessments and interventions completed:  Yes     Care Coordination Interventions:  Yes, provided  Interventions Today    Flowsheet Row Most Recent Value  General Interventions   General Interventions Discussed/Reviewed Level of Care  Level of Care Skilled Nursing Facility  [Pt plans to remain at SNF until his HVAC/AC Is repaired- waiting on part and installation- hopefull by Friday.Pt will move to semi-pvt private pay room today at SNF per Susan]       Follow up plan: Follow up call scheduled for 05/02/23    Encounter Outcome:  Pt. Visit Completed

## 2023-04-27 ENCOUNTER — Telehealth: Payer: Self-pay | Admitting: *Deleted

## 2023-04-27 NOTE — Patient Outreach (Signed)
  Care Coordination   Follow Up Visit Note   04/27/2023 Name: Raymond Cuevas MRN: [id number] DOB: June 17, 1934  Raymond Cuevas is a 89 y.o. year old male who sees Merri Brunette, MD for primary care. I  spoke with Darl Pikes, DIL, by phone today.  What matters to the patients health and wellness today?  Still awaiting HVAC repair at pt's home prior to SNF d/c.    Goals Addressed   None     SDOH assessments and interventions completed:  Yes     Care Coordination Interventions:  Yes, provided  Interventions Today    Flowsheet Row Most Recent Value  General Interventions   Level of Care Skilled Nursing Facility  [Pt remains at [facility] and is now under private pay/semi-private room until the HVAC repair is made at his home. Family planning d/c home once the [facility] is fixed (next week?)]       Follow up plan: Follow up call scheduled for 05/02/23    Encounter Outcome:  Pt. Visit Completed

## 2023-04-28 ENCOUNTER — Telehealth: Payer: Self-pay

## 2023-04-28 ENCOUNTER — Other Ambulatory Visit: Payer: Self-pay | Admitting: *Deleted

## 2023-04-28 DIAGNOSIS — F03C0 Unspecified dementia, severe, without behavioral disturbance, psychotic disturbance, mood disturbance, and anxiety: Secondary | ICD-10-CM | POA: Diagnosis not present

## 2023-04-28 DIAGNOSIS — Z515 Encounter for palliative care: Secondary | ICD-10-CM | POA: Diagnosis not present

## 2023-04-28 DIAGNOSIS — E440 Moderate protein-calorie malnutrition: Secondary | ICD-10-CM | POA: Diagnosis not present

## 2023-04-28 NOTE — Patient Instructions (Signed)
Visit Information  Thank you for taking time to visit with me today. Please don't hesitate to contact me if I can be of assistance to you.   Following are the goals we discussed today:   Goals Addressed             This Visit's Progress    Medication Adherence and help with meals       Patient Goals/Self Care Activities: -Patient/Caregiver will self-administer medications as prescribed as evidenced by self-report/primary caregiver report  -Patient/Caregiver will attend all scheduled provider appointments as evidenced by clinician review of documented attendance to scheduled appointments and patient/caregiver report  Food insecurities Patient has moms meals in the freezer to access.       Spoke with Narda Rutherford. She reports patient will possibly discharge from [facility] on Monday.  Meals already in the home.  Air condition work not complete but hope to be complete over the weekend.  Home health to be coordinated with [facility].  Patient going home with foley catheter.  Appointment set with urology.          Our next appointment is pending SNF discharge.     Please call the care guide team at [phone] if you need to cancel or reschedule your appointment.   If you are experiencing a Mental Health or Behavioral Health Crisis or need someone to talk to, please call the Suicide and Crisis Lifeline: 988   Patient verbalizes understanding of instructions and care plan provided today and agrees to view in MyChart. Active MyChart status and patient understanding of how to access instructions and care plan via MyChart confirmed with patient.     The patient has been provided with contact information for the care management team and has been advised to call with any health related questions or concerns.   Bary Leriche, RN, MSN [facility] Care Management Care Management Coordinator Direct Line [phone]

## 2023-04-28 NOTE — Patient Outreach (Signed)
  Care Coordination   Follow Up Visit Note   04/28/2023 Name: Raymond Cuevas MRN: [id number] DOB: August 21, 1934  URA SLAIGHT is a 89 y.o. year old male who sees Merri Brunette, MD for primary care. I  spoke with Darl Pikes by phone today.  What matters to the patients health and wellness today?  Possible SNF discharge on 05/01/23.    Goals Addressed             This Visit's Progress    Medication Adherence and help with meals       Patient Goals/Self Care Activities: -Patient/Caregiver will self-administer medications as prescribed as evidenced by self-report/primary caregiver report  -Patient/Caregiver will attend all scheduled provider appointments as evidenced by clinician review of documented attendance to scheduled appointments and patient/caregiver report  Food insecurities Patient has moms meals in the freezer to access.       Spoke with Narda Rutherford. She reports patient will possibly discharge from [facility] on Monday.  Meals already in the home.  Air condition work not complete but hope to be complete over the weekend.  Home health to be coordinated with [facility].  Patient going home with foley catheter.  Appointment set with urology.          SDOH assessments and interventions completed:  Yes     Care Coordination Interventions:  Yes, provided   Follow up plan:  RN CM will outreach as appropriate pending discharge from SNF.    Encounter Outcome:  Pt. Visit Completed   Bary Leriche, RN, MSN [facility] Care Management Care Management Coordinator Direct Line [phone]

## 2023-04-28 NOTE — Patient Outreach (Signed)
Post-Acute Care Coordinator follow up. Mr. Jensen resides in [facility].   Telephone call made to Darl Pikes [phone]. Darl Pikes states Mr. Alioto's air conditioner is being fixed. States the part has come in and freon has to be added. States she is on the way to Mr. Mccranie's house now to make payment for air conditioner repair.   Darl Pikes states Mr. Graser will come home on Monday. States she is not sure what the name of the home health agency name is. States she will have to emphasize to Mr. Brisbon to open the door when home health comes to the home. Mr. Reifel will return home alone. Darl Pikes states she is hopeful Mr. Mcclees will receive assistance thru the [place].   Darl Pikes is already aware [facility] care coordination team will follow up post SNF.   Secure message sent to Stasia Cavalier social worker to inquire about home health arrangements.   Will continue to follow.  Raiford Noble, MSN, RN,BSN [facility] Post Acute Care Coordinator [phone] (Direct dial)

## 2023-05-01 ENCOUNTER — Other Ambulatory Visit: Payer: Self-pay | Admitting: *Deleted

## 2023-05-01 NOTE — Patient Outreach (Addendum)
Post-Acute Care Coordinator follow up. Mr. Kiner resides in [facility] skilled nursing facility.   Telephone call received from [place]. However, writer unable to answer due to being in a meeting. Voicemail message received from Darl Pikes stating the air conditioner has been fixed. However, she states she received call from Stasia Cavalier social worker indicating Mr. Farver is now interested in staying at [facility] and not going home. Darl Pikes states she is supposed to have a meeting with Mr. Rogue, Rafalski, and business office representative at [facility] today. Susan's message indicated Mr. Georg may not return home today after all.   Writer attempted to call Darl Pikes back twice at [phone]. No answer and unable to leave voicemail. Voicemail box full. Writer sent text to request return call.   Will continue to follow and try back later today.   Addendum 1456 pm: Telephone call made to [place]. No answer. Unable to leave voicemail message. Mailbox is full.   Will follow up with Stasia Cavalier social worker regarding status of transition plans.   Raiford Noble, MSN, RN,BSN [facility] Post Acute Care Coordinator [phone] (Direct dial)

## 2023-05-02 DIAGNOSIS — Z79899 Other long term (current) drug therapy: Secondary | ICD-10-CM | POA: Diagnosis not present

## 2023-05-03 ENCOUNTER — Other Ambulatory Visit: Payer: Self-pay | Admitting: *Deleted

## 2023-05-03 ENCOUNTER — Telehealth: Payer: Self-pay

## 2023-05-03 NOTE — Patient Outreach (Signed)
  Care Coordination   Follow Up Visit Note   05/03/2023 Name: Raymond Cuevas MRN: [id number] DOB: 11-12-1933  Raymond Cuevas is a 89 y.o. year old male who sees Merri Brunette, MD for primary care. I  spoke with Darl Pikes by phone today.    What matters to the patients health and wellness today?  Possible permanent resident at [facility].    Goals Addressed             This Visit's Progress    Medication Adherence and help with meals       Patient Goals/Self Care Activities: -Patient/Caregiver will self-administer medications as prescribed as evidenced by self-report/primary caregiver report  -Patient/Caregiver will attend all scheduled provider appointments as evidenced by clinician review of documented attendance to scheduled appointments and patient/caregiver report  Food insecurities Patient has moms meals in the freezer to access.       Spoke with Narda Rutherford. Patient continues at [facility] and right now possible staying long term. Will continue to follow up with caregiver.        SDOH assessments and interventions completed:  Yes     Care Coordination Interventions:  Yes, provided   Follow up plan:  will follow up pending SNF status.    Encounter Outcome:  Pt. Visit Completed   Bary Leriche, RN, MSN [facility] Care Management Care Management Coordinator Direct Line [phone]

## 2023-05-03 NOTE — Patient Instructions (Signed)
Visit Information  Thank you for taking time to visit with me today. Please don't hesitate to contact me if I can be of assistance to you.   Following are the goals we discussed today:   Goals Addressed             This Visit's Progress    Medication Adherence and help with meals       Patient Goals/Self Care Activities: -Patient/Caregiver will self-administer medications as prescribed as evidenced by self-report/primary caregiver report  -Patient/Caregiver will attend all scheduled provider appointments as evidenced by clinician review of documented attendance to scheduled appointments and patient/caregiver report  Food insecurities Patient has moms meals in the freezer to access.       Spoke with Narda Rutherford. Patient continues at [facility] and right now possible staying long term. Will continue to follow up with caregiver.         If you are experiencing a Mental Health or Behavioral Health Crisis or need someone to talk to, please call the Suicide and Crisis Lifeline: 988   Patient verbalizes understanding of instructions and care plan provided today and agrees to view in MyChart. Active MyChart status and patient understanding of how to access instructions and care plan via MyChart confirmed with patient.     The patient has been provided with contact information for the care management team and has been advised to call with any health related questions or concerns.   Bary Leriche, RN, MSN [facility] Care Management Care Management Coordinator Direct Line [phone]

## 2023-05-03 NOTE — Patient Outreach (Signed)
Post-Acute Care Coordinator follow up. Mr Raymond Cuevas resides in [facility] skilled [facility]. Mr. Raymond Cuevas has been active with [facility] Care Coordination team prior to admission.   Update received from Jill Side, Programmer, systems. Jill Side reports Mr. Raymond Cuevas is staying for long term care for now. Family working on applying for [employer]. Will remain at [facility] for the unforeseeable future.   Will follow for potential changes in transition plans.  Will update [facility] Care Coordination team.   Raymond Noble, MSN, RN,BSN [facility] Post Acute Care Coordinator [phone] (Direct dial)

## 2023-05-07 DIAGNOSIS — N320 Bladder-neck obstruction: Secondary | ICD-10-CM | POA: Diagnosis not present

## 2023-05-07 DIAGNOSIS — R7881 Bacteremia: Secondary | ICD-10-CM | POA: Diagnosis not present

## 2023-05-07 DIAGNOSIS — S3121XD Laceration without foreign body of penis, subsequent encounter: Secondary | ICD-10-CM | POA: Diagnosis not present

## 2023-05-07 DIAGNOSIS — S3733XD Laceration of urethra, subsequent encounter: Secondary | ICD-10-CM | POA: Diagnosis not present

## 2023-05-07 DIAGNOSIS — E039 Hypothyroidism, unspecified: Secondary | ICD-10-CM | POA: Diagnosis not present

## 2023-05-07 DIAGNOSIS — I350 Nonrheumatic aortic (valve) stenosis: Secondary | ICD-10-CM | POA: Diagnosis not present

## 2023-05-11 ENCOUNTER — Other Ambulatory Visit: Payer: Self-pay | Admitting: *Deleted

## 2023-05-11 ENCOUNTER — Telehealth: Payer: Self-pay | Admitting: *Deleted

## 2023-05-11 NOTE — Patient Outreach (Signed)
Post-Acute Care Coordinator follow up. Per [facility] Mr. Boughner resides in [facility] skilled nursing facility.   Update received from Jill Side, Programmer, systems. Jill Side reports Mr. Mctavish remains at [facility] under private pay in LTC. States they are having another meeting today. States Darl Pikes wants to be sure Mr. Tegtmeyer realizes this is permanent.   Telephone call made to Darl Pikes [facility]) [phone]. Darl Pikes states she just left [facility]. States the plan is for Mr. Geno to remain in long term care. States they have applied for Medicaid and are initiating paperwork for her being POA as well. Darl Pikes reports Mr. Brailsford understands he is safer at [facility] than at home. Darl Pikes states she feels bad because she wanted him to be able to return home but she realizes it is in his best and safest interest to be in SNF.   Will update [facility] care coordination team.   Raiford Noble, MSN, RN,BSN [facility] Post Acute Care Coordinator [phone] (Direct dial)

## 2023-05-18 DIAGNOSIS — R338 Other retention of urine: Secondary | ICD-10-CM | POA: Diagnosis not present

## 2023-05-19 NOTE — Patient Outreach (Signed)
  Care Coordination   Documentation   Visit Note   05/19/2023 Name: JERMANI EBERLEIN MRN: [id number] DOB: Mar 07, 1934  ANAKIN VARKEY is a 89 y.o. year old male who sees Merri Brunette, MD for primary care. I  received message that patient is now long term at facility. Closing case.  What matters to the patients health and wellness today?  N/A    Goals Addressed             This Visit's Progress    COMPLETED: Medication Adherence and help with meals       Patient Goals/Self Care Activities: -Patient/Caregiver will self-administer medications as prescribed as evidenced by self-report/primary caregiver report  -Patient/Caregiver will attend all scheduled provider appointments as evidenced by clinician review of documented attendance to scheduled appointments and patient/caregiver report  Food insecurities Patient has moms meals in the freezer to access.       Patient now long term resident at [facility].  RNCM closing case.         SDOH assessments and interventions completed:  Yes     Care Coordination Interventions:  No, not indicated   Follow up plan: No further intervention required.   Encounter Outcome:  Pt. Visit Completed   Bary Leriche, RN, MSN [facility] Care Management Care Management Coordinator Direct Line [phone]

## 2023-05-24 DIAGNOSIS — F03C0 Unspecified dementia, severe, without behavioral disturbance, psychotic disturbance, mood disturbance, and anxiety: Secondary | ICD-10-CM | POA: Diagnosis not present

## 2023-05-24 DIAGNOSIS — Z515 Encounter for palliative care: Secondary | ICD-10-CM | POA: Diagnosis not present

## 2023-05-24 DIAGNOSIS — E440 Moderate protein-calorie malnutrition: Secondary | ICD-10-CM | POA: Diagnosis not present

## 2023-05-30 DIAGNOSIS — R338 Other retention of urine: Secondary | ICD-10-CM | POA: Diagnosis not present

## 2023-05-30 DIAGNOSIS — N401 Enlarged prostate with lower urinary tract symptoms: Secondary | ICD-10-CM | POA: Diagnosis not present

## 2023-06-08 ENCOUNTER — Telehealth: Payer: Self-pay

## 2023-06-08 NOTE — Patient Outreach (Signed)
  Care Coordination   Follow Up Visit Note   06/08/2023 Name: Raymond Cuevas MRN: [id number] DOB: 15-Sep-1934  Raymond Cuevas is a 89 y.o. year old male who sees Merri Brunette, MD for primary care. I  spoke with caregiver Lewanda Rife by phone today.  She states that patient is still getting MOM's meals despite being in SNF.  She states she called to cancel but was told someone from [facility] needed to call.  Advised that CM would call.     Telephone call to Mom's meals.  Spoke with Yvonna Alanis to cancel meals.   What matters to the patients health and wellness today?  Cancelling meals     Goals Addressed   None     SDOH assessments and interventions completed:  Yes     Care Coordination Interventions:  Yes, provided   Follow up plan: No further intervention required.   Encounter Outcome:  Pt. Visit Completed   Jaylynn Siefert Idelle Jo, RN, MSN [facility]  [facility] Management Community Coordinator Direct Dial: [phone]  Fax: [phone] Website: [email or web address]

## 2023-06-08 NOTE — Patient Instructions (Signed)
Visit Information  Thank you for taking time to visit with me today. Please don't hesitate to contact me if I can be of assistance to you.   Following are the goals we discussed today:   Goals Addressed   None      If you are experiencing a Mental Health or Behavioral Health Crisis or need someone to talk to, please call the Suicide and Crisis Lifeline: 988   Patient verbalizes understanding of instructions and care plan provided today and agrees to view in MyChart. Active MyChart status and patient understanding of how to access instructions and care plan via MyChart confirmed with patient.     The patient has been provided with contact information for the care management team and has been advised to call with any health related questions or concerns.   Bary Leriche, RN, MSN [facility] Management Community Coordinator Direct Dial: [phone]  Fax: [phone] Website: [email or web address]

## 2023-06-20 DIAGNOSIS — N319 Neuromuscular dysfunction of bladder, unspecified: Secondary | ICD-10-CM | POA: Diagnosis not present

## 2023-06-20 DIAGNOSIS — R338 Other retention of urine: Secondary | ICD-10-CM | POA: Diagnosis not present

## 2023-06-25 DIAGNOSIS — M25562 Pain in left knee: Secondary | ICD-10-CM | POA: Diagnosis not present

## 2023-06-26 DIAGNOSIS — R059 Cough, unspecified: Secondary | ICD-10-CM | POA: Diagnosis not present

## 2023-06-27 DIAGNOSIS — Z515 Encounter for palliative care: Secondary | ICD-10-CM | POA: Diagnosis not present

## 2023-06-27 DIAGNOSIS — E440 Moderate protein-calorie malnutrition: Secondary | ICD-10-CM | POA: Diagnosis not present

## 2023-06-27 DIAGNOSIS — F03C0 Unspecified dementia, severe, without behavioral disturbance, psychotic disturbance, mood disturbance, and anxiety: Secondary | ICD-10-CM | POA: Diagnosis not present

## 2023-06-27 DIAGNOSIS — Z79899 Other long term (current) drug therapy: Secondary | ICD-10-CM | POA: Diagnosis not present

## 2023-06-27 DIAGNOSIS — M25562 Pain in left knee: Secondary | ICD-10-CM | POA: Diagnosis not present

## 2023-06-27 DIAGNOSIS — I10 Essential (primary) hypertension: Secondary | ICD-10-CM | POA: Diagnosis not present

## 2023-07-18 DIAGNOSIS — Z515 Encounter for palliative care: Secondary | ICD-10-CM | POA: Diagnosis not present

## 2023-07-18 DIAGNOSIS — F03C0 Unspecified dementia, severe, without behavioral disturbance, psychotic disturbance, mood disturbance, and anxiety: Secondary | ICD-10-CM | POA: Diagnosis not present

## 2023-07-18 DIAGNOSIS — E440 Moderate protein-calorie malnutrition: Secondary | ICD-10-CM | POA: Diagnosis not present

## 2023-08-16 DIAGNOSIS — L603 Nail dystrophy: Secondary | ICD-10-CM | POA: Diagnosis not present

## 2023-08-16 DIAGNOSIS — I739 Peripheral vascular disease, unspecified: Secondary | ICD-10-CM | POA: Diagnosis not present

## 2023-08-16 DIAGNOSIS — L602 Onychogryphosis: Secondary | ICD-10-CM | POA: Diagnosis not present

## 2023-08-16 DIAGNOSIS — L84 Corns and callosities: Secondary | ICD-10-CM | POA: Diagnosis not present

## 2023-08-17 DIAGNOSIS — F03C0 Unspecified dementia, severe, without behavioral disturbance, psychotic disturbance, mood disturbance, and anxiety: Secondary | ICD-10-CM | POA: Diagnosis not present

## 2023-08-17 DIAGNOSIS — Z515 Encounter for palliative care: Secondary | ICD-10-CM | POA: Diagnosis not present

## 2023-08-17 DIAGNOSIS — E440 Moderate protein-calorie malnutrition: Secondary | ICD-10-CM | POA: Diagnosis not present

## 2023-08-21 DIAGNOSIS — D649 Anemia, unspecified: Secondary | ICD-10-CM | POA: Diagnosis not present

## 2023-09-20 DIAGNOSIS — Z515 Encounter for palliative care: Secondary | ICD-10-CM | POA: Diagnosis not present

## 2023-09-20 DIAGNOSIS — F03C0 Unspecified dementia, severe, without behavioral disturbance, psychotic disturbance, mood disturbance, and anxiety: Secondary | ICD-10-CM | POA: Diagnosis not present

## 2023-09-20 DIAGNOSIS — E440 Moderate protein-calorie malnutrition: Secondary | ICD-10-CM | POA: Diagnosis not present

## 2023-09-24 DIAGNOSIS — B356 Tinea cruris: Secondary | ICD-10-CM | POA: Diagnosis not present

## 2023-09-24 DIAGNOSIS — M25562 Pain in left knee: Secondary | ICD-10-CM | POA: Diagnosis not present

## 2023-09-24 DIAGNOSIS — I350 Nonrheumatic aortic (valve) stenosis: Secondary | ICD-10-CM | POA: Diagnosis not present

## 2023-09-24 DIAGNOSIS — N320 Bladder-neck obstruction: Secondary | ICD-10-CM | POA: Diagnosis not present

## 2023-09-26 DIAGNOSIS — J811 Chronic pulmonary edema: Secondary | ICD-10-CM | POA: Diagnosis not present

## 2023-10-07 DIAGNOSIS — E039 Hypothyroidism, unspecified: Secondary | ICD-10-CM | POA: Diagnosis not present

## 2023-10-24 DIAGNOSIS — H903 Sensorineural hearing loss, bilateral: Secondary | ICD-10-CM | POA: Diagnosis not present

## 2023-10-27 DIAGNOSIS — I739 Peripheral vascular disease, unspecified: Secondary | ICD-10-CM | POA: Diagnosis not present

## 2023-10-27 DIAGNOSIS — L603 Nail dystrophy: Secondary | ICD-10-CM | POA: Diagnosis not present

## 2023-10-27 DIAGNOSIS — L602 Onychogryphosis: Secondary | ICD-10-CM | POA: Diagnosis not present

## 2023-10-27 DIAGNOSIS — L84 Corns and callosities: Secondary | ICD-10-CM | POA: Diagnosis not present

## 2023-11-14 DIAGNOSIS — F03C0 Unspecified dementia, severe, without behavioral disturbance, psychotic disturbance, mood disturbance, and anxiety: Secondary | ICD-10-CM | POA: Diagnosis not present

## 2023-11-14 DIAGNOSIS — E440 Moderate protein-calorie malnutrition: Secondary | ICD-10-CM | POA: Diagnosis not present

## 2023-11-14 DIAGNOSIS — Z515 Encounter for palliative care: Secondary | ICD-10-CM | POA: Diagnosis not present

## 2023-11-21 DIAGNOSIS — M25562 Pain in left knee: Secondary | ICD-10-CM | POA: Diagnosis not present

## 2023-11-21 DIAGNOSIS — D649 Anemia, unspecified: Secondary | ICD-10-CM | POA: Diagnosis not present

## 2023-11-21 DIAGNOSIS — R509 Fever, unspecified: Secondary | ICD-10-CM | POA: Diagnosis not present

## 2023-11-21 DIAGNOSIS — I10 Essential (primary) hypertension: Secondary | ICD-10-CM | POA: Diagnosis not present

## 2023-11-21 DIAGNOSIS — E039 Hypothyroidism, unspecified: Secondary | ICD-10-CM | POA: Diagnosis not present

## 2024-01-02 DIAGNOSIS — M6281 Muscle weakness (generalized): Secondary | ICD-10-CM | POA: Diagnosis not present

## 2024-01-02 DIAGNOSIS — F03C0 Unspecified dementia, severe, without behavioral disturbance, psychotic disturbance, mood disturbance, and anxiety: Secondary | ICD-10-CM | POA: Diagnosis not present

## 2024-01-02 DIAGNOSIS — E440 Moderate protein-calorie malnutrition: Secondary | ICD-10-CM | POA: Diagnosis not present

## 2024-01-02 DIAGNOSIS — R278 Other lack of coordination: Secondary | ICD-10-CM | POA: Diagnosis not present

## 2024-01-02 DIAGNOSIS — Z515 Encounter for palliative care: Secondary | ICD-10-CM | POA: Diagnosis not present

## 2024-01-03 DIAGNOSIS — R278 Other lack of coordination: Secondary | ICD-10-CM | POA: Diagnosis not present

## 2024-01-03 DIAGNOSIS — M6281 Muscle weakness (generalized): Secondary | ICD-10-CM | POA: Diagnosis not present

## 2024-01-04 DIAGNOSIS — M6281 Muscle weakness (generalized): Secondary | ICD-10-CM | POA: Diagnosis not present

## 2024-01-04 DIAGNOSIS — R278 Other lack of coordination: Secondary | ICD-10-CM | POA: Diagnosis not present

## 2024-01-05 DIAGNOSIS — R278 Other lack of coordination: Secondary | ICD-10-CM | POA: Diagnosis not present

## 2024-01-05 DIAGNOSIS — M6281 Muscle weakness (generalized): Secondary | ICD-10-CM | POA: Diagnosis not present

## 2024-01-08 DIAGNOSIS — R278 Other lack of coordination: Secondary | ICD-10-CM | POA: Diagnosis not present

## 2024-01-08 DIAGNOSIS — M6281 Muscle weakness (generalized): Secondary | ICD-10-CM | POA: Diagnosis not present

## 2024-01-10 DIAGNOSIS — R278 Other lack of coordination: Secondary | ICD-10-CM | POA: Diagnosis not present

## 2024-01-10 DIAGNOSIS — M6281 Muscle weakness (generalized): Secondary | ICD-10-CM | POA: Diagnosis not present

## 2024-01-11 DIAGNOSIS — R278 Other lack of coordination: Secondary | ICD-10-CM | POA: Diagnosis not present

## 2024-01-11 DIAGNOSIS — M6281 Muscle weakness (generalized): Secondary | ICD-10-CM | POA: Diagnosis not present

## 2024-01-18 DIAGNOSIS — R278 Other lack of coordination: Secondary | ICD-10-CM | POA: Diagnosis not present

## 2024-01-18 DIAGNOSIS — M6281 Muscle weakness (generalized): Secondary | ICD-10-CM | POA: Diagnosis not present

## 2024-01-23 DIAGNOSIS — M6281 Muscle weakness (generalized): Secondary | ICD-10-CM | POA: Diagnosis not present

## 2024-01-23 DIAGNOSIS — R278 Other lack of coordination: Secondary | ICD-10-CM | POA: Diagnosis not present

## 2024-02-06 DIAGNOSIS — Z515 Encounter for palliative care: Secondary | ICD-10-CM | POA: Diagnosis not present

## 2024-02-06 DIAGNOSIS — E440 Moderate protein-calorie malnutrition: Secondary | ICD-10-CM | POA: Diagnosis not present

## 2024-02-06 DIAGNOSIS — F03C0 Unspecified dementia, severe, without behavioral disturbance, psychotic disturbance, mood disturbance, and anxiety: Secondary | ICD-10-CM | POA: Diagnosis not present

## 2024-03-06 DIAGNOSIS — I739 Peripheral vascular disease, unspecified: Secondary | ICD-10-CM | POA: Diagnosis not present

## 2024-03-06 DIAGNOSIS — L602 Onychogryphosis: Secondary | ICD-10-CM | POA: Diagnosis not present

## 2024-03-14 DIAGNOSIS — N319 Neuromuscular dysfunction of bladder, unspecified: Secondary | ICD-10-CM | POA: Diagnosis not present

## 2024-03-14 DIAGNOSIS — R338 Other retention of urine: Secondary | ICD-10-CM | POA: Diagnosis not present

## 2024-03-18 ENCOUNTER — Other Ambulatory Visit (HOSPITAL_COMMUNITY): Payer: Self-pay | Admitting: Nurse Practitioner

## 2024-03-18 DIAGNOSIS — R339 Retention of urine, unspecified: Secondary | ICD-10-CM

## 2024-03-18 DIAGNOSIS — Z515 Encounter for palliative care: Secondary | ICD-10-CM | POA: Diagnosis not present

## 2024-03-18 DIAGNOSIS — N319 Neuromuscular dysfunction of bladder, unspecified: Secondary | ICD-10-CM

## 2024-03-18 DIAGNOSIS — F03C0 Unspecified dementia, severe, without behavioral disturbance, psychotic disturbance, mood disturbance, and anxiety: Secondary | ICD-10-CM | POA: Diagnosis not present

## 2024-03-18 DIAGNOSIS — E440 Moderate protein-calorie malnutrition: Secondary | ICD-10-CM | POA: Diagnosis not present

## 2024-04-03 ENCOUNTER — Other Ambulatory Visit (HOSPITAL_COMMUNITY): Payer: Self-pay | Admitting: Nurse Practitioner

## 2024-04-03 DIAGNOSIS — R339 Retention of urine, unspecified: Secondary | ICD-10-CM

## 2024-04-03 DIAGNOSIS — N319 Neuromuscular dysfunction of bladder, unspecified: Secondary | ICD-10-CM

## 2024-04-04 ENCOUNTER — Other Ambulatory Visit: Payer: Self-pay | Admitting: Radiology

## 2024-04-04 DIAGNOSIS — R338 Other retention of urine: Secondary | ICD-10-CM

## 2024-04-04 NOTE — H&P (Addendum)
 Chief Complaint: Urinary retention  Referring Provider(s): Williemae Harsh  Supervising Physician: Marland Silvas  Patient Status: [facility] - Out-pt  History of Present Illness: Raymond Cuevas is a 89 y.o. male with urinary retention who is here today for placement of a suprapubic catheter.  He is from a nursing facility. The patient denies prostate hypertrophy and tells me he isn't sure why his bladder doesn't work right.  He currently has a foley in place.  He is NPO.   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 => TTE 02/02/2022: EF> 75%.  Moderate LVH.  GR 1 DD.  Normal RV and RVP.  Severe AS.  Mean gradient 44 mmHg up from 37 mmHg    Past Surgical History:  Procedure Laterality Date   CATARACT EXTRACTION, BILATERAL Bilateral    CHOLECYSTECTOMY     laparoscopic   EYE SURGERY Left    hole repair   JOINT REPLACEMENT Right    Knee   NM MYOVIEW  LTD  11/2016    normal EF (55 to 65%).  No ischemia or infarction.  LOW RISK.   REPLACEMENT TOTAL KNEE     RIGHT/LEFT HEART CATH AND CORONARY ANGIOGRAPHY N/A 03/09/2022   Procedure: RIGHT/LEFT HEART CATH AND CORONARY ANGIOGRAPHY;  Surgeon: Arleen Lacer, MD;  Location: [facility] INVASIVE CV LAB;  Service: Cardiovascular;  Laterality: N/A;   TONSILLECTOMY     TOTAL HIP ARTHROPLASTY Right 08/27/2013   Procedure: RIGHT TOTAL HIP ARTHROPLASTY ANTERIOR APPROACH;  Surgeon: Bevin Bucks, MD;  Location: [facility] ORS;  Service: Orthopedics;  Laterality: Right;   TRANSTHORACIC ECHOCARDIOGRAM  12/2017   a) 3/'19: Mild LVH.   Nl EF 55-60%.  GR 1 DD.  No RWMA.  Mild AS (MG 21 mm); Normal LV size, fxn w/o WMA.  Mod Conc VH.  EF 55-60%.  GR 1 DD.  NlRV size & fxn. Mod AS (MG  21 mmHg); b) 12/2019: Mild AS - MG 21 mmHg; c) 07/2021: EF 60-65%. No RWMA. Mod AS (MG 35 mmHg, PG 37 mmHg)   TRANSTHORACIC ECHOCARDIOGRAM  02/02/2022   EF> 75%.  Moderate LVH.  GR 1 DD.  Normal RV and RVP.  Severe aortic valve calcification with SEVERE AS.  MG 44 mmHg up from 37 mmHg; AVA by VTI 1.05 cm.    Allergies: Codeine, Morphine and codeine, and Sulfa antibiotics  Medications: Prior to Admission medications   Medication Sig Start Date End Date Taking? Authorizing Provider  acetaminophen  (TYLENOL ) 500 MG tablet Take 500-1,000 mg by mouth every 6 (six) hours as needed for mild pain or headache.    [provider]  albuterol  (VENTOLIN  HFA) 108 (90 Base) MCG/ACT inhaler INHALE 2 PUFFS BY MOUTH AS NEEDED 4 TIMES DAILY UNTIL WHEEZING EXTINGUISHED. RINSE AND SPIT AFTER USE. DEMONSTRATE PROPER TECHNIQUE INHALATI Patient taking differently: Inhale 2 puffs into the lungs 4 (four) times daily as needed (until wheezing is extinguished- rinse and spit after each use). 08/18/21   Mannam, Praveen, MD  Ascorbic Acid (VITAMIN C) 1000 MG tablet Take 1,000 mg  by mouth daily.    [provider]  aspirin  EC 81 MG tablet Take 81 mg by mouth daily.    [provider]  Calcium  Carb-Cholecalciferol 600-20 MG-MCG TABS Take 1 tablet by mouth daily.    [provider]  finasteride  (PROSCAR ) 5 MG tablet Take 1 tablet (5 mg total) by mouth daily. 03/29/23   Samtani, Jai-Gurmukh, MD  HYDROcodone -acetaminophen  (NORCO/VICODIN) 5-325 MG tablet Take 1-2 tablets by mouth every 4 (four) hours as needed for moderate pain. 03/28/23   Samtani, Jai-Gurmukh, MD  levothyroxine  (SYNTHROID ) 25 MCG tablet Take 25 mcg by mouth daily before breakfast. 07/06/21   [provider]  magnesium  hydroxide (MILK OF MAGNESIA) 400 MG/5ML suspension Take  30 mLs by mouth daily as needed for moderate constipation or heartburn.    [provider]  omeprazole  (PRILOSEC  OTC) 20 MG tablet Take 20 mg by mouth daily.    [provider]  tamsulosin  (FLOMAX ) 0.4 MG CAPS capsule Take 1 capsule (0.4 mg) by mouth daily after supper. 03/28/23   Samtani, Jai-Gurmukh, MD  vitamin B-12 (CYANOCOBALAMIN) 1000 MCG tablet Take 1,000 mcg by mouth daily.    [provider]     Family History  Problem Relation Age of Onset   Sudden death Sister 67    Social History   Socioeconomic History   Marital status: Widowed    Spouse name: Not on file   Number of children: Not on file   Years of education: Not on file   Highest education level: Not on file  Occupational History   Not on file  Tobacco Use   Smoking status: Never   Smokeless tobacco: Never  Vaping Use   Vaping status: Never Used  Substance and Sexual Activity   Alcohol use: Yes    Alcohol/week: 1.0 standard drink of alcohol    Types: 1 Cans of beer per week    Comment: rarely   Drug use: No   Sexual activity: Never    Birth control/protection: None  Other Topics Concern   Not on file  Social History Narrative   Morning coffee (when up)   Social Drivers of Health   Financial Resource Strain: Not on file  Food Insecurity: No Food Insecurity (03/23/2023)   Hunger Vital Sign    Worried About Running Out of Food in the Last Year: Never true    Ran Out of Food in the Last Year: Never true  Transportation Needs: No Transportation Needs (03/23/2023)   PRAPARE - Administrator, Civil Service (Medical): No    Lack of Transportation (Non-Medical): No  Physical Activity: Not on file  Stress: Not on file  Social Connections: Not on file     Review of Systems: A 12 point ROS discussed and pertinent positives are indicated in the HPI above.  All other systems are negative.    Vital Signs: BP (!) 145/55   Pulse (!) 57   Temp 98 F (36.7 C)   Resp 18    Ht 6' 2 (1.88 m)   Wt 248 lb (112.5 kg)   SpO2 94%   BMI 31.84 kg/m   Advance Care Plan: The advanced care place/surrogate decision maker was discussed at the time of visit and the patient did not wish to discuss or was not able to name a surrogate decision maker or provide an advance care plan.  Physical Exam Vitals reviewed.  Constitutional:      Appearance: Normal appearance.  HENT:  Head: Normocephalic and atraumatic.   Eyes:     Extraocular Movements: Extraocular movements intact.    Cardiovascular:     Rate and Rhythm: Normal rate and regular rhythm.  Pulmonary:     Effort: Pulmonary effort is normal. No respiratory distress.     Breath sounds: Normal breath sounds.  Abdominal:     Palpations: Abdomen is soft.   Musculoskeletal:        General: Normal range of motion.     Cervical back: Normal range of motion.   Skin:    General: Skin is warm and dry.   Neurological:     General: No focal deficit present.     Mental Status: He is alert and oriented to person, place, and time.   Psychiatric:        Mood and Affect: Mood normal.        Behavior: Behavior normal.        Thought Content: Thought content normal.        Judgment: Judgment normal.   Foley clamped  Imaging: No results found.  Labs:  CBC: Recent Labs    04/05/24 0849  WBC 6.5  HGB 12.7*  HCT 39.0  PLT 330    COAGS: Recent Labs    04/05/24 0849  INR 1.1    BMP: No results for input(s): NA, K, CL, CO2, GLUCOSE, BUN, CALCIUM , CREATININE, GFRNONAA, GFRAA in the last 8760 hours.  Invalid input(s): CMP  LIVER FUNCTION TESTS: No results for input(s): BILITOT, AST, ALT, ALKPHOS, PROT, ALBUMIN in the last 8760 hours.  TUMOR MARKERS: No results for input(s): AFPTM, CEA, CA199, CHROMGRNA in the last 8760 hours.  Assessment and Plan:  Urinary retention of unknown etiology. Will proceed with image guided placement of a suprapubic catheter  today by Dr. Marlena Sima.  Risks and benefits of suprapubic catheter placement were discussed with the patient and his step daughter Raymond Cuevas including bleeding, infection, damage to adjacent structures, bladder perforation/fistula connection, and sepsis.  All of the patient's questions were answered, patient is agreeable to proceed. Consent signed and in chart.   Electronically Signed: Connor Deiters, PA-C   04/05/2024, 9:27 AM      I spent a total of  30 Minutes   in face to face in clinical consultation, greater than 50% of which was counseling/coordinating care for SP tube placement.

## 2024-04-05 ENCOUNTER — Ambulatory Visit (HOSPITAL_COMMUNITY)
Admission: RE | Admit: 2024-04-05 | Discharge: 2024-04-05 | Disposition: A | Discharge status: Home / Self Care | Source: Ambulatory Visit | Attending: Nurse Practitioner | Admitting: Nurse Practitioner

## 2024-04-05 ENCOUNTER — Other Ambulatory Visit: Payer: Self-pay

## 2024-04-05 DIAGNOSIS — N319 Neuromuscular dysfunction of bladder, unspecified: Secondary | ICD-10-CM | POA: Insufficient documentation

## 2024-04-05 DIAGNOSIS — R338 Other retention of urine: Secondary | ICD-10-CM

## 2024-04-05 DIAGNOSIS — R339 Retention of urine, unspecified: Secondary | ICD-10-CM | POA: Diagnosis not present

## 2024-04-05 HISTORY — PX: IR CYSTOSTOMY TUBE PLACEMENT/BLADDER ASPIRATION: IMG1097

## 2024-04-05 LAB — CBC
HCT: 39 % (ref 39.0–52.0)
Hemoglobin: 12.7 g/dL — ABNORMAL LOW (ref 13.0–17.0)
MCH: 31.7 pg (ref 26.0–34.0)
MCHC: 32.6 g/dL (ref 30.0–36.0)
MCV: 97.3 fL (ref 80.0–100.0)
Platelets: 330 10*3/uL (ref 150–400)
RBC: 4.01 MIL/uL — ABNORMAL LOW (ref 4.22–5.81)
RDW: 13.2 % (ref 11.5–15.5)
WBC: 6.5 10*3/uL (ref 4.0–10.5)
nRBC: 0 % (ref 0.0–0.2)

## 2024-04-05 LAB — PROTIME-INR
INR: 1.1 (ref 0.8–1.2)
Prothrombin Time: 14.3 s (ref 11.4–15.2)

## 2024-04-05 MED ORDER — SODIUM CHLORIDE 0.9% FLUSH: 5.0000 mL | Freq: Three times a day (TID) | INTRAVENOUS | Status: DC

## 2024-04-05 MED ORDER — MIDAZOLAM HCL 2 MG/2ML IJ SOLN
INTRAMUSCULAR | Status: AC | PRN
  Administered 2024-04-05: 1 mg via INTRAVENOUS
  Administered 2024-04-05: .5 mg via INTRAVENOUS

## 2024-04-05 MED ORDER — FENTANYL CITRATE (PF) 100 MCG/2ML IJ SOLN
INTRAMUSCULAR | Status: AC | PRN
  Administered 2024-04-05: 25 ug via INTRAVENOUS
  Administered 2024-04-05: 50 ug via INTRAVENOUS

## 2024-04-05 MED ORDER — LIDOCAINE HCL 1 % IJ SOLN
INTRAMUSCULAR | Status: AC
  Filled 2024-04-05: qty 20

## 2024-04-05 MED ORDER — LIDOCAINE HCL 1 % IJ SOLN
20.0000 mL | Freq: Once | INTRAMUSCULAR | Status: AC
  Administered 2024-04-05: 20 mL

## 2024-04-05 MED ORDER — FENTANYL CITRATE (PF) 100 MCG/2ML IJ SOLN
INTRAMUSCULAR | Status: AC
  Filled 2024-04-05: qty 2

## 2024-04-05 MED ORDER — SODIUM CHLORIDE 0.9 % IV SOLN: INTRAVENOUS | Status: DC

## 2024-04-05 MED ORDER — CHLORHEXIDINE GLUCONATE CLOTH 2 % EX PADS: 6.0000 | MEDICATED_PAD | Freq: Every day | CUTANEOUS | Status: DC

## 2024-04-05 MED ORDER — MIDAZOLAM HCL 2 MG/2ML IJ SOLN
INTRAMUSCULAR | Status: AC
  Filled 2024-04-05: qty 2

## 2024-04-05 MED ORDER — IOHEXOL 300 MG/ML  SOLN
50.0000 mL | Freq: Once | INTRAMUSCULAR | Status: AC | PRN
  Administered 2024-04-05: 20 mL

## 2024-04-05 MED ORDER — LIDOCAINE HCL 1 % IJ SOLN
INTRAMUSCULAR | Status: AC
Start: 2024-04-05 — End: 2024-04-05
  Filled 2024-04-05: qty 20

## 2024-04-05 NOTE — Procedures (Signed)
  Procedure:  FLuoro guided suprapubic bladder catheter 107F Council tip Preprocedure diagnosis: Diagnoses of Urinary retention, Neuromuscular dysfunction of bladder, and Acute urinary retention were pertinent to this visit. Postprocedure diagnosis: same EBL:    minimal Complications:   none immediate  See full dictation in [employer].  Nicky Barrack MD Main # [phone] Pager  [phone] Mobile [phone]

## 2024-04-05 NOTE — Progress Notes (Signed)
 Spoke with Mia, LPN at [facility] to review meds and NPO status.

## 2024-04-05 NOTE — Progress Notes (Signed)
 Report called and given to [facility], LPN at [facility].

## 2024-04-08 ENCOUNTER — Other Ambulatory Visit (HOSPITAL_COMMUNITY): Payer: Self-pay

## 2024-04-12 ENCOUNTER — Telehealth (HOSPITAL_COMMUNITY): Payer: Self-pay | Admitting: Emergency Medicine

## 2024-04-12 ENCOUNTER — Emergency Department (HOSPITAL_COMMUNITY)

## 2024-04-12 ENCOUNTER — Emergency Department (HOSPITAL_COMMUNITY)
Admission: EM | Admit: 2024-04-12 | Discharge: 2024-04-12 | Disposition: A | Discharge status: Skilled Nursing Facility | Attending: Emergency Medicine | Admitting: Emergency Medicine

## 2024-04-12 DIAGNOSIS — R339 Retention of urine, unspecified: Secondary | ICD-10-CM | POA: Diagnosis present

## 2024-04-12 DIAGNOSIS — R402411 Glasgow coma scale score 13-15, in the field [EMT or ambulance]: Secondary | ICD-10-CM | POA: Diagnosis not present

## 2024-04-12 DIAGNOSIS — Z7401 Bed confinement status: Secondary | ICD-10-CM | POA: Diagnosis not present

## 2024-04-12 DIAGNOSIS — T83090A Other mechanical complication of cystostomy catheter, initial encounter: Secondary | ICD-10-CM | POA: Diagnosis not present

## 2024-04-12 DIAGNOSIS — I959 Hypotension, unspecified: Secondary | ICD-10-CM | POA: Diagnosis not present

## 2024-04-12 DIAGNOSIS — T83098A Other mechanical complication of other indwelling urethral catheter, initial encounter: Secondary | ICD-10-CM | POA: Diagnosis not present

## 2024-04-12 DIAGNOSIS — Y732 Prosthetic and other implants, materials and accessory gastroenterology and urology devices associated with adverse incidents: Secondary | ICD-10-CM | POA: Insufficient documentation

## 2024-04-12 DIAGNOSIS — T83198A Other mechanical complication of other urinary devices and implants, initial encounter: Secondary | ICD-10-CM | POA: Diagnosis not present

## 2024-04-12 DIAGNOSIS — R404 Transient alteration of awareness: Secondary | ICD-10-CM | POA: Diagnosis not present

## 2024-04-12 DIAGNOSIS — T839XXA Unspecified complication of genitourinary prosthetic device, implant and graft, initial encounter: Secondary | ICD-10-CM

## 2024-04-12 HISTORY — PX: IR CYSTOSTOMY TUBE CHANGE COMPLICATED: IMG1098

## 2024-04-12 LAB — CBC WITH DIFFERENTIAL/PLATELET
Abs Immature Granulocytes: 0.04 10*3/uL (ref 0.00–0.07)
Basophils Absolute: 0.1 10*3/uL (ref 0.0–0.1)
Basophils Relative: 1 %
Eosinophils Absolute: 0.6 10*3/uL — ABNORMAL HIGH (ref 0.0–0.5)
Eosinophils Relative: 7 %
HCT: 39 % (ref 39.0–52.0)
Hemoglobin: 12.6 g/dL — ABNORMAL LOW (ref 13.0–17.0)
Immature Granulocytes: 0 %
Lymphocytes Relative: 14 %
Lymphs Abs: 1.3 10*3/uL (ref 0.7–4.0)
MCH: 32.1 pg (ref 26.0–34.0)
MCHC: 32.3 g/dL (ref 30.0–36.0)
MCV: 99.5 fL (ref 80.0–100.0)
Monocytes Absolute: 0.8 10*3/uL (ref 0.1–1.0)
Monocytes Relative: 9 %
Neutro Abs: 6.1 10*3/uL (ref 1.7–7.7)
Neutrophils Relative %: 69 %
Platelets: 356 10*3/uL (ref 150–400)
RBC: 3.92 MIL/uL — ABNORMAL LOW (ref 4.22–5.81)
RDW: 13.3 % (ref 11.5–15.5)
WBC: 8.9 10*3/uL (ref 4.0–10.5)
nRBC: 0 % (ref 0.0–0.2)

## 2024-04-12 LAB — URINALYSIS, ROUTINE W REFLEX MICROSCOPIC
Bilirubin Urine: NEGATIVE
Glucose, UA: NEGATIVE mg/dL
Hgb urine dipstick: NEGATIVE
Ketones, ur: NEGATIVE mg/dL
Nitrite: NEGATIVE
Protein, ur: 100 mg/dL — AB
Specific Gravity, Urine: 1.021 (ref 1.005–1.030)
pH: 8 (ref 5.0–8.0)

## 2024-04-12 LAB — BASIC METABOLIC PANEL WITH GFR
Anion gap: 8 (ref 5–15)
BUN: 27 mg/dL — ABNORMAL HIGH (ref 8–23)
CO2: 26 mmol/L (ref 22–32)
Calcium: 8.9 mg/dL (ref 8.9–10.3)
Chloride: 106 mmol/L (ref 98–111)
Creatinine, Ser: 1.2 mg/dL (ref 0.61–1.24)
GFR, Estimated: 58 mL/min — ABNORMAL LOW (ref >60)
Glucose, Bld: 111 mg/dL — ABNORMAL HIGH (ref 70–99)
Potassium: 3.7 mmol/L (ref 3.5–5.1)
Sodium: 140 mmol/L (ref 135–145)

## 2024-04-12 MED ORDER — SODIUM CHLORIDE 0.9 % IV SOLN
1.0000 g | Freq: Once | INTRAVENOUS | Status: AC
  Administered 2024-04-12: 1 g via INTRAVENOUS
  Filled 2024-04-12: qty 10

## 2024-04-12 MED ORDER — LIDOCAINE VISCOUS HCL 2 % MT SOLN
15.0000 mL | Freq: Once | OROMUCOSAL | Status: AC
  Administered 2024-04-12: 7 mL via OROMUCOSAL
  Filled 2024-04-12: qty 15

## 2024-04-12 MED ORDER — IOHEXOL 300 MG/ML  SOLN
50.0000 mL | Freq: Once | INTRAMUSCULAR | Status: AC | PRN
  Administered 2024-04-12: 15 mL

## 2024-04-12 MED ORDER — CEPHALEXIN 500 MG PO CAPS: 500.0000 mg | ORAL_CAPSULE | Freq: Three times a day (TID) | ORAL | 0 refills | Status: AC

## 2024-04-12 MED ORDER — LIDOCAINE VISCOUS HCL 2 % MT SOLN
OROMUCOSAL | Status: AC
  Filled 2024-04-12: qty 15

## 2024-04-12 NOTE — ED Provider Notes (Signed)
 Reassessed this patient after return from IR, he states that he is feeling well and has no further concerns at this time.  He is resting comfortably in bed has no further complaints at this time.  As such plan to discharge patient, suprapubic cath successfully exchanged as noted in IR procedure notes.  Independently evaluated the abdomen, abdomen is normal-appearing, suprapubic catheter in place.   Myriam Dorn BROCKS, PA 04/12/24 1038    Charlyn Sora, MD 04/12/24 1158

## 2024-04-12 NOTE — ED Triage Notes (Signed)
 Pt comes via [facility] from [facility], pt has a suprapubic cath that was placed last Thursday and has been having minimal output all night.

## 2024-04-12 NOTE — ED Notes (Signed)
 [facility] called

## 2024-04-12 NOTE — ED Provider Notes (Signed)
 [facility]-EMERGENCY DEPT [facility] Emergency Department Provider Note MRN:  [id number]  Arrival date & time: 04/12/24     Chief Complaint   Urinary Retention   History of Present Illness   Raymond Cuevas is a 89 y.o. year-old male presents to the ED with chief complaint of suprapubic catheter problem.  He had a suprapubic catheter placed 7 days ago for urinary retention.  He is not sure why he has it.  There is no recent documentation as to what the cause of his retention is.  Per epic notes, he had had a Foley catheter prior to the suprapubic catheter.  He reports abdominal pain.  He denies fevers..  History provided by patient.   Review of Systems  Pertinent positive and negative review of systems noted in HPI.    Physical Exam   Vitals:   04/12/24 0513 04/12/24 0527  BP:  (!) 145/53  Pulse: (!) 55 (!) 54  Resp:  20  Temp:  97.7 F (36.5 C)  SpO2: 97% 98%    CONSTITUTIONAL:  non toxic-appearing, NAD NEURO:  Alert and oriented x 3, CN 3-12 grossly intact EYES:  eyes equal and reactive ENT/NECK:  Supple, no stridor  CARDIO:  normal rate, regular rhythm, appears well-perfused  PULM:  No respiratory distress,  GI/GU:  non-distended, suprapubic catheter in place, not draining, lower abdomen TTP MSK/SPINE:  No gross deformities, no edema, moves all extremities  SKIN:  no rash, atraumatic   *Additional and/or pertinent findings included in MDM below  Diagnostic and Interventional Summary    EKG Interpretation Date/Time:    Ventricular Rate:    PR Interval:    QRS Duration:    QT Interval:    QTC Calculation:   R Axis:      Text Interpretation:         Labs Reviewed  CBC WITH DIFFERENTIAL/PLATELET - Abnormal; Notable for the following components:      Result Value   RBC 3.92 (*)    Hemoglobin 12.6 (*)    Eosinophils Absolute 0.6 (*)    All other components within normal limits  BASIC METABOLIC PANEL WITH GFR - Abnormal; Notable for the following  components:   Glucose, Bld 111 (*)    BUN 27 (*)    GFR, Estimated 58 (*)    All other components within normal limits  URINALYSIS, ROUTINE W REFLEX MICROSCOPIC - Abnormal; Notable for the following components:   APPearance TURBID (*)    Protein, ur 100 (*)    Leukocytes,Ua MODERATE (*)    Bacteria, UA MANY (*)    All other components within normal limits    No orders to display    Medications  cefTRIAXone (ROCEPHIN) 1 g in sodium chloride  0.9 % 100 mL IVPB (1 g Intravenous New Bag/Given 04/12/24 9487)     Procedures  /  Critical Care Procedures  ED Course and Medical Decision Making  I have reviewed the triage vital signs, the nursing notes, and pertinent available records from the EMR.  Social Determinants Affecting Complexity of Care: Patient has no clinically significant social determinants affecting this chief complaint..   ED Course:    Medical Decision Making Patient here with suprapubic catheter problem.  It is not draining.  He states that it has not been draining for a couple of days, but I do not think he is a good historian or has good insight into his condition.  Bladder scan showed 0 mL, I did put an ultrasound  on the patient and I was able to see a small amount of urine in the bladder along with the balloon from the suprapubic catheter which seemed well-positioned.  We were unable to irrigate the suprapubic catheter.  Patient does not have any evidence of acute kidney injury.  He does not appear toxic.  He does not appear uncomfortable.  Will consult with IR for reassessment and exchange of suprapubic catheter.  Amount and/or Complexity of Data Reviewed Labs: ordered.  Risk Decision regarding hospitalization.         Consultants: I consulted with Dr. Jesusa, who recommends holding off on foley placement unless concern for significant retention.  Agrees with plan for IR reassessment and exchange of suprapubic cath.  I consulted with Dr. Jennefer, IR,  who will have the morning team see the  patient.  Treatment and Plan: To IR in AM for catheter exchange.  NPO.    Final Clinical Impressions(s) / ED Diagnoses     ICD-10-CM   1. Problem with urinary catheter (HCC)  [email or web address]       ED Discharge Orders     None         Discharge Instructions Discussed with and Provided to Patient:   Discharge Instructions   None      Vicky Charleston, PA-C 04/12/24 0542    Carita Senior, MD 04/12/24 [phone]

## 2024-04-12 NOTE — ED Notes (Signed)
 Report called to chelsea at facility.

## 2024-04-12 NOTE — Procedures (Addendum)
 IR PROCEDURE NOTE:   Procedure:                       FLuoro guided suprapubic bladder catheter exchange 36F Council tip Preprocedure diagnosis:  Diagnoses of Urinary retention, Neuromuscular dysfunction of bladder, and Acute urinary retention were pertinent to this visit. Postprocedure diagnosis:same EBL:                                 minimal Complications:                 none immediate   See full dictation in [employer].    Electronically Signed: Carlin DELENA Griffon, PA-C 04/12/2024, 9:53 AM

## 2024-04-12 NOTE — Telephone Encounter (Signed)
 Add on urine culture and empiric antibiotics.

## 2024-04-12 NOTE — Discharge Instructions (Signed)
 Please follow-up with your primary care provider within the next week to 2 weeks for continued following of your condition.

## 2024-04-16 DIAGNOSIS — E440 Moderate protein-calorie malnutrition: Secondary | ICD-10-CM | POA: Diagnosis not present

## 2024-04-16 DIAGNOSIS — Z515 Encounter for palliative care: Secondary | ICD-10-CM | POA: Diagnosis not present

## 2024-04-16 DIAGNOSIS — F03C0 Unspecified dementia, severe, without behavioral disturbance, psychotic disturbance, mood disturbance, and anxiety: Secondary | ICD-10-CM | POA: Diagnosis not present

## 2024-05-02 DIAGNOSIS — N319 Neuromuscular dysfunction of bladder, unspecified: Secondary | ICD-10-CM | POA: Diagnosis not present

## 2024-05-02 DIAGNOSIS — R338 Other retention of urine: Secondary | ICD-10-CM | POA: Diagnosis not present

## 2024-05-08 DIAGNOSIS — I739 Peripheral vascular disease, unspecified: Secondary | ICD-10-CM | POA: Diagnosis not present

## 2024-05-08 DIAGNOSIS — L602 Onychogryphosis: Secondary | ICD-10-CM | POA: Diagnosis not present

## 2024-05-17 ENCOUNTER — Other Ambulatory Visit: Payer: Self-pay

## 2024-05-17 ENCOUNTER — Emergency Department (HOSPITAL_COMMUNITY)
Admission: EM | Admit: 2024-05-17 | Discharge: 2024-05-17 | Disposition: A | Discharge status: Home / Self Care | Source: Skilled Nursing Facility | Attending: Emergency Medicine | Admitting: Emergency Medicine

## 2024-05-17 ENCOUNTER — Encounter (HOSPITAL_COMMUNITY): Payer: Self-pay

## 2024-05-17 DIAGNOSIS — T83091A Other mechanical complication of indwelling urethral catheter, initial encounter: Secondary | ICD-10-CM

## 2024-05-17 DIAGNOSIS — R339 Retention of urine, unspecified: Secondary | ICD-10-CM | POA: Diagnosis present

## 2024-05-17 DIAGNOSIS — R21 Rash and other nonspecific skin eruption: Secondary | ICD-10-CM | POA: Insufficient documentation

## 2024-05-17 DIAGNOSIS — R531 Weakness: Secondary | ICD-10-CM | POA: Diagnosis not present

## 2024-05-17 DIAGNOSIS — R404 Transient alteration of awareness: Secondary | ICD-10-CM | POA: Diagnosis not present

## 2024-05-17 DIAGNOSIS — Z79899 Other long term (current) drug therapy: Secondary | ICD-10-CM | POA: Insufficient documentation

## 2024-05-17 DIAGNOSIS — T83098A Other mechanical complication of other indwelling urethral catheter, initial encounter: Secondary | ICD-10-CM | POA: Insufficient documentation

## 2024-05-17 DIAGNOSIS — Z7982 Long term (current) use of aspirin: Secondary | ICD-10-CM | POA: Insufficient documentation

## 2024-05-17 DIAGNOSIS — Z7401 Bed confinement status: Secondary | ICD-10-CM | POA: Diagnosis not present

## 2024-05-17 LAB — CBC WITH DIFFERENTIAL/PLATELET
Abs Immature Granulocytes: 0.04 K/uL (ref 0.00–0.07)
Basophils Absolute: 0.1 K/uL (ref 0.0–0.1)
Basophils Relative: 1 %
Eosinophils Absolute: 0.6 K/uL — ABNORMAL HIGH (ref 0.0–0.5)
Eosinophils Relative: 7 %
HCT: 37.4 % — ABNORMAL LOW (ref 39.0–52.0)
Hemoglobin: 12 g/dL — ABNORMAL LOW (ref 13.0–17.0)
Immature Granulocytes: 1 %
Lymphocytes Relative: 23 %
Lymphs Abs: 1.9 K/uL (ref 0.7–4.0)
MCH: 31.6 pg (ref 26.0–34.0)
MCHC: 32.1 g/dL (ref 30.0–36.0)
MCV: 98.4 fL (ref 80.0–100.0)
Monocytes Absolute: 0.8 K/uL (ref 0.1–1.0)
Monocytes Relative: 10 %
Neutro Abs: 4.7 K/uL (ref 1.7–7.7)
Neutrophils Relative %: 58 %
Platelets: 326 K/uL (ref 150–400)
RBC: 3.8 MIL/uL — ABNORMAL LOW (ref 4.22–5.81)
RDW: 14.3 % (ref 11.5–15.5)
WBC: 8.1 K/uL (ref 4.0–10.5)
nRBC: 0 % (ref 0.0–0.2)

## 2024-05-17 LAB — BASIC METABOLIC PANEL WITH GFR
Anion gap: 10 (ref 5–15)
BUN: 18 mg/dL (ref 8–23)
CO2: 25 mmol/L (ref 22–32)
Calcium: 9.4 mg/dL (ref 8.9–10.3)
Chloride: 105 mmol/L (ref 98–111)
Creatinine, Ser: 1.11 mg/dL (ref 0.61–1.24)
GFR, Estimated: >60 mL/min (ref >60)
Glucose, Bld: 102 mg/dL — ABNORMAL HIGH (ref 70–99)
Potassium: 3.7 mmol/L (ref 3.5–5.1)
Sodium: 140 mmol/L (ref 135–145)

## 2024-05-17 NOTE — ED Notes (Signed)
 [facility] called for transport.

## 2024-05-17 NOTE — ED Notes (Signed)
 Pt's catheter was irrigated with 100mL of NS without difficulty and returned of urine. Provider aware

## 2024-05-17 NOTE — ED Triage Notes (Signed)
 Coming by Raymond Cuevas, from [facility]. PT had suprapubic catheter placed on June 2025.  Complaints today of little output after change of bag at 1600. Concerns from obstruction or dislodgment.   Pt aox3, confused baseline, denies pain.

## 2024-05-17 NOTE — ED Notes (Signed)
 Pt transported to [facility] by [facility]. Iv removed. Pt reports no pain. Discharge paperwork placed in packet for receiving facility.

## 2024-05-17 NOTE — ED Provider Notes (Signed)
  EMERGENCY DEPARTMENT AT [facility] Provider Note   CSN: [id number] Arrival date & time: 05/17/24  0125     Patient presents with: Urinary Retention   Raymond Cuevas is a 89 y.o. male.   Patient sent to the emergency department from nursing home for evaluation of suprapubic catheter.  Patient with history of neurogenic bladder, had suprapubic catheter placed by IR on June 20, replaced by IR on June 27.  Nursing home reports decreased urine output.       Prior to Admission medications   Medication Sig Start Date End Date Taking? Authorizing Provider  acetaminophen  (TYLENOL ) 500 MG tablet Take 500-1,000 mg by mouth every 6 (six) hours as needed for mild pain or headache.    [provider]  albuterol  (VENTOLIN  HFA) 108 (90 Base) MCG/ACT inhaler INHALE 2 PUFFS BY MOUTH AS NEEDED 4 TIMES DAILY UNTIL WHEEZING EXTINGUISHED. RINSE AND SPIT AFTER USE. DEMONSTRATE PROPER TECHNIQUE INHALATI Patient taking differently: Inhale 2 puffs into the lungs 4 (four) times daily as needed (until wheezing is extinguished- rinse and spit after each use). 08/18/21   Mannam, Praveen, MD  Ascorbic Acid (VITAMIN C) 1000 MG tablet Take 1,000 mg by mouth daily.    [provider]  aspirin  EC 81 MG tablet Take 81 mg by mouth daily.    [provider]  Calcium  Carb-Cholecalciferol 600-20 MG-MCG TABS Take 1 tablet by mouth daily.    [provider]  cephALEXin  (KEFLEX ) 500 MG capsule Take 1 capsule (500 mg total) by mouth 3 (three) times daily. 04/12/24   Rancour, Garnette, MD  finasteride  (PROSCAR ) 5 MG tablet Take 1 tablet (5 mg total) by mouth daily. 03/29/23   Samtani, Jai-Gurmukh, MD  HYDROcodone -acetaminophen  (NORCO/VICODIN) 5-325 MG tablet Take 1-2 tablets by mouth every 4 (four) hours as needed for moderate pain. 03/28/23   Samtani, Jai-Gurmukh, MD  levothyroxine  (SYNTHROID ) 25 MCG tablet Take 25 mcg by mouth daily before breakfast. 07/06/21   [provider]  magnesium  hydroxide (MILK OF MAGNESIA) 400 MG/5ML suspension Take 30 mLs by mouth daily as needed for moderate constipation or heartburn.    [provider]  omeprazole  (PRILOSEC  OTC) 20 MG tablet Take 20 mg by mouth daily.    [provider]  tamsulosin  (FLOMAX ) 0.4 MG CAPS capsule Take 1 capsule (0.4 mg) by mouth daily after supper. 03/28/23   Samtani, Jai-Gurmukh, MD  vitamin B-12 (CYANOCOBALAMIN) 1000 MCG tablet Take 1,000 mcg by mouth daily.    [provider]    Allergies: Codeine, Morphine and codeine, and Sulfa antibiotics    Review of Systems  Updated Vital Signs BP (!) 158/66   Pulse 60   Temp 98.2 F (36.8 C) (Oral)   Resp 15   SpO2 99%   Physical Exam Vitals and nursing note reviewed.  Constitutional:      General: He is not in acute distress.    Appearance: He is well-developed.  HENT:     Head: Normocephalic and atraumatic.     Mouth/Throat:     Mouth: Mucous membranes are moist.  Eyes:     General: Vision grossly intact. Gaze aligned appropriately.     Extraocular Movements: Extraocular movements intact.     Conjunctiva/sclera: Conjunctivae normal.  Cardiovascular:     Rate and Rhythm: Normal rate and regular rhythm.     Pulses: Normal pulses.     Heart sounds: Normal heart sounds, S1 normal and S2 normal. No murmur heard.  No friction rub. No gallop.  Pulmonary:     Effort: Pulmonary effort is normal. No respiratory distress.     Breath sounds: Normal breath sounds.  Abdominal:     Palpations: Abdomen is soft.     Tenderness: There is no abdominal tenderness. There is no guarding or rebound.     Hernia: No hernia is present.  Musculoskeletal:        General: No swelling.     Cervical back: Full passive range of motion without pain, normal range of motion and neck supple. No pain with movement, spinous process tenderness or muscular tenderness. Normal range of motion.     Right lower leg: No edema.     Left  lower leg: No edema.  Skin:    General: Skin is warm and dry.     Capillary Refill: Capillary refill takes less than 2 seconds.     Findings: Rash (Candidal rash under abdominal pannus) present. No ecchymosis, erythema, lesion or wound.  Neurological:     Mental Status: He is alert. Mental status is at baseline.     GCS: GCS eye subscore is 4. GCS verbal subscore is 5. GCS motor subscore is 6.     Cranial Nerves: Cranial nerves 2-12 are intact.     Sensory: Sensation is intact.     Motor: Motor function is intact. No weakness or abnormal muscle tone.     Coordination: Coordination is intact.  Psychiatric:        Mood and Affect: Mood normal.        Speech: Speech normal.        Behavior: Behavior normal.     (all labs ordered are listed, but only abnormal results are displayed) Labs Reviewed  CBC WITH DIFFERENTIAL/PLATELET - Abnormal; Notable for the following components:      Result Value   RBC 3.80 (*)    Hemoglobin 12.0 (*)    HCT 37.4 (*)    Eosinophils Absolute 0.6 (*)    All other components within normal limits  BASIC METABOLIC PANEL WITH GFR - Abnormal; Notable for the following components:   Glucose, Bld 102 (*)    All other components within normal limits    EKG: None  Radiology: No results found.   Procedures   Medications Ordered in the ED - No data to display                                  Medical Decision Making Amount and/or Complexity of Data Reviewed Labs: ordered.   Sent to the emergency department for evaluation of decreased urine output and suprapubic catheter bag.  Catheter dysfunction, dehydration, renal failure all considered.  Basic labs unremarkable, normal BUN and creatinine.  Catheter was irrigated.  100 mL was put into the bladder and 300 returned.  There was initially a little resistance, it appears that the catheter was blocked but is now functioning properly.  Patient will be appropriate for return to nursing home.     Final  diagnoses:  Obstruction of Foley catheter, initial encounter [facility])    ED Discharge Orders     None          Haze Lonni PARAS, MD 05/17/24 [phone]

## 2024-05-17 NOTE — Discharge Instructions (Signed)
 It appears that the catheter was partially blocked.  This has been cleared and is freely draining.  Labs are unremarkable.

## 2024-05-18 DIAGNOSIS — N320 Bladder-neck obstruction: Secondary | ICD-10-CM | POA: Diagnosis not present

## 2024-05-18 DIAGNOSIS — Z960 Presence of urogenital implants: Secondary | ICD-10-CM | POA: Diagnosis not present

## 2024-05-18 DIAGNOSIS — I350 Nonrheumatic aortic (valve) stenosis: Secondary | ICD-10-CM | POA: Diagnosis not present

## 2024-05-21 DIAGNOSIS — N179 Acute kidney failure, unspecified: Secondary | ICD-10-CM | POA: Diagnosis not present

## 2024-05-21 DIAGNOSIS — R278 Other lack of coordination: Secondary | ICD-10-CM | POA: Diagnosis not present

## 2024-05-21 DIAGNOSIS — M6281 Muscle weakness (generalized): Secondary | ICD-10-CM | POA: Diagnosis not present

## 2024-05-21 DIAGNOSIS — Z9181 History of falling: Secondary | ICD-10-CM | POA: Diagnosis not present

## 2024-05-21 DIAGNOSIS — R4189 Other symptoms and signs involving cognitive functions and awareness: Secondary | ICD-10-CM | POA: Diagnosis not present

## 2024-05-22 DIAGNOSIS — R4189 Other symptoms and signs involving cognitive functions and awareness: Secondary | ICD-10-CM | POA: Diagnosis not present

## 2024-05-22 DIAGNOSIS — R278 Other lack of coordination: Secondary | ICD-10-CM | POA: Diagnosis not present

## 2024-05-22 DIAGNOSIS — Z9181 History of falling: Secondary | ICD-10-CM | POA: Diagnosis not present

## 2024-05-22 DIAGNOSIS — M6281 Muscle weakness (generalized): Secondary | ICD-10-CM | POA: Diagnosis not present

## 2024-05-22 DIAGNOSIS — N179 Acute kidney failure, unspecified: Secondary | ICD-10-CM | POA: Diagnosis not present

## 2024-05-23 DIAGNOSIS — R4189 Other symptoms and signs involving cognitive functions and awareness: Secondary | ICD-10-CM | POA: Diagnosis not present

## 2024-05-23 DIAGNOSIS — M6281 Muscle weakness (generalized): Secondary | ICD-10-CM | POA: Diagnosis not present

## 2024-05-23 DIAGNOSIS — E440 Moderate protein-calorie malnutrition: Secondary | ICD-10-CM | POA: Diagnosis not present

## 2024-05-23 DIAGNOSIS — Z515 Encounter for palliative care: Secondary | ICD-10-CM | POA: Diagnosis not present

## 2024-05-23 DIAGNOSIS — R278 Other lack of coordination: Secondary | ICD-10-CM | POA: Diagnosis not present

## 2024-05-23 DIAGNOSIS — N179 Acute kidney failure, unspecified: Secondary | ICD-10-CM | POA: Diagnosis not present

## 2024-05-23 DIAGNOSIS — Z9181 History of falling: Secondary | ICD-10-CM | POA: Diagnosis not present

## 2024-05-23 DIAGNOSIS — F03C0 Unspecified dementia, severe, without behavioral disturbance, psychotic disturbance, mood disturbance, and anxiety: Secondary | ICD-10-CM | POA: Diagnosis not present

## 2024-05-24 DIAGNOSIS — Z9181 History of falling: Secondary | ICD-10-CM | POA: Diagnosis not present

## 2024-05-24 DIAGNOSIS — R278 Other lack of coordination: Secondary | ICD-10-CM | POA: Diagnosis not present

## 2024-05-24 DIAGNOSIS — R4189 Other symptoms and signs involving cognitive functions and awareness: Secondary | ICD-10-CM | POA: Diagnosis not present

## 2024-05-24 DIAGNOSIS — M6281 Muscle weakness (generalized): Secondary | ICD-10-CM | POA: Diagnosis not present

## 2024-05-24 DIAGNOSIS — N179 Acute kidney failure, unspecified: Secondary | ICD-10-CM | POA: Diagnosis not present

## 2024-05-27 DIAGNOSIS — Z9181 History of falling: Secondary | ICD-10-CM | POA: Diagnosis not present

## 2024-05-27 DIAGNOSIS — R278 Other lack of coordination: Secondary | ICD-10-CM | POA: Diagnosis not present

## 2024-05-27 DIAGNOSIS — N179 Acute kidney failure, unspecified: Secondary | ICD-10-CM | POA: Diagnosis not present

## 2024-05-27 DIAGNOSIS — M6281 Muscle weakness (generalized): Secondary | ICD-10-CM | POA: Diagnosis not present

## 2024-05-27 DIAGNOSIS — R4189 Other symptoms and signs involving cognitive functions and awareness: Secondary | ICD-10-CM | POA: Diagnosis not present

## 2024-05-29 DIAGNOSIS — R278 Other lack of coordination: Secondary | ICD-10-CM | POA: Diagnosis not present

## 2024-05-29 DIAGNOSIS — Z9181 History of falling: Secondary | ICD-10-CM | POA: Diagnosis not present

## 2024-05-29 DIAGNOSIS — R4189 Other symptoms and signs involving cognitive functions and awareness: Secondary | ICD-10-CM | POA: Diagnosis not present

## 2024-05-29 DIAGNOSIS — M6281 Muscle weakness (generalized): Secondary | ICD-10-CM | POA: Diagnosis not present

## 2024-05-29 DIAGNOSIS — N179 Acute kidney failure, unspecified: Secondary | ICD-10-CM | POA: Diagnosis not present

## 2024-05-30 DIAGNOSIS — N179 Acute kidney failure, unspecified: Secondary | ICD-10-CM | POA: Diagnosis not present

## 2024-05-30 DIAGNOSIS — M6281 Muscle weakness (generalized): Secondary | ICD-10-CM | POA: Diagnosis not present

## 2024-05-30 DIAGNOSIS — Z9181 History of falling: Secondary | ICD-10-CM | POA: Diagnosis not present

## 2024-05-30 DIAGNOSIS — R4189 Other symptoms and signs involving cognitive functions and awareness: Secondary | ICD-10-CM | POA: Diagnosis not present

## 2024-05-30 DIAGNOSIS — R278 Other lack of coordination: Secondary | ICD-10-CM | POA: Diagnosis not present

## 2024-06-04 DIAGNOSIS — N179 Acute kidney failure, unspecified: Secondary | ICD-10-CM | POA: Diagnosis not present

## 2024-06-04 DIAGNOSIS — R278 Other lack of coordination: Secondary | ICD-10-CM | POA: Diagnosis not present

## 2024-06-04 DIAGNOSIS — R4189 Other symptoms and signs involving cognitive functions and awareness: Secondary | ICD-10-CM | POA: Diagnosis not present

## 2024-06-04 DIAGNOSIS — Z9181 History of falling: Secondary | ICD-10-CM | POA: Diagnosis not present

## 2024-06-04 DIAGNOSIS — M6281 Muscle weakness (generalized): Secondary | ICD-10-CM | POA: Diagnosis not present

## 2024-06-05 DIAGNOSIS — R4189 Other symptoms and signs involving cognitive functions and awareness: Secondary | ICD-10-CM | POA: Diagnosis not present

## 2024-06-05 DIAGNOSIS — R278 Other lack of coordination: Secondary | ICD-10-CM | POA: Diagnosis not present

## 2024-06-05 DIAGNOSIS — N179 Acute kidney failure, unspecified: Secondary | ICD-10-CM | POA: Diagnosis not present

## 2024-06-05 DIAGNOSIS — Z9181 History of falling: Secondary | ICD-10-CM | POA: Diagnosis not present

## 2024-06-05 DIAGNOSIS — M6281 Muscle weakness (generalized): Secondary | ICD-10-CM | POA: Diagnosis not present

## 2024-06-05 DIAGNOSIS — R338 Other retention of urine: Secondary | ICD-10-CM | POA: Diagnosis not present

## 2024-06-10 DIAGNOSIS — N179 Acute kidney failure, unspecified: Secondary | ICD-10-CM | POA: Diagnosis not present

## 2024-06-10 DIAGNOSIS — R278 Other lack of coordination: Secondary | ICD-10-CM | POA: Diagnosis not present

## 2024-06-10 DIAGNOSIS — Z9181 History of falling: Secondary | ICD-10-CM | POA: Diagnosis not present

## 2024-06-10 DIAGNOSIS — M6281 Muscle weakness (generalized): Secondary | ICD-10-CM | POA: Diagnosis not present

## 2024-06-10 DIAGNOSIS — R4189 Other symptoms and signs involving cognitive functions and awareness: Secondary | ICD-10-CM | POA: Diagnosis not present

## 2024-06-12 DIAGNOSIS — R4189 Other symptoms and signs involving cognitive functions and awareness: Secondary | ICD-10-CM | POA: Diagnosis not present

## 2024-06-12 DIAGNOSIS — R278 Other lack of coordination: Secondary | ICD-10-CM | POA: Diagnosis not present

## 2024-06-12 DIAGNOSIS — M6281 Muscle weakness (generalized): Secondary | ICD-10-CM | POA: Diagnosis not present

## 2024-06-12 DIAGNOSIS — N179 Acute kidney failure, unspecified: Secondary | ICD-10-CM | POA: Diagnosis not present

## 2024-06-12 DIAGNOSIS — Z9181 History of falling: Secondary | ICD-10-CM | POA: Diagnosis not present

## 2024-06-14 DIAGNOSIS — Z9181 History of falling: Secondary | ICD-10-CM | POA: Diagnosis not present

## 2024-06-14 DIAGNOSIS — R278 Other lack of coordination: Secondary | ICD-10-CM | POA: Diagnosis not present

## 2024-06-14 DIAGNOSIS — M6281 Muscle weakness (generalized): Secondary | ICD-10-CM | POA: Diagnosis not present

## 2024-06-14 DIAGNOSIS — R4189 Other symptoms and signs involving cognitive functions and awareness: Secondary | ICD-10-CM | POA: Diagnosis not present

## 2024-06-14 DIAGNOSIS — N179 Acute kidney failure, unspecified: Secondary | ICD-10-CM | POA: Diagnosis not present

## 2024-06-19 DIAGNOSIS — R278 Other lack of coordination: Secondary | ICD-10-CM | POA: Diagnosis not present

## 2024-06-19 DIAGNOSIS — M6281 Muscle weakness (generalized): Secondary | ICD-10-CM | POA: Diagnosis not present

## 2024-06-19 DIAGNOSIS — R4189 Other symptoms and signs involving cognitive functions and awareness: Secondary | ICD-10-CM | POA: Diagnosis not present

## 2024-06-19 DIAGNOSIS — Z9181 History of falling: Secondary | ICD-10-CM | POA: Diagnosis not present

## 2024-06-19 DIAGNOSIS — N179 Acute kidney failure, unspecified: Secondary | ICD-10-CM | POA: Diagnosis not present

## 2024-06-20 DIAGNOSIS — H04123 Dry eye syndrome of bilateral lacrimal glands: Secondary | ICD-10-CM | POA: Diagnosis not present

## 2024-06-20 DIAGNOSIS — Z961 Presence of intraocular lens: Secondary | ICD-10-CM | POA: Diagnosis not present

## 2024-06-22 ENCOUNTER — Emergency Department (HOSPITAL_COMMUNITY)
Admission: EM | Admit: 2024-06-22 | Discharge: 2024-06-22 | Disposition: A | Discharge status: Hospice | Attending: Emergency Medicine | Admitting: Emergency Medicine

## 2024-06-22 ENCOUNTER — Other Ambulatory Visit: Payer: Self-pay

## 2024-06-22 ENCOUNTER — Encounter (HOSPITAL_COMMUNITY): Payer: Self-pay | Admitting: Emergency Medicine

## 2024-06-22 DIAGNOSIS — R4182 Altered mental status, unspecified: Secondary | ICD-10-CM | POA: Diagnosis not present

## 2024-06-22 DIAGNOSIS — T83091A Other mechanical complication of indwelling urethral catheter, initial encounter: Secondary | ICD-10-CM | POA: Diagnosis not present

## 2024-06-22 DIAGNOSIS — T83098A Other mechanical complication of other indwelling urethral catheter, initial encounter: Secondary | ICD-10-CM | POA: Insufficient documentation

## 2024-06-22 DIAGNOSIS — Y732 Prosthetic and other implants, materials and accessory gastroenterology and urology devices associated with adverse incidents: Secondary | ICD-10-CM | POA: Diagnosis not present

## 2024-06-22 DIAGNOSIS — R339 Retention of urine, unspecified: Secondary | ICD-10-CM | POA: Insufficient documentation

## 2024-06-22 DIAGNOSIS — R102 Pelvic and perineal pain: Secondary | ICD-10-CM | POA: Diagnosis not present

## 2024-06-22 DIAGNOSIS — R1084 Generalized abdominal pain: Secondary | ICD-10-CM | POA: Diagnosis not present

## 2024-06-22 DIAGNOSIS — F0390 Unspecified dementia without behavioral disturbance: Secondary | ICD-10-CM | POA: Insufficient documentation

## 2024-06-22 DIAGNOSIS — J449 Chronic obstructive pulmonary disease, unspecified: Secondary | ICD-10-CM | POA: Insufficient documentation

## 2024-06-22 DIAGNOSIS — Z7982 Long term (current) use of aspirin: Secondary | ICD-10-CM | POA: Insufficient documentation

## 2024-06-22 DIAGNOSIS — I10 Essential (primary) hypertension: Secondary | ICD-10-CM | POA: Insufficient documentation

## 2024-06-22 DIAGNOSIS — T839XXA Unspecified complication of genitourinary prosthetic device, implant and graft, initial encounter: Secondary | ICD-10-CM

## 2024-06-22 DIAGNOSIS — Z7401 Bed confinement status: Secondary | ICD-10-CM | POA: Diagnosis not present

## 2024-06-22 NOTE — ED Notes (Signed)
 [facility] contacted for transport

## 2024-06-22 NOTE — ED Provider Notes (Signed)
 [place] EMERGENCY DEPARTMENT AT [facility] Provider Note  CSN: [id number] Arrival date & time: 06/22/24 9781  Chief Complaint(s) No chief complaint on file.  HPI Raymond Cuevas is a 89 y.o. male with a past medical history listed below including need for suprapubic catheter here for replacement of catheter.  Staff at the facility were unable to replace his catheter this evening.  In place is a 48 French catheter only inserted 2 to 3 cm within the lumen.  Patient is complaining of suprapubic discomfort stating that it feels like he is have to pee.  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 => TTE 02/02/2022: EF> 75%.  Moderate LVH.  GR 1 DD.  Normal RV and RVP.  Severe AS.  Mean gradient 44 mmHg up from 37 mmHg   Patient Active Problem List   Diagnosis Date Noted   Malnutrition of moderate degree 03/24/2023   Rhabdomyolysis 03/23/2023   Dementia without behavioral disturbance (HCC) 03/23/2023   Hypokalemia 03/23/2023   Acute urinary retention 03/23/2023   Elevated LFTs 03/23/2023   AKI (acute kidney injury) (HCC) 03/23/2023   Goals of care, counseling/discussion 02/28/2022   Severe aortic stenosis by prior echocardiogram 02/02/2022   Hyperlipidemia LDL goal <100 09/25/2020   Dyspnea on exertion 01/21/2017   Essential hypertension 01/21/2017   Morbid obesity (HCC) 08/28/2013   S/P right THA, AA 08/27/2013   Home Medication(s) Prior to Admission medications   Medication Sig Start Date End Date Taking? Authorizing Provider  acetaminophen   (TYLENOL ) 500 MG tablet Take 500-1,000 mg by mouth every 6 (six) hours as needed for mild pain or headache.    [provider]  albuterol  (VENTOLIN  HFA) 108 (90 Base) MCG/ACT inhaler INHALE 2 PUFFS BY MOUTH AS NEEDED 4 TIMES DAILY UNTIL WHEEZING EXTINGUISHED. RINSE AND SPIT AFTER USE. DEMONSTRATE PROPER TECHNIQUE INHALATI Patient taking differently: Inhale 2 puffs into the lungs 4 (four) times daily as needed (until wheezing is extinguished- rinse and spit after each use). 08/18/21   Mannam, Praveen, MD  Ascorbic Acid (VITAMIN C) 1000 MG tablet Take 1,000 mg by mouth daily.    [provider]  aspirin  EC 81 MG tablet Take 81 mg by mouth daily.    [provider]  Calcium  Carb-Cholecalciferol 600-20 MG-MCG TABS Take 1 tablet by mouth daily.    [provider]  cephALEXin  (KEFLEX ) 500 MG capsule Take 1 capsule (500 mg total) by mouth 3 (three) times daily. 04/12/24   Rancour, Garnette, MD  finasteride  (PROSCAR ) 5 MG tablet Take 1 tablet (5 mg total) by mouth daily. 03/29/23   Samtani, Jai-Gurmukh, MD  HYDROcodone -acetaminophen  (NORCO/VICODIN) 5-325 MG tablet Take 1-2 tablets by mouth every 4 (four) hours as needed for moderate pain. 03/28/23   Samtani, Jai-Gurmukh, MD  levothyroxine  (SYNTHROID ) 25 MCG tablet Take 25 mcg by mouth daily before breakfast. 07/06/21   [provider]  magnesium  hydroxide (MILK OF MAGNESIA) 400 MG/5ML suspension Take 30 mLs by mouth daily as needed for moderate constipation or heartburn.    [provider]  omeprazole  (PRILOSEC  OTC) 20 MG tablet Take 20 mg by mouth daily.    [provider]  tamsulosin  (FLOMAX ) 0.4 MG CAPS capsule Take 1 capsule (0.4 mg) by mouth daily after supper. 03/28/23   Samtani, Jai-Gurmukh, MD  vitamin B-12 (CYANOCOBALAMIN) 1000 MCG tablet Take 1,000 mcg by mouth daily.    [provider]                                                                                                                                     Allergies Codeine, Morphine and codeine, and Sulfa antibiotics  Review of Systems Review of Systems As noted in HPI  Physical Exam Vital Signs  I have reviewed the triage vital signs BP 135/76   Pulse 86   Temp 97.9 F (36.6 C) (Oral)   Resp 14   Ht 6' 2 (1.88 m)   Wt 110.7 kg   SpO2 96%   BMI 31.33 kg/m   Physical Exam Vitals reviewed.  Constitutional:      General: He is not in acute distress.    Appearance: He is well-developed. He is not diaphoretic.  HENT:     Head: Normocephalic and atraumatic.     Right Ear: External ear normal.     Left Ear: External ear normal.     Nose: Nose normal.     Mouth/Throat:     Mouth: Mucous membranes are moist.  Eyes:     General: No scleral icterus.    Conjunctiva/sclera: Conjunctivae normal.  Neck:     Trachea: Phonation normal.  Cardiovascular:     Rate and Rhythm: Normal rate and regular rhythm.  Pulmonary:     Effort: Pulmonary effort is normal. No respiratory distress.     Breath sounds: No stridor.  Abdominal:     General: There is no distension.   Musculoskeletal:        General: Normal range of motion.     Cervical back: Normal range of motion.  Neurological:     Mental Status: He is alert. Mental status is at baseline.  Psychiatric:        Behavior: Behavior normal.     ED Results and Treatments Labs (all labs ordered are listed, but only abnormal results are displayed) Labs Reviewed - No data to display  EKG  EKG Interpretation Date/Time:    Ventricular Rate:    PR Interval:    QRS Duration:    QT Interval:    QTC Calculation:   R Axis:      Text Interpretation:         Radiology No results found.  Medications Ordered in ED Medications - No data to display Procedures Ultrasound ED Renal  Date/Time: 06/22/2024 5:18 AM  Performed by:  Trine Raynell Moder, MD Authorized by: Trine Raynell Moder, MD   Procedure details:    Indications: urinary retention     Technique:  BladderImages: archived Bladder findings:    Bladder:  Visualized   Volume:  315cc SUPRAPUBIC TUBE PLACEMENT  Date/Time: 06/22/2024 5:18 AM  Performed by: Trine Raynell Moder, MD Authorized by: Trine Raynell Moder, MD   Consent:    Consent obtained:  Emergent situation   Consent given by:  Patient   Risks discussed:  Bleeding, infection and pain Universal protocol:    Patient identity confirmed:  Arm band Sedation:    Sedation type:  None Anesthesia:    Anesthesia method:  None Procedure details:    Complexity:  Simple   Catheter type:  Foley   Catheter size:  14 Fr   Ultrasound guidance: no     Number of attempts:  1   Urine characteristics:  Mildly cloudy Post-procedure details:    Procedure completion:  Tolerated   (including critical care time) Medical Decision Making / ED Course   Medical Decision Making   Bedside ultrasound notable for 300 cc in the bladder.  Suprapubic catheter replaced at bedside.    Final Clinical Impression(s) / ED Diagnoses Final diagnoses:  Problem with Foley catheter, initial encounter [facility])   The patient appears reasonably screened and/or stabilized for discharge and I doubt any other medical condition or other [facility] requiring further screening, evaluation, or treatment in the ED at this time. I have discussed the findings, Dx and Tx plan with the patient/family who expressed understanding and agree(s) with the plan. Discharge instructions discussed at length. The patient/family was given strict return precautions who verbalized understanding of the instructions. No further questions at time of discharge.  Disposition: Discharge  Condition: Good  ED Discharge Orders     None       This chart was dictated using voice recognition software.  Despite best efforts to proofread,  errors can  occur which can change the documentation meaning.    Trine Raynell Moder, MD 06/22/24 [phone]

## 2024-06-22 NOTE — ED Triage Notes (Signed)
 Pt arrives from [facility] w/ c/o obstruction of suprapubic cath when trying to replace at facility today. C/o abd pain.

## 2024-06-22 NOTE — ED Notes (Signed)
 Called report to [facility]

## 2024-06-28 DIAGNOSIS — R0602 Shortness of breath: Secondary | ICD-10-CM | POA: Diagnosis not present

## 2024-06-29 DIAGNOSIS — E559 Vitamin D deficiency, unspecified: Secondary | ICD-10-CM | POA: Diagnosis not present

## 2024-07-05 DIAGNOSIS — R338 Other retention of urine: Secondary | ICD-10-CM | POA: Diagnosis not present

## 2024-07-09 DIAGNOSIS — L853 Xerosis cutis: Secondary | ICD-10-CM | POA: Diagnosis not present

## 2024-07-09 DIAGNOSIS — L602 Onychogryphosis: Secondary | ICD-10-CM | POA: Diagnosis not present

## 2024-07-09 DIAGNOSIS — I739 Peripheral vascular disease, unspecified: Secondary | ICD-10-CM | POA: Diagnosis not present

## 2024-07-10 DIAGNOSIS — F0390 Unspecified dementia without behavioral disturbance: Secondary | ICD-10-CM | POA: Diagnosis not present

## 2024-07-10 DIAGNOSIS — F331 Major depressive disorder, recurrent, moderate: Secondary | ICD-10-CM | POA: Diagnosis not present

## 2024-07-10 DIAGNOSIS — F0393 Unspecified dementia, unspecified severity, with mood disturbance: Secondary | ICD-10-CM | POA: Diagnosis not present

## 2024-07-10 DIAGNOSIS — Z515 Encounter for palliative care: Secondary | ICD-10-CM | POA: Diagnosis not present

## 2024-07-17 DIAGNOSIS — Z23 Encounter for immunization: Secondary | ICD-10-CM | POA: Diagnosis not present

## 2024-07-31 DIAGNOSIS — Z23 Encounter for immunization: Secondary | ICD-10-CM | POA: Diagnosis not present

## 2024-08-05 DIAGNOSIS — F0390 Unspecified dementia without behavioral disturbance: Secondary | ICD-10-CM | POA: Diagnosis not present

## 2024-08-05 DIAGNOSIS — F331 Major depressive disorder, recurrent, moderate: Secondary | ICD-10-CM | POA: Diagnosis not present

## 2024-08-05 DIAGNOSIS — Z79899 Other long term (current) drug therapy: Secondary | ICD-10-CM | POA: Diagnosis not present

## 2024-08-05 DIAGNOSIS — F0393 Unspecified dementia, unspecified severity, with mood disturbance: Secondary | ICD-10-CM | POA: Diagnosis not present

## 2024-08-13 DIAGNOSIS — R2681 Unsteadiness on feet: Secondary | ICD-10-CM | POA: Diagnosis not present

## 2024-08-13 DIAGNOSIS — M6282 Rhabdomyolysis: Secondary | ICD-10-CM | POA: Diagnosis not present

## 2024-08-13 DIAGNOSIS — M6281 Muscle weakness (generalized): Secondary | ICD-10-CM | POA: Diagnosis not present

## 2024-08-14 DIAGNOSIS — M6282 Rhabdomyolysis: Secondary | ICD-10-CM | POA: Diagnosis not present

## 2024-08-14 DIAGNOSIS — M6281 Muscle weakness (generalized): Secondary | ICD-10-CM | POA: Diagnosis not present

## 2024-08-14 DIAGNOSIS — R2681 Unsteadiness on feet: Secondary | ICD-10-CM | POA: Diagnosis not present

## 2024-08-15 DIAGNOSIS — M6282 Rhabdomyolysis: Secondary | ICD-10-CM | POA: Diagnosis not present

## 2024-08-15 DIAGNOSIS — M6281 Muscle weakness (generalized): Secondary | ICD-10-CM | POA: Diagnosis not present

## 2024-08-15 DIAGNOSIS — R2681 Unsteadiness on feet: Secondary | ICD-10-CM | POA: Diagnosis not present

## 2024-08-19 DIAGNOSIS — M6282 Rhabdomyolysis: Secondary | ICD-10-CM | POA: Diagnosis not present

## 2024-08-19 DIAGNOSIS — R2681 Unsteadiness on feet: Secondary | ICD-10-CM | POA: Diagnosis not present

## 2024-08-19 DIAGNOSIS — M6281 Muscle weakness (generalized): Secondary | ICD-10-CM | POA: Diagnosis not present

## 2024-08-20 DIAGNOSIS — M6282 Rhabdomyolysis: Secondary | ICD-10-CM | POA: Diagnosis not present

## 2024-08-20 DIAGNOSIS — M6281 Muscle weakness (generalized): Secondary | ICD-10-CM | POA: Diagnosis not present

## 2024-08-20 DIAGNOSIS — R2681 Unsteadiness on feet: Secondary | ICD-10-CM | POA: Diagnosis not present

## 2024-08-23 DIAGNOSIS — R2681 Unsteadiness on feet: Secondary | ICD-10-CM | POA: Diagnosis not present

## 2024-08-23 DIAGNOSIS — M6282 Rhabdomyolysis: Secondary | ICD-10-CM | POA: Diagnosis not present

## 2024-08-23 DIAGNOSIS — M6281 Muscle weakness (generalized): Secondary | ICD-10-CM | POA: Diagnosis not present

## 2024-08-26 DIAGNOSIS — M6282 Rhabdomyolysis: Secondary | ICD-10-CM | POA: Diagnosis not present

## 2024-08-26 DIAGNOSIS — R2681 Unsteadiness on feet: Secondary | ICD-10-CM | POA: Diagnosis not present

## 2024-08-26 DIAGNOSIS — M6281 Muscle weakness (generalized): Secondary | ICD-10-CM | POA: Diagnosis not present

## 2024-08-27 DIAGNOSIS — R2681 Unsteadiness on feet: Secondary | ICD-10-CM | POA: Diagnosis not present

## 2024-08-27 DIAGNOSIS — M6282 Rhabdomyolysis: Secondary | ICD-10-CM | POA: Diagnosis not present

## 2024-08-27 DIAGNOSIS — M6281 Muscle weakness (generalized): Secondary | ICD-10-CM | POA: Diagnosis not present

## 2024-08-29 DIAGNOSIS — R2681 Unsteadiness on feet: Secondary | ICD-10-CM | POA: Diagnosis not present

## 2024-08-29 DIAGNOSIS — M6281 Muscle weakness (generalized): Secondary | ICD-10-CM | POA: Diagnosis not present

## 2024-08-29 DIAGNOSIS — M6282 Rhabdomyolysis: Secondary | ICD-10-CM | POA: Diagnosis not present

## 2024-09-02 DIAGNOSIS — M6282 Rhabdomyolysis: Secondary | ICD-10-CM | POA: Diagnosis not present

## 2024-09-02 DIAGNOSIS — R2681 Unsteadiness on feet: Secondary | ICD-10-CM | POA: Diagnosis not present

## 2024-09-02 DIAGNOSIS — M6281 Muscle weakness (generalized): Secondary | ICD-10-CM | POA: Diagnosis not present

## 2024-09-03 DIAGNOSIS — M6281 Muscle weakness (generalized): Secondary | ICD-10-CM | POA: Diagnosis not present

## 2024-09-03 DIAGNOSIS — R2681 Unsteadiness on feet: Secondary | ICD-10-CM | POA: Diagnosis not present

## 2024-09-03 DIAGNOSIS — M6282 Rhabdomyolysis: Secondary | ICD-10-CM | POA: Diagnosis not present

## 2024-09-05 DIAGNOSIS — M6282 Rhabdomyolysis: Secondary | ICD-10-CM | POA: Diagnosis not present

## 2024-09-05 DIAGNOSIS — M6281 Muscle weakness (generalized): Secondary | ICD-10-CM | POA: Diagnosis not present

## 2024-09-05 DIAGNOSIS — R2681 Unsteadiness on feet: Secondary | ICD-10-CM | POA: Diagnosis not present

## 2024-09-09 DIAGNOSIS — M6281 Muscle weakness (generalized): Secondary | ICD-10-CM | POA: Diagnosis not present

## 2024-09-09 DIAGNOSIS — M6282 Rhabdomyolysis: Secondary | ICD-10-CM | POA: Diagnosis not present

## 2024-09-09 DIAGNOSIS — R2681 Unsteadiness on feet: Secondary | ICD-10-CM | POA: Diagnosis not present

## 2024-09-10 DIAGNOSIS — F331 Major depressive disorder, recurrent, moderate: Secondary | ICD-10-CM | POA: Diagnosis not present

## 2024-09-10 DIAGNOSIS — M6281 Muscle weakness (generalized): Secondary | ICD-10-CM | POA: Diagnosis not present

## 2024-09-10 DIAGNOSIS — R2681 Unsteadiness on feet: Secondary | ICD-10-CM | POA: Diagnosis not present

## 2024-09-10 DIAGNOSIS — M6282 Rhabdomyolysis: Secondary | ICD-10-CM | POA: Diagnosis not present

## 2024-09-10 DIAGNOSIS — F0393 Unspecified dementia, unspecified severity, with mood disturbance: Secondary | ICD-10-CM | POA: Diagnosis not present

## 2024-09-19 DIAGNOSIS — R339 Retention of urine, unspecified: Secondary | ICD-10-CM | POA: Diagnosis not present

## 2024-09-19 DIAGNOSIS — N319 Neuromuscular dysfunction of bladder, unspecified: Secondary | ICD-10-CM | POA: Diagnosis not present

## 2024-09-25 DIAGNOSIS — L603 Nail dystrophy: Secondary | ICD-10-CM | POA: Diagnosis not present

## 2024-09-25 DIAGNOSIS — L602 Onychogryphosis: Secondary | ICD-10-CM | POA: Diagnosis not present

## 2024-09-25 DIAGNOSIS — I739 Peripheral vascular disease, unspecified: Secondary | ICD-10-CM | POA: Diagnosis not present
